# Patient Record
Sex: Male | Born: 1935 | Race: White | Hispanic: No | Smoking: Former smoker
Health system: Southern US, Community
[De-identification: ages and names within clinical notes are randomized; demographics above are authoritative.]

## PROBLEM LIST (undated history)

## (undated) DIAGNOSIS — E785 Hyperlipidemia, unspecified: Secondary | ICD-10-CM

## (undated) DIAGNOSIS — E039 Hypothyroidism, unspecified: Secondary | ICD-10-CM

## (undated) DIAGNOSIS — I10 Essential (primary) hypertension: Secondary | ICD-10-CM

## (undated) DIAGNOSIS — N400 Enlarged prostate without lower urinary tract symptoms: Secondary | ICD-10-CM

## (undated) DIAGNOSIS — H5440 Blindness, one eye, unspecified eye: Secondary | ICD-10-CM

## (undated) HISTORY — PX: JOINT REPLACEMENT: SHX530

## (undated) HISTORY — PX: HERNIA REPAIR: SHX51

## (undated) HISTORY — PX: OTHER SURGICAL HISTORY: SHX169

---

## 1999-11-16 ENCOUNTER — Ambulatory Visit (HOSPITAL_COMMUNITY): Admission: RE | Admit: 1999-11-16 | Discharge: 1999-11-16 | Payer: Self-pay | Admitting: Specialist

## 1999-12-28 ENCOUNTER — Encounter: Payer: Self-pay | Admitting: *Deleted

## 1999-12-28 ENCOUNTER — Inpatient Hospital Stay (HOSPITAL_COMMUNITY): Admission: EM | Admit: 1999-12-28 | Discharge: 1999-12-29 | Payer: Self-pay | Admitting: *Deleted

## 1999-12-29 ENCOUNTER — Encounter: Payer: Self-pay | Admitting: Internal Medicine

## 2000-11-24 ENCOUNTER — Ambulatory Visit (HOSPITAL_COMMUNITY): Admission: RE | Admit: 2000-11-24 | Discharge: 2000-11-24 | Payer: Self-pay | Admitting: *Deleted

## 2000-11-24 ENCOUNTER — Encounter (INDEPENDENT_AMBULATORY_CARE_PROVIDER_SITE_OTHER): Payer: Self-pay | Admitting: Specialist

## 2003-05-28 ENCOUNTER — Ambulatory Visit (HOSPITAL_COMMUNITY): Admission: RE | Admit: 2003-05-28 | Discharge: 2003-05-28 | Payer: Self-pay | Admitting: *Deleted

## 2003-05-28 ENCOUNTER — Encounter (INDEPENDENT_AMBULATORY_CARE_PROVIDER_SITE_OTHER): Payer: Self-pay | Admitting: Specialist

## 2004-12-29 ENCOUNTER — Emergency Department (HOSPITAL_COMMUNITY): Admission: EM | Admit: 2004-12-29 | Discharge: 2004-12-29 | Payer: Self-pay | Admitting: Emergency Medicine

## 2005-01-07 ENCOUNTER — Emergency Department (HOSPITAL_COMMUNITY): Admission: EM | Admit: 2005-01-07 | Discharge: 2005-01-07 | Payer: Self-pay | Admitting: Emergency Medicine

## 2005-08-24 ENCOUNTER — Ambulatory Visit (HOSPITAL_COMMUNITY): Admission: RE | Admit: 2005-08-24 | Discharge: 2005-08-24 | Payer: Self-pay | Admitting: *Deleted

## 2005-08-24 ENCOUNTER — Encounter (INDEPENDENT_AMBULATORY_CARE_PROVIDER_SITE_OTHER): Payer: Self-pay | Admitting: *Deleted

## 2007-08-29 ENCOUNTER — Inpatient Hospital Stay (HOSPITAL_COMMUNITY): Admission: RE | Admit: 2007-08-29 | Discharge: 2007-09-01 | Payer: Self-pay | Admitting: Orthopedic Surgery

## 2008-01-09 ENCOUNTER — Ambulatory Visit (HOSPITAL_COMMUNITY): Admission: RE | Admit: 2008-01-09 | Discharge: 2008-01-09 | Payer: Self-pay | Admitting: *Deleted

## 2009-10-14 ENCOUNTER — Inpatient Hospital Stay (HOSPITAL_COMMUNITY)
Admission: RE | Admit: 2009-10-14 | Discharge: 2009-10-16 | Payer: Self-pay | Source: Home / Self Care | Admitting: Orthopedic Surgery

## 2010-09-11 ENCOUNTER — Other Ambulatory Visit (HOSPITAL_COMMUNITY): Payer: Medicare Other

## 2010-09-11 ENCOUNTER — Ambulatory Visit (HOSPITAL_COMMUNITY)
Admission: RE | Admit: 2010-09-11 | Discharge: 2010-09-11 | Disposition: A | Discharge status: Home / Self Care | Payer: Medicare Other | Source: Ambulatory Visit | Attending: Orthopedic Surgery | Admitting: Orthopedic Surgery

## 2010-09-11 DIAGNOSIS — Z01818 Encounter for other preprocedural examination: Secondary | ICD-10-CM | POA: Insufficient documentation

## 2010-09-11 LAB — DIFFERENTIAL
Basophils Relative: 0 % (ref 0–1)
Eosinophils Absolute: 0.1 10*3/uL (ref 0.0–0.7)
Eosinophils Relative: 1 % (ref 0–5)
Monocytes Absolute: 0.6 10*3/uL (ref 0.1–1.0)
Monocytes Relative: 11 % (ref 3–12)

## 2010-09-11 LAB — BASIC METABOLIC PANEL
CO2: 30 mEq/L (ref 19–32)
Chloride: 105 mEq/L (ref 96–112)
Creatinine, Ser: 0.74 mg/dL (ref 0.4–1.5)
GFR calc Af Amer: >60 mL/min (ref >60)

## 2010-09-11 LAB — URINALYSIS, ROUTINE W REFLEX MICROSCOPIC
Ketones, ur: NEGATIVE mg/dL
Protein, ur: NEGATIVE mg/dL
Urine Glucose, Fasting: NEGATIVE mg/dL

## 2010-09-11 LAB — CBC
Hemoglobin: 13 g/dL (ref 13.0–17.0)
MCH: 29.5 pg (ref 26.0–34.0)
MCHC: 32.3 g/dL (ref 30.0–36.0)
Platelets: 204 10*3/uL (ref 150–400)

## 2010-09-11 LAB — SURGICAL PCR SCREEN: MRSA, PCR: NEGATIVE

## 2010-09-22 ENCOUNTER — Inpatient Hospital Stay (HOSPITAL_COMMUNITY): Payer: Medicare Other

## 2010-09-22 ENCOUNTER — Inpatient Hospital Stay (HOSPITAL_COMMUNITY)
Admission: RE | Admit: 2010-09-22 | Discharge: 2010-09-24 | DRG: 470 | Disposition: A | Discharge status: Home Health | Payer: Medicare Other | Attending: Orthopedic Surgery | Admitting: Orthopedic Surgery

## 2010-09-22 DIAGNOSIS — Z96659 Presence of unspecified artificial knee joint: Secondary | ICD-10-CM

## 2010-09-22 DIAGNOSIS — E119 Type 2 diabetes mellitus without complications: Secondary | ICD-10-CM | POA: Diagnosis present

## 2010-09-22 DIAGNOSIS — R5082 Postprocedural fever: Secondary | ICD-10-CM | POA: Diagnosis not present

## 2010-09-22 DIAGNOSIS — E039 Hypothyroidism, unspecified: Secondary | ICD-10-CM | POA: Diagnosis present

## 2010-09-22 DIAGNOSIS — E7800 Pure hypercholesterolemia, unspecified: Secondary | ICD-10-CM | POA: Diagnosis present

## 2010-09-22 DIAGNOSIS — M169 Osteoarthritis of hip, unspecified: Principal | ICD-10-CM | POA: Diagnosis present

## 2010-09-22 DIAGNOSIS — M1610 Unilateral primary osteoarthritis, unspecified hip: Principal | ICD-10-CM | POA: Diagnosis present

## 2010-09-22 DIAGNOSIS — I10 Essential (primary) hypertension: Secondary | ICD-10-CM | POA: Diagnosis present

## 2010-09-22 LAB — TYPE AND SCREEN
ABO/RH(D): A POS
Antibody Screen: NEGATIVE

## 2010-09-22 LAB — GLUCOSE, CAPILLARY: Glucose-Capillary: 98 mg/dL (ref 70–99)

## 2010-09-23 LAB — GLUCOSE, CAPILLARY
Glucose-Capillary: 292 mg/dL — ABNORMAL HIGH (ref 70–99)
Glucose-Capillary: 116 mg/dL — ABNORMAL HIGH (ref 70–99)

## 2010-09-23 LAB — BASIC METABOLIC PANEL
BUN: 13 mg/dL (ref 6–23)
Calcium: 8.3 mg/dL — ABNORMAL LOW (ref 8.4–10.5)
GFR calc non Af Amer: >60 mL/min (ref >60)
Glucose, Bld: 117 mg/dL — ABNORMAL HIGH (ref 70–99)

## 2010-09-23 LAB — CBC
HCT: 31.5 % — ABNORMAL LOW (ref 39.0–52.0)
MCH: 29.5 pg (ref 26.0–34.0)
MCHC: 32.4 g/dL (ref 30.0–36.0)
MCV: 91 fL (ref 78.0–100.0)
RDW: 13.9 % (ref 11.5–15.5)

## 2010-09-24 LAB — BASIC METABOLIC PANEL
BUN: 14 mg/dL (ref 6–23)
Calcium: 8.4 mg/dL (ref 8.4–10.5)
GFR calc non Af Amer: >60 mL/min (ref >60)
Potassium: 4.5 mEq/L (ref 3.5–5.1)
Sodium: 140 mEq/L (ref 135–145)

## 2010-09-24 LAB — CBC
MCHC: 32.8 g/dL (ref 30.0–36.0)
MCV: 91.1 fL (ref 78.0–100.0)
Platelets: 164 10*3/uL (ref 150–400)
RDW: 13.9 % (ref 11.5–15.5)
WBC: 8.7 10*3/uL (ref 4.0–10.5)

## 2010-09-24 LAB — GLUCOSE, CAPILLARY: Glucose-Capillary: 125 mg/dL — ABNORMAL HIGH (ref 70–99)

## 2010-09-28 NOTE — Op Note (Signed)
NAME:  Gene Hunter, Gene Hunter                ACCOUNT NO.:  [id number]  MEDICAL RECORD NO.:  [id number]           PATIENT TYPE:  I  LOCATION:  1619                         FACILITY:  [facility]  PHYSICIAN:  Madlyn Frankel. Charlann Boxer, M.D.  DATE OF BIRTH:  [birth year]  DATE OF PROCEDURE:  09/22/2010 DATE OF DISCHARGE:                              OPERATIVE REPORT   PREOPERATIVE DIAGNOSIS:  Right hip osteoarthritis.  POSTOPERATIVE DIAGNOSIS:  Right hip osteoarthritis.  PROCEDURE:  Right total hip replacement through an anterior approach.  COMPONENTS USED:  DePuy hip system size 54 pinnacle cup, 36 +4 AltrX liner, size 7 high Tri-Lock stem with 36 -2 sphere metal ball.  SURGEON:  Madlyn Frankel. Charlann Boxer, M.D.  ASSISTANT:  Nelia Shi. Webb Silversmith, nurse practitioner  ANESTHESIA:  General.  SPECIMENS:  None.  COMPLICATIONS:  None.  DRAINS:  One Hemovac.  BLOOD LOSS:  Approximately 500 cc.  INDICATIONS FOR PROCEDURE:  Mr. Papadopoulos is an 77 year old patient with known history of previous left total hip replacement that he has done very well with.  He had presented with recurring problems with his right hip similar to his left.  After recognizing the failure to respond to conservative measures, he wished at this point to proceed with hip replacement surgery.  Risks and benefits were reviewed in particular regarding the comparison of the anterior versus posterior approach. Consent was obtained for the benefit of pain relief.  PROCEDURE IN DETAIL:  The patient was brought to operative theater. Once adequate anesthesia, preoperative antibiotics, Ancef administered, the patient was positioned supine on the OSI Hana table.  The right lower extremity was prepped and draped from proximal to the iliac crest to the knee for anterolateral approach.  Fluoroscopy was brought to the field identifying landmarks and positioning.  A time-out was performed identifying the patient, planned procedure and the extremity.  At this  point, an incision was made 2 cm lateral to the anterior superior iliac spine extending anteriorly over the greater trochanter. A soft tissue plane was created and protractor placed.  The fascia of the tensor fascia lata muscle was incised, elevated, and the muscles swept laterally.  Cobb retractor was placed along the superior neck. Using hip retractors, I cauterized the anterior circumflex vessels and removed the peri-capsule anterior fat and placed another Cobb retractor over the inferior neck.  I then elevated the rectus femoris off the anterior acetabulum and then made a capsular osteotomy superior neck down to the trochanteric fossa and extended it down to the trochanteric groove.  Tag sutures were placed in the retractors and placed inside the capsule, at this point identified the position for a neck cut. Utilizing fluoroscopy, we confirmed this cut location.  Traction was applied and neck osteotomy made and the femoral head was then removed.  Traction was removed and slight internal rotation was applied. Retractor was placed anterior and posterior.  Labrum was debrided, foveal tissue identified and I began reaming with a 48 reamer in the central reaming spot.  Once I began my initial reaming, I brought the fluoroscopy in to confirm the location of reaming.  I reamed up to  53 reamer and chose a 54 cup.  The radiographic confirmation was utilized for reaming as well as final cup position.  The final cup was then impacted, a single cancellous screw placed in a hole and later the final 36 +4 neutral AltrX liner.  Attention was now directed to the femur.  The femur was first externally rotated to about 60 degrees allowing to release of the inferior capsule, then I rotated around to 120 degrees, releasing some of the posterior muscular structures to allow for retractor placement manually.  A retractor was then placed proximally over the trochanter.  I then used a box osteotome to open  up the proximal femur removing some of bone laterally to prevent any varus position of stem with the pituitary rongeur.  I began broaching using the Tri-Lock system.  With the initial broach, I did confirm the location radiographically.  Retractors were then placed and I finished broaching, went up to size 7 broach which sat 1 mm or so above my neck cut.  I did a trial reduction in high offset neck.  I had performed a contralateral hip with a size 8 high Tri-Lock stem, high offset Tri-Lock stem.  Trial reduction was carried out and when I looked at it radiographically, the right side, I feel that my cup position was a little bit lower down in the pelvis than the left hip and my lower neck cut can allow for position and appropriate leg lengths. Once I was satisfied with the stability of hip through range of motion as well as radiographically, the trial components were removed and final 7 high Tri-Lock stem was chosen.  It was subsequent impacted and sat at the level where my broach was.  Based on this and my trial reduction, I chose a 36 -2 ball.  It was impacted on a clean and dry trunnion and the hip reduced.  Hip had been irrigated throughout the case and again at this point, I reapproximated the anterior capsular tissue using #1 Vicryl.  I then placed a medium Hemovac drain for documentation of blood loss postoperative.  The patient received tranexamic acid perioperatively. The fascia of the tensor fascia lata was then reapproximated using #1 Vicryl.  The remaining wound was closed with 2-0 Vicryl and a running 4- 0 Monocryl.  The hip was cleaned, dried and dressed sterilely with Octylseal sealant and Aquacel dressing.  The drain site was dressed separately.  He was then extubated and brought to recovery room in stable condition tolerating the procedure well.     Madlyn Frankel Charlann Boxer, M.D.     MDO/MEDQ  D:  09/22/2010  T:  09/22/2010  Job:  [id number]  Electronically Signed by  Durene Romans M.D. on 09/28/2010 10:50:13 AM

## 2010-10-02 NOTE — Discharge Summary (Signed)
  NAME:  PAO, HAFFEY                ACCOUNT NO.:  [id number]  MEDICAL RECORD NO.:  [id number]           PATIENT TYPE:  I  LOCATION:  1619                         FACILITY:  [facility]  PHYSICIAN:  Madlyn Frankel. Charlann Boxer, M.D.  DATE OF BIRTH:  [birth year]  DATE OF ADMISSION:  09/22/2010 DATE OF DISCHARGE:  09/24/2010                              DISCHARGE SUMMARY   HISTORY:  This is a patient seen in followup some time for right hip pain.  He had failed conservative treatment, was brought to the hospital on the 14th and underwent a right hip arthroplasty.  ADMISSION DIAGNOSIS:  Right hip osteoarthritis.  DISCHARGE DIAGNOSIS:  Right hip osteoarthritis status post arthroplasty, hypertension, high cholesterol, diabetes.  DISCHARGE CONDITION:  Good.  His labs have been stable.  Today his hemoglobin and hematocrit are 10.4 and 31.7.  He is afebrile.  His vital signs are stable.  His wound is checked, it is clean and dry.  He has Aquacel dressing in place, he needs to remove that.  After 8 days, he can shower.  DISCHARGE INSTRUCTIONS:  Were given.  He knows he can shower, home health physical therapy.  DISCHARGE MEDICATIONS:  Are as follows: 1. Acetaminophen 325 mg two every 4 hours as needed. 2. Colace 100 mg twice daily. 3. Ferrous sulfate 325 mg three times a day. 4. Hydrocodone 7.5/325 mg one to two p.o. q.4-6 hours. 5. Methocarbamol 500 mg every 6 hours as needed. 6. MiraLax 17 g a day as needed. 7. Xarelto 10 mg a day. 8. Aspirin 81 mg a day after the Xarelto is finished. 9. Atorvastatin 20 mg every other day. 10.Levoxyl 100 mcg every morning. 11.Lisinopril 10 mg a day. 12.Metformin 500 mg at bedtime. 13.Multivitamins daily. 14.Refresh Tears as needed. 15.Amlodipine 5 mg at bedtime.     Russell L. Webb Silversmith, RN   ______________________________ Madlyn Frankel Charlann Boxer, M.D.    RLW/MEDQ  D:  09/24/2010  T:  09/25/2010  Job:  [id number]  Electronically Signed by Lauree Chandler NP-C on  10/01/2010 01:22:07 PM Electronically Signed by Durene Romans M.D. on 10/02/2010 07:03:21 AM

## 2010-10-29 NOTE — H&P (Signed)
NAME:  Gene Hunter, Gene Hunter                ACCOUNT NO.:  [id number]  MEDICAL RECORD NO.:  [id number]          PATIENT TYPE:  INP  LOCATION:  NA                           FACILITY:  [facility]  PHYSICIAN:  Madlyn Frankel. Charlann Boxer, M.D.  DATE OF BIRTH:  Nov 04, 1925  DATE OF ADMISSION: DATE OF DISCHARGE:                             HISTORY & PHYSICAL   ADMISSION DIAGNOSIS:  Right hip osteoarthritis.  BRIEF HISTORY:  This is a patient seen and followed here for some time. He has had surgery with Dr. Charlann Boxer before and after failed conservative treatment has decided to move forward with right hip arthroplasty anterior approach scheduled for February 14.  PAST MEDICAL HISTORY:  Significant for hypertension.  He has high cholesterol and diabetes, which is well-controlled.  PAST SURGICAL HISTORY:  He gives a surgical history of having left eye surgery in 1970.  He had a hernia four different times on the left side. He had a right knee replacement in 2010.  CURRENT MEDICATIONS: 1. Vitamins daily. 2. Aspirin 81 mg every other day. 3. Zolpidem every night. 4. Lisinopril 10 mg a day. 5. Avastin 20 mg a day. 6. Metformin 500 mg every night. 7. Levoxyl 100 mg every day.  ALLERGIES:  HE DOES NOT DESCRIBE ANY MEDICINE ALLERGIES.  SOCIAL HISTORY:  The patient is widowed.  He lives alone.  He is retired from the [employer].  His disposition plan is for home with his daughter after surgery.  He has no history of alcohol or drug abuse.  He does have a distant history of smoking.  The patient has two children.  FAMILY HISTORY:  His father died in 45 from heart problems.  His mother died in 74 from a stroke.  REVIEW OF SYSTEMS:  Notable for those difficulties described in the history of present illness and past medical history.  His review of systems sheet is otherwise unremarkable.  PHYSICAL EXAMINATION:  VITAL SIGNS:  The patient is 5 feet 11 inches, 167 pounds.  His blood pressure is 110/60.  His  respiration rate is 20. His pulse is 68. GENERAL HEALTH:  Fair.  He does have diabetes. HEENT:  Shows him to be normocephalic.  He is blind in his left eye. NECK:  Within normal limits. CHEST:  Clear to auscultation bilaterally. HEART:  He has S1, S2.  He has no murmurs, rubs or gallops heard.  He does have a history of hypertension and high cholesterol. ABDOMEN:  Soft and nondistended. GENITOURINARY:  Unremarkable. EXTREMITIES:  Exam shows osteoarthritis. DERMATOLOGIC:  He is intact. NEUROLOGIC:  He is intact.  LABORATORY DATA:  Pending through [facility].  RADIOLOGIC:  EKG and chest x-ray are pending through [facility].  IMPRESSION:  Right hip osteoarthritis.  PLAN:  He will be admitted on February 14 for a right hip anterior approach arthroplasty with Dr. Charlann Boxer.  DISCHARGE MEDICATIONS:  Including Xarelto, Robaxin, MiraLax, Colace and iron were given to him today.  His pain medicine will be given to him at discharge.     Russell L. Blima Dessert   ______________________________ Madlyn Frankel Charlann Boxer, M.D.    RLW/MEDQ  D:  09/10/2010  T:  09/10/2010  Job:  [id number]  Electronically Signed by Lauree Chandler NP-C on 10/29/2010 10:12:37 AM Electronically Signed by Durene Romans M.D. on 10/29/2010 01:31:10 PM

## 2010-11-01 LAB — DIFFERENTIAL
Basophils Relative: 0 % (ref 0–1)
Eosinophils Absolute: 0.2 10*3/uL (ref 0.0–0.7)
Eosinophils Relative: 3 % (ref 0–5)
Neutrophils Relative %: 63 % (ref 43–77)

## 2010-11-01 LAB — GLUCOSE, CAPILLARY
Glucose-Capillary: 103 mg/dL — ABNORMAL HIGH (ref 70–99)
Glucose-Capillary: 108 mg/dL — ABNORMAL HIGH (ref 70–99)
Glucose-Capillary: 124 mg/dL — ABNORMAL HIGH (ref 70–99)
Glucose-Capillary: 137 mg/dL — ABNORMAL HIGH (ref 70–99)
Glucose-Capillary: 90 mg/dL (ref 70–99)
Glucose-Capillary: 97 mg/dL (ref 70–99)

## 2010-11-01 LAB — PROTIME-INR: Prothrombin Time: 13.4 seconds (ref 11.6–15.2)

## 2010-11-01 LAB — BASIC METABOLIC PANEL
BUN: 16 mg/dL (ref 6–23)
BUN: 31 mg/dL — ABNORMAL HIGH (ref 6–23)
CO2: 29 mEq/L (ref 19–32)
CO2: 30 mEq/L (ref 19–32)
Calcium: 8 mg/dL — ABNORMAL LOW (ref 8.4–10.5)
Chloride: 102 mEq/L (ref 96–112)
Chloride: 105 mEq/L (ref 96–112)
Chloride: 108 mEq/L (ref 96–112)
Creatinine, Ser: 0.38 mg/dL — ABNORMAL LOW (ref 0.4–1.5)
Creatinine, Ser: 0.8 mg/dL (ref 0.4–1.5)
Creatinine, Ser: 0.92 mg/dL (ref 0.4–1.5)
GFR calc Af Amer: >60 mL/min (ref >60)
GFR calc Af Amer: >60 mL/min (ref >60)
GFR calc non Af Amer: >60 mL/min (ref >60)
Glucose, Bld: 100 mg/dL — ABNORMAL HIGH (ref 70–99)
Potassium: 4.3 mEq/L (ref 3.5–5.1)
Potassium: 5.4 mEq/L — ABNORMAL HIGH (ref 3.5–5.1)

## 2010-11-01 LAB — CBC
HCT: 29.7 % — ABNORMAL LOW (ref 39.0–52.0)
HCT: 39.6 % (ref 39.0–52.0)
Hemoglobin: 9.8 g/dL — ABNORMAL LOW (ref 13.0–17.0)
MCHC: 32.9 g/dL (ref 30.0–36.0)
MCHC: 33.5 g/dL (ref 30.0–36.0)
MCV: 90.5 fL (ref 78.0–100.0)
MCV: 91.8 fL (ref 78.0–100.0)
MCV: 92 fL (ref 78.0–100.0)
Platelets: 147 10*3/uL — ABNORMAL LOW (ref 150–400)
Platelets: 236 10*3/uL (ref 150–400)
RBC: 2.58 MIL/uL — ABNORMAL LOW (ref 4.22–5.81)
RBC: 3.23 MIL/uL — ABNORMAL LOW (ref 4.22–5.81)
RDW: 14.1 % (ref 11.5–15.5)
WBC: 10.8 10*3/uL — ABNORMAL HIGH (ref 4.0–10.5)
WBC: 6 10*3/uL (ref 4.0–10.5)
WBC: 7.2 10*3/uL (ref 4.0–10.5)

## 2010-11-01 LAB — URINALYSIS, ROUTINE W REFLEX MICROSCOPIC
Bilirubin Urine: NEGATIVE
Glucose, UA: NEGATIVE mg/dL
Ketones, ur: NEGATIVE mg/dL
Protein, ur: NEGATIVE mg/dL
pH: 6 (ref 5.0–8.0)

## 2010-11-01 LAB — TYPE AND SCREEN
ABO/RH(D): A POS
Antibody Screen: NEGATIVE

## 2010-11-01 LAB — APTT: aPTT: 29 seconds (ref 24–37)

## 2010-12-22 NOTE — Op Note (Signed)
NAME:  Gene Hunter, Gene Hunter                ACCOUNT NO.:  [id number]   MEDICAL RECORD NO.:  [id number]          PATIENT TYPE:  AMB   LOCATION:  ENDO                         FACILITY:  [facility]   PHYSICIAN:  Georgiana Spinner, M.D.    DATE OF BIRTH:  Mar 03, 1926   DATE OF PROCEDURE:  DATE OF DISCHARGE:                               OPERATIVE REPORT   PROCEDURE:  Colonoscopy.   INDICATIONS:  1. Colon cancer screening.  2. Colon polyps.   ANESTHESIA:  Fentanyl 100 mcg, Versed 10 mg.   DESCRIPTION OF PROCEDURE:  With the patient mildly sedated in the left  lateral decubitus position, the Pentax videoscopic endoscope was  inserted in the rectum after a rectal exam revealed an enlarged but  symmetrical non-nodular prostate.  The endoscope was passed under direct  vision with pressure applied to reach the cecum, identified by the  ileocecal valve and appendiceal orifice, both of which were  photographed.  From this point, the colonoscope was slowly withdrawn  taking circumferential views of the colonic mucosa, stopping in the  rectum which appeared normal on direct and showed hemorrhoids in  retroflexed view.  The endoscope was straightened and withdrawn.  The  patient's vital signs and pulse oximeter remained stable.  The patient  tolerated the procedure well without apparent complications.   FINDINGS:  Internal hemorrhoids, otherwise fairly unremarkable  examination.   PLAN:  Repeat examination in 5 years.           ______________________________  Georgiana Spinner, M.D.     GMO/MEDQ  D:  01/09/2008  T:  01/09/2008  Job:  [id number]

## 2010-12-22 NOTE — Op Note (Signed)
NAME:  Gene Hunter, CHAVOUS                ACCOUNT NO.:  [id number]   MEDICAL RECORD NO.:  [id number]          PATIENT TYPE:  INP   LOCATION:  0006                         FACILITY:  [facility]   PHYSICIAN:  Madlyn Frankel. Charlann Boxer, M.D.  DATE OF BIRTH:  12/23/1925   DATE OF PROCEDURE:  08/29/2007  DATE OF DISCHARGE:                               OPERATIVE REPORT   PREOPERATIVE DIAGNOSIS:  Left hip osteoarthritis.   POSTOPERATIVE DIAGNOSIS:  Left hip osteoarthritis.   PROCEDURE:  Left total hip replacement.   COMPONENTS USED:  DePuy hip system with size 52 pinnacle cup, two  cancellous bone screws.  A 36 +4 neutral marathon liner, a Tri-lock size  8 high offset stem with a 36 +5 ball.   SURGEON:  Madlyn Frankel. Charlann Boxer, M.D.   ASSISTANT:  Dwyane Luo, PA-C   ANESTHESIA:  General.   BLOOD LOSS:  250 mL.   DRAINS:  Times one.   COMPLICATIONS:  None.   INDICATIONS FOR PROCEDURE:  Mr. Pucci is a 77 year old gentleman who  presented to the office with radiographic findings consistent with end-  stage left hip osteoarthritis that failed conservative measures.  Discussed treatment options.  Surgery discussed including risk of  infection, DVT, neurovascular injury, dislocation, component failure,  need for revision surgery.  Risks and benefits discussed.  Consent  obtained.   PROCEDURE IN DETAIL:  The patient was brought to the operative theater.  Once adequate anesthesia, preoperative antibiotics administered the  patient was positioned in the right lateral decubitus position left side  up.  The left upper lower extremity pre scrubbed and prepped and draped  in sterile fashion.  A lateral based incision was made for posterior  approach to the iliotibial band and gluteus fascia were incised  posteriorly.  The short external rotators were identified and taken down  separate from the posterior capsule.  An L capsulotomy was performed and  the posterior capsule was then preserved for later anatomic repair  as  well as protection against the sciatic nerve from retractors.  The  femoral head was dislocated.  Based off anatomic landmarks, the neck  osteotomy was made with an oscillating saw, the femoral head removed.  Attention was first directed to the femur.  The femur was exposed using  a box osteotome.  I initiated a the canal opening, used a hand reamer  once, then irrigated the canal to prevent fat emboli with broaching  I  began broaching with a 1 and carried the broach all way up initially to  a size 7, finished off the neck cut with a calcar miller.  Attention was  now directed to the acetabulum.  I packed off the femur to prevent any  significant bleeding during this time.   Acetabular exposure obtained, labrectomy carried out.  I began reaming  with a 44 reamer and carried up to 51 reamer.  I removed some  osteophytes off the posterior and anterior aspect of the acetabulum.  I  then impacted a 52 pinnacle cup.  The cup position was approximately a  40 degrees of abduction and 20 degrees  forward flexion, positioned  anatomically within the confines of the acetabulum.  Based on the  position,  I went ahead and placed two cancellous bone screws.  This  supported an excellent initial scratch fit.  A neutral 36+4 trial liner  was placed.  Attention was redirected back to the femur.  Upon placing a  7 broach, felt that it impacted a little bit more beneath my neck cut,  so I went up to a size 8.  This sat up level of my neck cut.  Trial  reduction was carried out of high offset neck and a +1.5 ball.  The  patient was noted to have some hypertrophic anterior capsule which I was  able to debride because there was a little bit of impingement with  internal rotation.  Leg lengths appeared to be within limits of each  other; however, I did choose to go up to the +5 ball because it helped  to prevent some impingement anteriorly with this thickened capsule.   At this point all trial components  removed, the central hole eliminator  was placed and the final 36+ 4 neutral Marathon liner was impacted into  the shell.  The final 8 high offset femoral stem was then impacted to  level where the broach was and because of this I used a 36 +5 ball.  The  hip was reduced.  At this point the hip was irrigated again and  throughout the case it was.  The posterior capsule was reapproximated to  the superior leaflet using #1 Ethibond.  #1 Ethibond was used on the  iliotibial band and the gluteal fascia was run with #1 Vicryl.  Remaining wound was closed with 2-0 Vicryl and 4-0 Monocryl.  The hip  was then cleaned, dried and dressed sterilely with Steri-Strips and  sterile dressing.  He is brought to recovery room in stable condition.  Tolerated procedure well.      Madlyn Frankel Charlann Boxer, M.D.  Electronically Signed     MDO/MEDQ  D:  08/29/2007  T:  08/30/2007  Job:  [id number]

## 2010-12-22 NOTE — H&P (Signed)
NAME:  Gene Hunter, Gene Hunter                ACCOUNT NO.:  [id number]   MEDICAL RECORD NO.:  [id number]          PATIENT TYPE:  INP   LOCATION:  NA                           FACILITY:  [facility]   PHYSICIAN:  Madlyn Frankel. Charlann Boxer, M.D.  DATE OF BIRTH:  1926-01-06   DATE OF ADMISSION:  08/29/2007  DATE OF DISCHARGE:                              HISTORY & PHYSICAL   PROCEDURE:  Left total hip replacement.   CHIEF COMPLAINT:  Left hip pain.   HISTORY OF PRESENT ILLNESS:  This is a 77 year old male with a history  of left hip and groin pain secondary to osteoarthritis.  It has been  refractory to all conservative treatment.  He has recently been to his  primary care for evaluation and presurgical clearance and was found have  some lower extremity circulatory issues.  He was to be seen by Dr. Renne Crigler  for a left lower extremity circulatory vascular check.  I am still  awaiting the results of this prior to surgery.  Otherwise, stable for  surgery.   PAST MEDICAL HISTORY:  Significant for:  1. Osteoarthritis.  2. Hypertension.  3. Dyslipidemia.  4. Diabetes.  5. BPH.   PAST SURGICAL HISTORY:  1. Hernia repair x3.  2. Eye surgeries, 2 transplants into left eye.  3. Cataract, right eye.   FAMILY HISTORY:  Heart disease and stroke.   SOCIAL HISTORY:  He is widowed.  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  Primary caregiver will be his daughters and son after surgery.   DRUG ALLERGIES:  NO KNOWN DRUG ALLERGIES.  He does have some food  allergies to MILK and SOME DAIRY FOODS.   MEDICATIONS:  1. Multivitamin daily.  2. Aspirin 81 p.o. daily.  3. Lisinopril/hydrochlorothiazide 20/12.5 one p.o. daily.  4. Levoxyl 100 mg one p.o. daily.  5. Metformin 250 mg, verify dosage frequency with the patient.  6. Lipitor, unknown dosage, amount of frequency, amount verified with      patient.   REVIEW OF SYSTEMS:  PERIPHERAL VASCULAR:  Left lower extremity vascular  study pending.  Otherwise, see HPI.   PHYSICAL EXAM:  VITAL SIGNS:  Pulse 72, respirations 18, blood pressure  126/60.  GENERAL:  Awake, alert and oriented, well-developed, well-nourished, in  no acute distress.  NECK:  Supple.  No carotid bruits.  CHEST:  Lungs are clear to auscultation bilaterally.  BREASTS:  Deferred.  HEART:  Regular rate and rhythm.  S1-S2 distinct.  ABDOMEN:  Soft, nontender, nondistended.  Bowel sounds present.  GENITOURINARY:  Deferred.  EXTREMITIES:  Left lower extremity:  He does have stiff hip range of  motion, negative dorsalis pedis pulse.  SKIN:  Intact.  No cellulitis.  NEUROLOGIC:  Intact distal sensibilities.   LABORATORY AND ACCESSORY CLINICAL DATA:  Labs, EKG, chest x-ray all  pending presurgical testing.   IMPRESSION:  Left hip osteoarthritis.   PLAN OF ACTION:  Left total hip replacement at [facility],  August 29, 2007, by surgeon, Dr. Durene Romans.  The risks and  complications were discussed.   Postoperative medications were not provided at  time of history and  physical and will be provided at time of hospital admission and decision  about his disposition.  Pain medicines will provided at time of surgery  as well.     ______________________________  Yetta Glassman. [address], [place]      Madlyn Frankel. Charlann Boxer, M.D.  Electronically Signed    BLM/MEDQ  D:  08/25/2007  T:  08/25/2007  Job:  [id number]   cc:   Soyla Murphy. Renne Crigler, M.D.  Fax: [phone]   Excell Seltzer. Annabell Howells, M.D.  Fax: [phone]   Amy Y. [place], M.D.  Fax: [phone]   Durward Fortes MD

## 2010-12-25 NOTE — Discharge Summary (Signed)
NAME:  Gene Hunter, Gene Hunter                ACCOUNT NO.:  [id number]   MEDICAL RECORD NO.:  [id number]          PATIENT TYPE:  INP   LOCATION:  1616                         FACILITY:  [facility]   PHYSICIAN:  Madlyn Frankel. Charlann Boxer, M.D.  DATE OF BIRTH:  12-12-1925   DATE OF ADMISSION:  08/29/2007  DATE OF DISCHARGE:  09/01/2007                               DISCHARGE SUMMARY   ADMISSION DIAGNOSES:  1. Osteoarthritis.  2. Diabetes.  3. Hypothyroidism.  4. Hypertension.  5. Dyslipidemia.   DISCHARGE DIAGNOSES:  1. Osteoarthritis.  2. Diabetes.  3. Hypothyroidism.  4. Hypertension.  5. Dyslipidemia.   HISTORY OF PRESENT ILLNESS:  Gene Hunter is a very pleasant 77 year old  gentleman with a history of left hip and groin pain secondary to  osteoarthritis which was refractory to all conservative treatment.  Presurgically, he was assessed for left total hip replacement.   CONSULTATIONS:  None.   PROCEDURE:  Left total hip arthroplasty by surgeon Dr. Durene Romans,  assistant Dwyane Luo, PA-C. Components used were metal on metal.   LABORATORY DATA:  Preadmission CBC with hematocrit 41.7, at discharge  27.3 and stable with platelet count at 165,000.  White cell differential  normal.  Coag's negative and normal.  Preadmission chemistries showed  potassium 4.1, sodium 142, glucose 104; tracked throughout and at  discharge stable, sodium 139, potassium 3.9, glucose 120, creatinine  0.8.  Chemistry function of kidneys showed GFR greater than 60, at  discharge calcium 8.1.  Urinalysis was negative.   Radiology:  Chest, two view:  No acute disease.   Cardiology:  Electrocardiogram with normal sinus rhythm.   HOSPITAL COURSE:  The patient underwent left total hip replacement and  tolerated the procedure well.  He was admitted to the orthopedic floor.  On postoperative day #1 was sore with no complaints, afebrile.  Did have  mild bump up in his creatinine.  His  Hemovac was pulled.  He was  neurovascularly  intact.  Held his blood pressure medications.  Initial  evaluation recommended home health care physical therapy.  Did well.  This was discussed with family and approved.   On postoperative day #2, no complaints.  He was hoping to go home the  next day.  His creatinine function was back down to 0.8.  All others  normal.  His dressing was changed.  It was clean, dry and intact.  He  was neurovascularly intact to his left lower extremity.  He was making  very good progress with his physical therapy.  He was weight-bearing as  tolerated.  He was maintaining good fluid intake.  Continued to make  progress with physical therapy twice per day.   Patient was seen on day #3 with good progress.  No complaints.  He had  not had a bowel movement but he did have positive flatus and was  afebrile.  Dressing was changed again.  Wound had no active drainage.  He was neurovascularly intact.  He was weight-bearing as tolerated and  making excellent progress and he was ready for discharge with home  healthcare, physical therapy and Lovenox  teaching kit.   DISCHARGE DISPOSITION:  Discharged home with home health physical  therapy and Lovenox teaching, stable and in improved condition.   DISCHARGE DIET:  Regular.   DISCHARGE WOUND CARE:  Keep dry.   DISCHARGE ACTIVITY:  Weight-bearing as tolerated with use of a rolling  walker.   DISCHARGE MEDICATIONS:  1. Lovenox 40 mg subcutaneously q.24h. x 11 days.  2. Robaxin 500 mg p.o. q.6h.  3. Aspirin 325 mg one p.o. daily x4 weeks after Lovenox completed.  4. Iron 325 mg one p.o. t.i.d. x3 weeks.  5. Colace 100 mg one p.o. b.i.d.  6. MiraLax 17 grams p.o. daily.  7. Vicodin 5/325 mg one to two p.o. q.4-6h. p.r.n. pain.   HOME MEDICATIONS:  1. Multivitamin one p.o. daily.  2. Levoxyl 100 mcg one p.o. q.a.m.  3. Aspirin 81 mg one p.o. daily after deep venous thrombosis      prophylaxis is completed.  4. Lisinopril/hydrochlorothiazide 20/12.5 one p.o.  q.a.m.  5. Metformin 250 mg one p.o. Q. evening.  6. Lipitor 5 mg one every other day.   DISCHARGE FOLLOWUP:  Followup with Dr. Charlann Boxer at [phone] in 10 to 14  days.     ______________________________  Yetta Glassman [address], [place]      Madlyn Frankel. Charlann Boxer, M.D.  Electronically Signed   BLM/MEDQ  D:  10/31/2007  T:  10/31/2007  Job:  [id number]

## 2010-12-25 NOTE — Op Note (Signed)
[place]. [facility]  Patient:    Gene Hunter, Gene Hunter                       MRN: [id number] Proc. Date: 11/16/99 Adm. Date:  [id number] Disc. Date: [id number] Attending:  Mick Sell                           Operative Report  PREOPERATIVE DIAGNOSIS:  Cataract - OD.  POSTOPERATIVE DIAGNOSIS:  Cataract - OD.  PROCEDURE:  Cataract extraction with intraocular lens implant - OD.  INDICATIONS:  The patient is a 77 year old male with painless, progressive decrease in vision so that he can no longer see for driving and reading.  On examination, he was found to have a dense nuclear sclerotic and cortical cataract consistent with a decrease in visual acuity.  SURGEON:  Chucky May, M.D.  PROCEDURE:  The patient was brought to the main operating room and placed in supine position.  Anesthesia was obtained by means of topical 4% lidocaine drops with tetracaine.  He was then prepped and draped in usual manner.  A lid speculum was inserted and the cornea was entered with a diamond keratome superiorly with additional port superotemporally.  Occucoat was instilled and an anterior capsulorrhexis was performed without difficulty.  The nucleus was mobilized by hydrodissection followed by phacoemulsification of the nucleus and removal of residual cortical material by irrigation and aspiration.  The posterior capsule was polished and a posterior chamber lens implant was placed in the bag without difficulty.  Occucoat was removed and replaced with balanced salt solution. The wound was hydrated with balanced salt solution and checked for fluid leaks, but  none were noted.  The eye was dressed with topical Pred Forte, Ocuflox, Voltaren and a Fox shield and the patient was taken to recovery room in excellent condition, where he received written and verbal instructions for his postoperative care and was scheduled for a follow-up in 24 hours. DD:  12/02/99 TD:   12/02/99 Job: [id number]

## 2010-12-25 NOTE — Op Note (Signed)
   NAME:  Gene Hunter, Gene Hunter                          ACCOUNT NO.:  [id number]   MEDICAL RECORD NO.:  [id number]                   PATIENT TYPE:  AMB   LOCATION:  ENDO                                 FACILITY:  [facility]   PHYSICIAN:  Georgiana Spinner, M.D.                 DATE OF BIRTH:  02-29-1926   DATE OF PROCEDURE:  05/28/2003  DATE OF DISCHARGE:                                 OPERATIVE REPORT   PROCEDURE:  Upper endoscopy.   INDICATIONS FOR PROCEDURE:  Hemoccult positivity.   ANESTHESIA:  Demerol 40, Versed 6 mg.   PROCEDURE:  With the patient mildly sedated in the left lateral decubitus  position the Olympus videoscopic endoscope was inserted into the mouth and  passed under direct vision through the esophagus which appeared  normal.   We entered into the stomach. The fundus body, antrum, duodenal bulb and 2nd  portion of the duodenum all appeared  normal. Representative photographs  were taken. From this point the endoscope was slowly withdrawn, taking  circumferential views of the duodenal mucosa until the endoscope had been  pulled back into the stomach. It was placed in retroflexion to view the  stomach  from below. The endoscope was straightened and withdrawn taking  circumferential views of the remaining gastric and esophageal mucosa.   The patient's vital signs and pulse oximeter remained stable. The patient  tolerated the procedure well without apparent complications.   FINDINGS:  Unremarkable examination.   PLAN:  Proceed to colonoscopy.                                               Georgiana Spinner, M.D.    GMO/MEDQ  D:  05/28/2003  T:  05/28/2003  Job:  [id number]

## 2010-12-25 NOTE — Procedures (Signed)
[facility]  Patient:    Gene Hunter, Gene Hunter                       MRN: [id number] Proc. Date: 11/24/00 Adm. Date:  [id number] Attending:  Sabino Gasser                           Procedure Report  PROCEDURE:  Colonoscopy.  INDICATION FOR PROCEDURE:  Rectal bleeding.  ANESTHESIA:  Demerol 80, Versed 8 mg.  DESCRIPTION OF PROCEDURE:  With the patient mildly sedated in the left lateral decubitus position, the Olympus videoscopic colonoscope was inserted in the rectum and passed under direct vision to the cecum identified by the ileocecal valve and appendiceal orifice both of which were photographed. From this point, the colonoscope was slowly withdrawn taking circumferential views of the entire colonic mucosa, stopping in the sigmoid colon to photograph diverticula seen along the way until we reached the rectum where a polyp was seen, photographed and removed using snare cautery technique on a setting of 20:20 blended current. The endoscope was then pulled back all the way to the rectum which appeared otherwise normal in direct and showed internal hemorrhoids on retroflexed view. The endoscope was straightened and withdrawn. The patients vital signs and pulse oximeter remained stable. The patient tolerated the procedure well without apparent complications.  FINDINGS:  Internal hemorrhoids, diverticulosis of sigmoid colon, polyp of rectum. Await biopsy report. The patient will call me for results and followup with me in as an outpatient. DD:  11/24/00 TD:  11/24/00 Job: [id number]

## 2010-12-25 NOTE — Op Note (Signed)
   NAME:  Gene Hunter, Gene Hunter                          ACCOUNT NO.:  [id number]   MEDICAL RECORD NO.:  [id number]                   PATIENT TYPE:  AMB   LOCATION:  ENDO                                 FACILITY:  [facility]   PHYSICIAN:  Georgiana Spinner, M.D.                 DATE OF BIRTH:  May 10, 1926   DATE OF PROCEDURE:  05/28/2003  DATE OF DISCHARGE:                                 OPERATIVE REPORT   PROCEDURE PERFORMED:  Colonoscopy.   ENDOSCOPIST:  Georgiana Spinner, M.D.   INDICATIONS FOR PROCEDURE:  Hemoccult positivity.   ANESTHESIA:  Demerol 60 mg.   DESCRIPTION OF PROCEDURE:  With the patient mildly sedated in the left  lateral decubitus position, the Olympus video colonoscope was inserted in  the rectum and passed under direct vision to the cecum, identified by the  ileocecal valve and appendiceal orifice, both of which were photographed.  From this point the colonoscope was slowly withdrawn taking circumferential  views of the colonic mucosa stopping only to photograph diverticula seen  along the way in the sigmoid colon until we reached 40 cm from the anal  verge, at which point a small polyp was seen and removed using hot biopsy  forceps technique setting of 20/20 blended current.  We pulled back all the  way to the rectum which appeared normal on direct and showed hemorrhoids on  retroflex view.  The endoscope was straightened and withdrawn.  The  patient's vital signs and pulse oximeter remained stable.  The patient  tolerated the procedure well without apparent complication.   FINDINGS:  Diverticulosis, mild of sigmoid colon.  Polyp of sigmoid colon at  40 cm from the anal verge and internal hemorrhoids.   PLAN:  Await biopsy report.  Patient will call me for results and follow-up  with me as an outpatient.                                                 Georgiana Spinner, M.D.    GMO/MEDQ  D:  05/28/2003  T:  05/28/2003  Job:  [id number]

## 2010-12-25 NOTE — H&P (Signed)
NAME:  DIANNA, DESHLER                ACCOUNT NO.:  [id number]   MEDICAL RECORD NO.:  [id number]          PATIENT TYPE:  INP   LOCATION:  1616                         FACILITY:  [facility]   PHYSICIAN:  Madlyn Frankel. Charlann Boxer, M.D.  DATE OF BIRTH:  Dec 17, 1925   DATE OF ADMISSION:  08/29/2007  DATE OF DISCHARGE:  09/01/2007                              HISTORY & PHYSICAL   PROCEDURE:  Left total hip arthroplasty.   CHIEF COMPLAINT:  Left hip pain.   HISTORY OF PRESENT ILLNESS:  A 77 year old male with a history of left  hip and groin pain secondary to osteoarthritis.  He has been refractory  to all conservative treatment.  Presurgical assessed for a left total  hip replacement.   PAST MEDICAL HISTORY:  1. Osteoarthritis.  2. Hypertension.  3. Hypothyroidism.  4. Diabetes.  5. Dyslipidemia.   PAST SURGICAL HISTORY:  1. Hernia repair x3.  2. 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  3. Bilateral carpal tunnel release.   FAMILY HISTORY:  Heart attack, stroke.   SOCIAL HISTORY:  He is a widow.  Primary caregiver after surgery will be  daughter and son.   DRUG ALLERGIES:  NO KNOWN DRUG ALLERGIES.   MEDICATIONS:  1. Lisinopril/HCTZ 20/12.5, one p.o. daily.  2. Levoxyl 100 mcg one p.o. daily.  3. Metformin 250 mg, one p.o. daily.  4. Lipitor 5 mg, one p.o. q.a.m.  5. Aspirin 81 mg, one p.o. daily.  6. Multivitamin p.o. daily.   REVIEW OF SYSTEMS:  For review of systems, see HPI.   PHYSICAL EXAMINATION:  VITAL SIGNS:  Pulse 72, respirations 18, blood  pressure 126/60.  GENERAL:  Awake, alert and oriented, well-developed, well-nourished, no  acute distress.  NECK.  Supple.  No carotid bruits.  CHEST/LUNGS:  Clear to auscultation bilaterally.  BREASTS:  Deferred.  HEART:  Regular rate and rhythm.  S1-S2 distinct.  ABDOMEN:  Soft and bowel sounds present.  GENITOURINARY:  Deferred.  EXTREMITIES:  Left lower extremity has increased groin pain and stiff  with  range of motion.  Negative dorsalis pedis pulse.  SKIN:  No cellulitis.  NEUROLOGIC:  Intact distal sensibilities.   Labs, EKG, chest x-ray all pending presurgical testing.   IMPRESSION:  Left hip osteoarthritis.   PLAN OF ACTION:  Left total hip arthroplasty by surgeon Dr. Durene Romans.  Risks, complications were discussed.     ______________________________  Yetta Glassman [address], [place]      Madlyn Frankel. Charlann Boxer, M.D.  Electronically Signed    BLM/MEDQ  D:  10/31/2007  T:  10/31/2007  Job:  [id number]

## 2010-12-25 NOTE — Op Note (Signed)
NAME:  Gene Hunter, Gene Hunter                ACCOUNT NO.:  [id number]   MEDICAL RECORD NO.:  [id number]          PATIENT TYPE:  AMB   LOCATION:  ENDO                         FACILITY:  [facility]   PHYSICIAN:  Georgiana Spinner, M.D.    DATE OF BIRTH:  [birth year]   DATE OF PROCEDURE:  08/24/2005  DATE OF DISCHARGE:                                 OPERATIVE REPORT   PROCEDURE:  Colonoscopy.   INDICATIONS:  Colon polyps.   ANESTHESIA:  Demerol 60, Versed 6 milligrams.   PROCEDURE:  With the patient mildly sedated in the left lateral decubitus  position, a rectal exam was performed which was unremarkable. Subsequently  the Olympus videoscopic colonoscope was inserted in the rectum, passed under  direct vision to the cecum identified by ileocecal valve and appendiceal  orifice, both which were photographed.  From this point, the colonoscope was  slowly withdrawn taking circumferential views of colonic mucosa as we  withdrew all the way to the rectum, stopping in hepatic flexure where a  small polyp was seen, photographed, and removed using hot biopsy forceps  technique, setting of 20/200 blended current. We next stopped in the sigmoid  colon where significant diverticulosis was noted and photographed. The  endoscope was withdrawn all the way to the rectum which appeared normal on  direct and retroflexed view.  The endoscope was straightened and withdrawn.  The patient's vital signs and pulse oximeter remained stable. The patient  tolerated procedure well without apparent complication.   FINDINGS:  A small polyp at the hepatic flexure, diverticulosis of the  sigmoid colon, otherwise unremarkable examination.   PLAN:  Await biopsy report. The patient will call me for results and follow-  up with me as an outpatient.           ______________________________  Georgiana Spinner, M.D.     GMO/MEDQ  D:  08/24/2005  T:  08/24/2005  Job:  [id number]

## 2010-12-25 NOTE — H&P (Signed)
[place]. [facility]  Patient:    Gene Hunter, Gene Hunter                         MRN: [id number] Adm. Date:  12/28/99 Attending:  Peter M. [place], M.D. CC:         Soyla Murphy. Renne Crigler, M.D.                         History and Physical  CHIEF COMPLAINT: Epigastric pain.  HISTORY OF PRESENT ILLNESS: Mr. Lohse is a 77 year old white male with a history of hypertension and family history of early coronary disease, who presents with prolonged chest and epigastric pain this morning.  The pain actually began at 10 p.m. last night and was epigastric in location without radiation, nausea, vomiting, diaphoresis, or shortness of breath.  He took aspirin without relief.  With persistent pain this morning he presented to the emergency room, at which point his pain abated.  Overall his pain lasted approximately 12 hours.  He states this was more severe than indigestion pain. In addition, the patient does describe a history of shooting pain down his left arm several weeks ago associated with marked heaviness in this arm.  He has no known history of cardiac disease.  PAST MEDICAL HISTORY:  1. Incomplete right bundle branch block on ECG.  2. Hypertension.  3. BPH.  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  5. Left inguinal hernia.  ALLERGIES: CORICIDIN.  MEDICATIONS:  1. Multivitamin q.d.  2. Baby aspirin q.d.  3. Cardura 8 mg q.h.s.  4. Atenolol 50 mg 1/2 tablet q.d.  SOCIAL HISTORY: The patient is retired.  He is married and has two children. He denies tobacco or alcohol use.  FAMILY HISTORY: Father had first myocardial infarction at age 47 and died at age 70 with myocardial infarction.  Mother died with a stroke at age 16.  He has two children alive and well.  REVIEW OF SYSTEMS: Otherwise negative.  PHYSICAL EXAMINATION:  GENERAL: The patient is a well-developed white male in no apparent distress.  VITAL SIGNS: Blood pressure 124/76, pulse 66 and regular,  respirations 20, temperature afebrile.  HEENT: The patient had recent cataract surgery on the right and has a cloudy left cornea, with no vision in this eye.  Oropharynx clear.  NECK: Without JVD, adenopathy, or bruits.  LUNGS: Clear.  CARDIAC: Regular rate and rhythm without murmurs, rubs, or gallops.  ABDOMEN: Soft, nontender, without hepatosplenomegaly, masses, or bruits. There is no epigastric tenderness to palpation.  EXTREMITIES: Pulses are 2+ and symmetric without edema or cyanosis.  NEUROLOGIC: Nonfocal.  RECTAL: Deferred, done by Dr. Renne Crigler in February and was normal.  LABORATORY DATA: ECG demonstrates normal sinus rhythm and incomplete right bundle branch block.  WBC is 5300, hemoglobin 15.4, hematocrit 43.9, platelets 179,000.  Sodium is 139, potassium 4.4, chloride 105, CO2 26, BUN 18, creatinine 0.9, glucose 134. Troponin is less than 0.03.  IMPRESSION:  1. Prolonged epigastric pain, need to rule out coronary ischemia.  Also     possible acid peptic disease and/or gallstones.  2. Hypertension.  3. Incomplete right bundle branch block.  4. Family history of early coronary disease.  PLAN: The patient will be admitted to the rule out MI unit and we will obtain serial cardiac enzymes.  We will continue his at-home medications and add Protonix.  If cardiac enzymes are negative will plan a  stress Cardiolite study in the morning. DD:  12/28/99 TD:  12/28/99 Job: [id number]

## 2011-04-29 LAB — BASIC METABOLIC PANEL WITH GFR
BUN: 14
CO2: 33 — ABNORMAL HIGH
Calcium: 9.7
Chloride: 102
Creatinine, Ser: 0.79
GFR calc non Af Amer: >60
Glucose, Bld: 104 — ABNORMAL HIGH
Potassium: 4.1
Sodium: 142

## 2011-04-29 LAB — CBC
HCT: 27.3 — ABNORMAL LOW
Hemoglobin: 14.4
MCV: 89.3
Platelets: 197
Platelets: 210
RBC: 3.06 — ABNORMAL LOW
RDW: 13.7
WBC: 5.6
WBC: 8.6
WBC: 9.9

## 2011-04-29 LAB — APTT: aPTT: 29

## 2011-04-29 LAB — DIFFERENTIAL
Basophils Absolute: 0
Lymphocytes Relative: 30
Lymphs Abs: 1.7
Neutro Abs: 3.1

## 2011-04-29 LAB — URINALYSIS, ROUTINE W REFLEX MICROSCOPIC
Bilirubin Urine: NEGATIVE
Glucose, UA: NEGATIVE
Hgb urine dipstick: NEGATIVE
Ketones, ur: NEGATIVE
Nitrite: NEGATIVE
Protein, ur: NEGATIVE
Specific Gravity, Urine: 1.024
Urobilinogen, UA: 1
pH: 6.5

## 2011-04-29 LAB — BASIC METABOLIC PANEL
BUN: 17
Calcium: 8.2 — ABNORMAL LOW
Chloride: 107
Creatinine, Ser: 1.23
GFR calc Af Amer: >60
GFR calc non Af Amer: 57 — ABNORMAL LOW
Potassium: 3.9
Potassium: 4.7
Sodium: 139

## 2011-04-29 LAB — TYPE AND SCREEN: ABO/RH(D): A POS

## 2011-04-29 LAB — ABO/RH: ABO/RH(D): A POS

## 2011-04-29 LAB — PROTIME-INR
INR: 1
Prothrombin Time: 13.3

## 2011-09-09 ENCOUNTER — Emergency Department (HOSPITAL_COMMUNITY)
Admission: EM | Admit: 2011-09-09 | Discharge: 2011-09-09 | Disposition: A | Discharge status: Home / Self Care | Payer: Medicare Other | Attending: Emergency Medicine | Admitting: Emergency Medicine

## 2011-09-09 ENCOUNTER — Encounter (HOSPITAL_COMMUNITY): Payer: Self-pay | Admitting: General Practice

## 2011-09-09 ENCOUNTER — Emergency Department (HOSPITAL_COMMUNITY): Payer: Medicare Other

## 2011-09-09 ENCOUNTER — Other Ambulatory Visit: Payer: Self-pay

## 2011-09-09 DIAGNOSIS — I4510 Unspecified right bundle-branch block: Secondary | ICD-10-CM | POA: Diagnosis not present

## 2011-09-09 DIAGNOSIS — R6883 Chills (without fever): Secondary | ICD-10-CM | POA: Diagnosis not present

## 2011-09-09 DIAGNOSIS — F29 Unspecified psychosis not due to a substance or known physiological condition: Secondary | ICD-10-CM | POA: Diagnosis not present

## 2011-09-09 DIAGNOSIS — R5383 Other fatigue: Secondary | ICD-10-CM | POA: Insufficient documentation

## 2011-09-09 DIAGNOSIS — J3489 Other specified disorders of nose and nasal sinuses: Secondary | ICD-10-CM | POA: Diagnosis not present

## 2011-09-09 DIAGNOSIS — M25469 Effusion, unspecified knee: Secondary | ICD-10-CM | POA: Diagnosis not present

## 2011-09-09 DIAGNOSIS — R221 Localized swelling, mass and lump, neck: Secondary | ICD-10-CM | POA: Insufficient documentation

## 2011-09-09 DIAGNOSIS — W11XXXA Fall on and from ladder, initial encounter: Secondary | ICD-10-CM | POA: Insufficient documentation

## 2011-09-09 DIAGNOSIS — Z96649 Presence of unspecified artificial hip joint: Secondary | ICD-10-CM | POA: Insufficient documentation

## 2011-09-09 DIAGNOSIS — R220 Localized swelling, mass and lump, head: Secondary | ICD-10-CM | POA: Insufficient documentation

## 2011-09-09 DIAGNOSIS — R5381 Other malaise: Secondary | ICD-10-CM | POA: Insufficient documentation

## 2011-09-09 DIAGNOSIS — Z96659 Presence of unspecified artificial knee joint: Secondary | ICD-10-CM | POA: Insufficient documentation

## 2011-09-09 DIAGNOSIS — M25569 Pain in unspecified knee: Secondary | ICD-10-CM | POA: Insufficient documentation

## 2011-09-09 DIAGNOSIS — Z9181 History of falling: Secondary | ICD-10-CM | POA: Diagnosis not present

## 2011-09-09 DIAGNOSIS — M25869 Other specified joint disorders, unspecified knee: Secondary | ICD-10-CM | POA: Diagnosis not present

## 2011-09-09 DIAGNOSIS — S82109A Unspecified fracture of upper end of unspecified tibia, initial encounter for closed fracture: Secondary | ICD-10-CM | POA: Insufficient documentation

## 2011-09-09 DIAGNOSIS — Z79899 Other long term (current) drug therapy: Secondary | ICD-10-CM | POA: Diagnosis not present

## 2011-09-09 DIAGNOSIS — S82143A Displaced bicondylar fracture of unspecified tibia, initial encounter for closed fracture: Secondary | ICD-10-CM

## 2011-09-09 HISTORY — DX: Blindness, one eye, unspecified eye: H54.40

## 2011-09-09 LAB — DIFFERENTIAL
Basophils Absolute: 0 10*3/uL (ref 0.0–0.1)
Eosinophils Relative: 0 % (ref 0–5)
Lymphocytes Relative: 9 % — ABNORMAL LOW (ref 12–46)
Monocytes Absolute: 1.9 10*3/uL — ABNORMAL HIGH (ref 0.1–1.0)
Monocytes Relative: 14 % — ABNORMAL HIGH (ref 3–12)

## 2011-09-09 LAB — CBC
HCT: 41.1 % (ref 39.0–52.0)
Hemoglobin: 13.8 g/dL (ref 13.0–17.0)
MCHC: 33.6 g/dL (ref 30.0–36.0)
MCV: 89.2 fL (ref 78.0–100.0)
RDW: 13.9 % (ref 11.5–15.5)

## 2011-09-09 LAB — BASIC METABOLIC PANEL
BUN: 18 mg/dL (ref 6–23)
CO2: 25 mEq/L (ref 19–32)
Calcium: 9.4 mg/dL (ref 8.4–10.5)
Creatinine, Ser: 0.78 mg/dL (ref 0.50–1.35)

## 2011-09-09 MED ORDER — MORPHINE SULFATE 4 MG/ML IJ SOLN
4.0000 mg | Freq: Once | INTRAMUSCULAR | Status: AC
  Administered 2011-09-09: 4 mg via INTRAVENOUS
  Filled 2011-09-09: qty 1

## 2011-09-09 MED ORDER — TETANUS-DIPHTH-ACELL PERTUSSIS 5-2.5-18.5 LF-MCG/0.5 IM SUSP
0.5000 mL | Freq: Once | INTRAMUSCULAR | Status: AC
  Administered 2011-09-09: 0.5 mL via INTRAMUSCULAR
  Filled 2011-09-09: qty 0.5

## 2011-09-09 MED ORDER — POLYETHYLENE GLYCOL 3350 17 GM/SCOOP PO POWD: 17.0000 g | Freq: Every day | ORAL | Status: AC

## 2011-09-09 MED ORDER — OXYCODONE-ACETAMINOPHEN 5-325 MG PO TABS: 1.0000 | ORAL_TABLET | Freq: Four times a day (QID) | ORAL | Status: AC | PRN

## 2011-09-09 MED ORDER — ALBUTEROL SULFATE (5 MG/ML) 0.5% IN NEBU
INHALATION_SOLUTION | RESPIRATORY_TRACT | Status: AC
  Filled 2011-09-09: qty 3

## 2011-09-09 NOTE — ED Notes (Signed)
Went in and assessed patient.  He remains alert and oriented and has been back from X Ray and CT for a short period of time He denys pain and is comfortable laying in the bed with head slightly elevated, he was offered a blanket but states he is not in pain

## 2011-09-09 NOTE — Consult Note (Signed)
CC: left leg pain HPI: 78 y/o male fell from a 2 step ladder 2 days ago hitting head and injuring left knee also. Pt denies loc, no pain to back or left leg currently. Pt c/o difficulty with ambulation. Denies any other symptoms. Has hx of bilateral hip and right knee replacements. PMH: blindness, osteoarthritis Social: lives alone, no smoking, no etoh Allergies: nkda PE: alert and appropriate 78 y/o male in no acute distress Bilateral upper extremities show full rom, no deformity, nv intact distally Pelvis stable Left leg: mild effusion, nv intact distally, pt not taken through any rom  Right lower extremity: full rom, nv intact distally Pt has been weight bearing X-rays: CT shows minimally displaced left tibial plateau fx Assessment: Left minimally displaced tibial plateau fx Plan: aspiration and discharge home with strict non weight bearing. Pt in agreement Procedure: after sterile prep and consent pt had 30cc of bloody aspirate drained and jones dressing applied. Knee immobilizer applied after jones dressing Plan to see pt back in 1 week in clinic for evaluation.

## 2011-09-09 NOTE — ED Notes (Signed)
Pt. was aware of needing to give urine specimen. Pt. Stated" I can't urinate at this time."

## 2011-09-09 NOTE — ED Notes (Signed)
Patient states that while working in his house on Tuesday he fell at home from a ladder and lacerated the posterior aspect of his head. Patient states that he began having pain today in that area and has had three episodes of urinary incontinence since the fall along with decreased mobility and ability to coordinate movement while walking. Patient also states that he has had chills over the past two days.

## 2011-09-09 NOTE — ED Provider Notes (Signed)
History     CSN: [id number]  Arrival date & time 09/09/11  4098   First MD Initiated Contact with Patient 09/09/11 [phone]      Chief Complaint  Patient presents with  . Fall  . Chills    (Consider location/radiation/quality/duration/timing/severity/associated sxs/prior treatment) HPI Comments: Patient states he fell from the second step of a ladder 2 days ago. Landed on his left knee and head. Has had increased difficulty with ambulation secondary to pain in his left knee. There is associated swelling. Has been unable to make it to the bathroom on 2 separate occasions secondary to pain. Also states she's had chills when he attempts to stand secondary to pain.  Patient is a 78 y.o. male presenting with fall. The history is provided by the patient. No language interpreter was used.  Fall The accident occurred 2 days ago. The fall occurred from a ladder. Distance fallen: 2-3 feet. He landed on concrete. The volume of blood lost was minimal. The point of impact was the head and left knee. The pain is present in the left knee. The pain is moderate. He was ambulatory at the scene. There was no entrapment after the fall. There was no drug use involved in the accident. There was no alcohol use involved in the accident. Pertinent negatives include no visual change, no fever, no numbness, no abdominal pain, no bowel incontinence, no nausea, no vomiting, no headaches, no loss of consciousness and no tingling. The symptoms are aggravated by activity, standing and use of the injured limb. He has tried nothing for the symptoms.    Past Medical History  Diagnosis Date  . Blindness of right eye     Past Surgical History  Procedure Date  . Bilateral hip replacement   . Right knee replacement   . Hernia repair   . Joint replacement     No family history on file.  History  Substance Use Topics  . Smoking status: Former Games developer  . Smokeless tobacco: Not on file  . Alcohol Use: No      Review  of Systems  Constitutional: Positive for chills (when walking due to pain). Negative for fever, activity change, appetite change and fatigue.  HENT: Negative for congestion, sore throat, rhinorrhea, neck pain and neck stiffness.   Respiratory: Negative for cough and shortness of breath.   Cardiovascular: Negative for chest pain and palpitations.  Gastrointestinal: Negative for nausea, vomiting, abdominal pain and bowel incontinence.  Genitourinary: Negative for dysuria, urgency, frequency and flank pain.  Musculoskeletal: Positive for arthralgias and gait problem. Negative for myalgias and back pain.  Skin: Positive for wound. Negative for color change.  Neurological: Negative for dizziness, tingling, loss of consciousness, weakness, light-headedness, numbness and headaches.  All other systems reviewed and are negative.    Allergies  Milk-related compounds  Home Medications   Current Outpatient Rx  Name Route Sig Dispense Refill  . ASPIRIN 81 MG PO TBEC Oral Take 81 mg by mouth every other day. Swallow whole.    . ATORVASTATIN CALCIUM 20 MG PO TABS Oral Take 20 mg by mouth daily.    Marland Kitchen LEVOTHYROXINE SODIUM 100 MCG PO TABS Oral Take 100 mcg by mouth daily.    Marland Kitchen LISINOPRIL 10 MG PO TABS Oral Take 10 mg by mouth daily.    Marland Kitchen METFORMIN HCL 500 MG PO TABS Oral Take 500 mg by mouth daily.    . MULTI-VITAMIN/MINERALS PO TABS Oral Take 1 tablet by mouth daily.    Marland Kitchen  ZOLPIDEM TARTRATE 5 MG PO TABS Oral Take 5 mg by mouth at bedtime as needed. sleep    . OXYCODONE-ACETAMINOPHEN 5-325 MG PO TABS Oral Take 1-2 tablets by mouth every 6 (six) hours as needed for pain. 20 tablet 0  . POLYETHYLENE GLYCOL 3350 PO POWD Oral Take 17 g by mouth daily. 255 g 0    BP 127/68  Pulse 12  Temp(Src) 98.3 F (36.8 C) (Oral)  Resp 16  SpO2 97%  Physical Exam  Nursing note and vitals reviewed. Constitutional: He is oriented to person, place, and time. He appears well-developed and well-nourished. No  distress.  HENT:  Head: Normocephalic.  Mouth/Throat: Oropharynx is clear and moist.       Clotted blood to the posterior aspect of his occiput.  No visible laceration noted on initial examination  Eyes: Conjunctivae and EOM are normal. Pupils are equal, round, and reactive to light.  Neck: Normal range of motion. Neck supple.       No midline cervical spine tenderness  Cardiovascular: Normal rate, regular rhythm, normal heart sounds and intact distal pulses.  Exam reveals no gallop and no friction rub.   No murmur heard. Pulmonary/Chest: Effort normal and breath sounds normal. No respiratory distress. He exhibits no tenderness.  Abdominal: Soft. Bowel sounds are normal. There is no tenderness.  Musculoskeletal:       Left knee: He exhibits decreased range of motion, swelling and effusion. He exhibits normal patellar mobility. tenderness found.       Legs: Neurological: He is alert and oriented to person, place, and time. No cranial nerve deficit.  Skin: Skin is warm and dry. No rash noted.    ED Course  Procedures (including critical care time)   Date: 09/09/2011  Rate: 83  Rhythm: normal sinus rhythm  QRS Axis: normal  Intervals: normal  ST/T Wave abnormalities: normal  Conduction Disutrbances:right bundle branch block  Narrative Interpretation:   Old EKG Reviewed: unchanged  Labs Reviewed  CBC - Abnormal; Notable for the following:    WBC 13.0 (*)    All other components within normal limits  DIFFERENTIAL - Abnormal; Notable for the following:    Neutro Abs 10.0 (*)    Lymphocytes Relative 9 (*)    Monocytes Relative 14 (*)    Monocytes Absolute 1.9 (*)    All other components within normal limits  BASIC METABOLIC PANEL - Abnormal; Notable for the following:    Glucose, Bld 145 (*)    GFR calc non Af Amer 81 (*)    All other components within normal limits  URINALYSIS, ROUTINE W REFLEX MICROSCOPIC   Dg Femur Left  09/09/2011  *RADIOLOGY REPORT*  Clinical Data: Fall   LEFT FEMUR - 2 VIEW  Comparison: None.  Findings: There is advanced osteoarthritis involving the left knee.  Prior left hip arthroplasty.  No evidence for periprosthetic fracture or subluxation.  No fracture or subluxation.  IMPRESSION:  1.  No acute findings. 2.  Left knee osteoarthritis.  Original Report Authenticated By: Rosealee Albee, M.D.   Ct Head Wo Contrast  09/09/2011  *RADIOLOGY REPORT*  Clinical Data: Fall 3 days ago.  Laceration posterior head. Confusion.  Urinary incontinence.  Generalized weakness.  CT HEAD WITHOUT CONTRAST  Technique:  Contiguous axial images were obtained from the base of the skull through the vertex without contrast.  Comparison: None.  Findings: Bone windows demonstrate surgical changes about the right globe.  Mild soft tissue swelling about the occipital scalp. Mucosal  thickening of ethmoid air cells.  No underlying skull fracture. Clear mastoid air cells.  Soft tissue windows demonstrate expected cerebral atrophy for age. Minimal low density in the periventricular white matter likely related to small vessel disease.  No  mass lesion, hemorrhage, hydrocephalus, acute infarct, intra- axial, or extra-axial fluid collection.  IMPRESSION:  1.  Left occipital scalp soft tissue swelling. 2. No acute intracranial abnormality. 3.  Sinus disease.  Original Report Authenticated By: Consuello Bossier, M.D.   Ct Knee Left Wo Contrast  09/09/2011  *RADIOLOGY REPORT*  Clinical Data: Status post fall.  Knee pain.  CT OF THE LEFT KNEE WITHOUT CONTRAST  Technique:  Multidetector CT imaging was performed according to the standard protocol. Multiplanar CT image reconstructions were also generated.  Comparison: Plain films 09/09/2011 and 02/09 6:00 a.m.  Findings: As seen on the patient's plain films, there is a nondisplaced fracture of the lateral tibial plateau.  No step off at the articular surface is identified.  There is no other fracture.  The patient has tricompartmental osteoarthritis  which appears worst about the medial compartment where there is marked joint space narrowing.  Joint effusion is noted.  Soft tissue structures are otherwise unremarkable.  IMPRESSION:  1.  Nondisplaced lateral tibial plateau fracture. 2.  Tricompartmental degenerative change appears worst in the medial compartment.  Original Report Authenticated By: Bernadene Bell. D'ALESSIO, M.D.   Dg Knee Complete 4 Views Left  09/09/2011  *RADIOLOGY REPORT*  Clinical Data: Fall.  Chills  LEFT KNEE - COMPLETE 4+ VIEW  Comparison: None  Findings: There is a lateral tibial plateau fracture which is difficult to appreciate on the femur films.  There may be a few millimeters of depression of the lateral fracture fragment.  A moderate sized joint effusion is identified.  There is no radio-opaque foreign body or soft tissue calcification.  Advanced osteoarthritis with medial compartment narrowing, chondrocalcinosis, marginal spur formation and sharpening of the tibial spines is noted.  IMPRESSION:  1.  Examination is positive for a lateral tibial plateau fracture with a few millimeters of depression.  Associated joint effusion is present.  Recommend further evaluation of fracture with CT of the left knee.  These results will be called to the ordering clinician or representative by the Radiologist Assistant, and communication documented in the PACS Dashboard.  Original Report Authenticated By: Rosealee Albee, M.D.     1. Tibial plateau fracture       MDM  Orthopedics was consulted who performed an arthrocentesis draining approximately 30 cc of bloody fluid. He be placed in a knee immobilizer and given crutches. Instructed to followup with her practice in one week. Be discharged home with Percocet and MiraLAX.        Dayton Bailiff, MD 09/09/11 [phone]

## 2011-09-09 NOTE — ED Notes (Signed)
Advised patient on several occassions needed a urine sample. He stated, "I will pee when I need to." RN Marla notified.

## 2011-09-15 DIAGNOSIS — S82109A Unspecified fracture of upper end of unspecified tibia, initial encounter for closed fracture: Secondary | ICD-10-CM | POA: Diagnosis not present

## 2011-09-30 DIAGNOSIS — H04129 Dry eye syndrome of unspecified lacrimal gland: Secondary | ICD-10-CM | POA: Diagnosis not present

## 2011-10-13 DIAGNOSIS — S82109A Unspecified fracture of upper end of unspecified tibia, initial encounter for closed fracture: Secondary | ICD-10-CM | POA: Diagnosis not present

## 2011-11-22 DIAGNOSIS — Z79899 Other long term (current) drug therapy: Secondary | ICD-10-CM | POA: Diagnosis not present

## 2011-11-22 DIAGNOSIS — E119 Type 2 diabetes mellitus without complications: Secondary | ICD-10-CM | POA: Diagnosis not present

## 2011-11-22 DIAGNOSIS — E7800 Pure hypercholesterolemia, unspecified: Secondary | ICD-10-CM | POA: Diagnosis not present

## 2011-11-25 DIAGNOSIS — E7800 Pure hypercholesterolemia, unspecified: Secondary | ICD-10-CM | POA: Diagnosis not present

## 2011-11-25 DIAGNOSIS — Z8781 Personal history of (healed) traumatic fracture: Secondary | ICD-10-CM | POA: Diagnosis not present

## 2011-12-10 DIAGNOSIS — E782 Mixed hyperlipidemia: Secondary | ICD-10-CM | POA: Diagnosis not present

## 2012-04-05 DIAGNOSIS — H04129 Dry eye syndrome of unspecified lacrimal gland: Secondary | ICD-10-CM | POA: Diagnosis not present

## 2012-07-10 DIAGNOSIS — L820 Inflamed seborrheic keratosis: Secondary | ICD-10-CM | POA: Diagnosis not present

## 2012-07-10 DIAGNOSIS — L723 Sebaceous cyst: Secondary | ICD-10-CM | POA: Diagnosis not present

## 2012-07-10 DIAGNOSIS — L819 Disorder of pigmentation, unspecified: Secondary | ICD-10-CM | POA: Diagnosis not present

## 2012-07-10 DIAGNOSIS — L570 Actinic keratosis: Secondary | ICD-10-CM | POA: Diagnosis not present

## 2012-07-11 DIAGNOSIS — I10 Essential (primary) hypertension: Secondary | ICD-10-CM | POA: Diagnosis not present

## 2012-07-11 DIAGNOSIS — Z79899 Other long term (current) drug therapy: Secondary | ICD-10-CM | POA: Diagnosis not present

## 2012-07-11 DIAGNOSIS — E039 Hypothyroidism, unspecified: Secondary | ICD-10-CM | POA: Diagnosis not present

## 2012-07-11 DIAGNOSIS — E7800 Pure hypercholesterolemia, unspecified: Secondary | ICD-10-CM | POA: Diagnosis not present

## 2012-07-11 DIAGNOSIS — E119 Type 2 diabetes mellitus without complications: Secondary | ICD-10-CM | POA: Diagnosis not present

## 2012-07-11 DIAGNOSIS — Z0000 Encounter for general adult medical examination without abnormal findings: Secondary | ICD-10-CM | POA: Diagnosis not present

## 2012-07-18 DIAGNOSIS — E7800 Pure hypercholesterolemia, unspecified: Secondary | ICD-10-CM | POA: Diagnosis not present

## 2012-07-18 DIAGNOSIS — D126 Benign neoplasm of colon, unspecified: Secondary | ICD-10-CM | POA: Diagnosis not present

## 2012-07-18 DIAGNOSIS — I10 Essential (primary) hypertension: Secondary | ICD-10-CM | POA: Diagnosis not present

## 2012-07-18 DIAGNOSIS — E119 Type 2 diabetes mellitus without complications: Secondary | ICD-10-CM | POA: Diagnosis not present

## 2012-10-12 DIAGNOSIS — H04129 Dry eye syndrome of unspecified lacrimal gland: Secondary | ICD-10-CM | POA: Diagnosis not present

## 2012-11-13 DIAGNOSIS — E7800 Pure hypercholesterolemia, unspecified: Secondary | ICD-10-CM | POA: Diagnosis not present

## 2012-11-13 DIAGNOSIS — E119 Type 2 diabetes mellitus without complications: Secondary | ICD-10-CM | POA: Diagnosis not present

## 2012-11-13 DIAGNOSIS — Z79899 Other long term (current) drug therapy: Secondary | ICD-10-CM | POA: Diagnosis not present

## 2012-11-16 DIAGNOSIS — E119 Type 2 diabetes mellitus without complications: Secondary | ICD-10-CM | POA: Diagnosis not present

## 2012-11-16 DIAGNOSIS — E7800 Pure hypercholesterolemia, unspecified: Secondary | ICD-10-CM | POA: Diagnosis not present

## 2012-11-16 DIAGNOSIS — I10 Essential (primary) hypertension: Secondary | ICD-10-CM | POA: Diagnosis not present

## 2013-04-12 DIAGNOSIS — E119 Type 2 diabetes mellitus without complications: Secondary | ICD-10-CM | POA: Diagnosis not present

## 2013-05-15 DIAGNOSIS — E119 Type 2 diabetes mellitus without complications: Secondary | ICD-10-CM | POA: Diagnosis not present

## 2013-05-15 DIAGNOSIS — E7800 Pure hypercholesterolemia, unspecified: Secondary | ICD-10-CM | POA: Diagnosis not present

## 2013-05-17 DIAGNOSIS — I10 Essential (primary) hypertension: Secondary | ICD-10-CM | POA: Diagnosis not present

## 2013-05-17 DIAGNOSIS — Z006 Encounter for examination for normal comparison and control in clinical research program: Secondary | ICD-10-CM | POA: Diagnosis not present

## 2013-05-17 DIAGNOSIS — E7800 Pure hypercholesterolemia, unspecified: Secondary | ICD-10-CM | POA: Diagnosis not present

## 2013-05-17 DIAGNOSIS — E119 Type 2 diabetes mellitus without complications: Secondary | ICD-10-CM | POA: Diagnosis not present

## 2013-06-21 DIAGNOSIS — S63509A Unspecified sprain of unspecified wrist, initial encounter: Secondary | ICD-10-CM | POA: Diagnosis not present

## 2013-07-16 DIAGNOSIS — L821 Other seborrheic keratosis: Secondary | ICD-10-CM | POA: Diagnosis not present

## 2013-07-16 DIAGNOSIS — D239 Other benign neoplasm of skin, unspecified: Secondary | ICD-10-CM | POA: Diagnosis not present

## 2013-07-16 DIAGNOSIS — D1801 Hemangioma of skin and subcutaneous tissue: Secondary | ICD-10-CM | POA: Diagnosis not present

## 2013-07-16 DIAGNOSIS — L570 Actinic keratosis: Secondary | ICD-10-CM | POA: Diagnosis not present

## 2013-07-16 DIAGNOSIS — L819 Disorder of pigmentation, unspecified: Secondary | ICD-10-CM | POA: Diagnosis not present

## 2013-07-17 DIAGNOSIS — Z79899 Other long term (current) drug therapy: Secondary | ICD-10-CM | POA: Diagnosis not present

## 2013-07-17 DIAGNOSIS — Z125 Encounter for screening for malignant neoplasm of prostate: Secondary | ICD-10-CM | POA: Diagnosis not present

## 2013-07-17 DIAGNOSIS — E7800 Pure hypercholesterolemia, unspecified: Secondary | ICD-10-CM | POA: Diagnosis not present

## 2013-07-17 DIAGNOSIS — I10 Essential (primary) hypertension: Secondary | ICD-10-CM | POA: Diagnosis not present

## 2013-07-19 DIAGNOSIS — S63509A Unspecified sprain of unspecified wrist, initial encounter: Secondary | ICD-10-CM | POA: Diagnosis not present

## 2013-07-23 DIAGNOSIS — Z1212 Encounter for screening for malignant neoplasm of rectum: Secondary | ICD-10-CM | POA: Diagnosis not present

## 2013-07-23 DIAGNOSIS — E119 Type 2 diabetes mellitus without complications: Secondary | ICD-10-CM | POA: Diagnosis not present

## 2013-07-23 DIAGNOSIS — Z0000 Encounter for general adult medical examination without abnormal findings: Secondary | ICD-10-CM | POA: Diagnosis not present

## 2013-07-23 DIAGNOSIS — I10 Essential (primary) hypertension: Secondary | ICD-10-CM | POA: Diagnosis not present

## 2013-07-23 DIAGNOSIS — Z1331 Encounter for screening for depression: Secondary | ICD-10-CM | POA: Diagnosis not present

## 2013-07-23 DIAGNOSIS — E7800 Pure hypercholesterolemia, unspecified: Secondary | ICD-10-CM | POA: Diagnosis not present

## 2013-08-16 DIAGNOSIS — M654 Radial styloid tenosynovitis [de Quervain]: Secondary | ICD-10-CM | POA: Diagnosis not present

## 2013-08-16 DIAGNOSIS — S63509A Unspecified sprain of unspecified wrist, initial encounter: Secondary | ICD-10-CM | POA: Diagnosis not present

## 2013-10-11 DIAGNOSIS — E119 Type 2 diabetes mellitus without complications: Secondary | ICD-10-CM | POA: Diagnosis not present

## 2013-11-13 DIAGNOSIS — E7800 Pure hypercholesterolemia, unspecified: Secondary | ICD-10-CM | POA: Diagnosis not present

## 2013-11-13 DIAGNOSIS — E119 Type 2 diabetes mellitus without complications: Secondary | ICD-10-CM | POA: Diagnosis not present

## 2013-11-15 DIAGNOSIS — E119 Type 2 diabetes mellitus without complications: Secondary | ICD-10-CM | POA: Diagnosis not present

## 2013-11-15 DIAGNOSIS — E7800 Pure hypercholesterolemia, unspecified: Secondary | ICD-10-CM | POA: Diagnosis not present

## 2013-11-15 DIAGNOSIS — I10 Essential (primary) hypertension: Secondary | ICD-10-CM | POA: Diagnosis not present

## 2014-05-16 DIAGNOSIS — E780 Pure hypercholesterolemia: Secondary | ICD-10-CM | POA: Diagnosis not present

## 2014-05-16 DIAGNOSIS — E119 Type 2 diabetes mellitus without complications: Secondary | ICD-10-CM | POA: Diagnosis not present

## 2014-05-16 DIAGNOSIS — I10 Essential (primary) hypertension: Secondary | ICD-10-CM | POA: Diagnosis not present

## 2014-05-16 DIAGNOSIS — E785 Hyperlipidemia, unspecified: Secondary | ICD-10-CM | POA: Diagnosis not present

## 2014-07-30 DIAGNOSIS — D225 Melanocytic nevi of trunk: Secondary | ICD-10-CM | POA: Diagnosis not present

## 2014-07-30 DIAGNOSIS — L905 Scar conditions and fibrosis of skin: Secondary | ICD-10-CM | POA: Diagnosis not present

## 2014-07-30 DIAGNOSIS — L720 Epidermal cyst: Secondary | ICD-10-CM | POA: Diagnosis not present

## 2014-07-30 DIAGNOSIS — L821 Other seborrheic keratosis: Secondary | ICD-10-CM | POA: Diagnosis not present

## 2014-07-31 DIAGNOSIS — E039 Hypothyroidism, unspecified: Secondary | ICD-10-CM | POA: Diagnosis not present

## 2014-07-31 DIAGNOSIS — Z125 Encounter for screening for malignant neoplasm of prostate: Secondary | ICD-10-CM | POA: Diagnosis not present

## 2014-07-31 DIAGNOSIS — I10 Essential (primary) hypertension: Secondary | ICD-10-CM | POA: Diagnosis not present

## 2014-07-31 DIAGNOSIS — Z0000 Encounter for general adult medical examination without abnormal findings: Secondary | ICD-10-CM | POA: Diagnosis not present

## 2014-07-31 DIAGNOSIS — E118 Type 2 diabetes mellitus with unspecified complications: Secondary | ICD-10-CM | POA: Diagnosis not present

## 2014-07-31 DIAGNOSIS — E780 Pure hypercholesterolemia: Secondary | ICD-10-CM | POA: Diagnosis not present

## 2014-07-31 DIAGNOSIS — R350 Frequency of micturition: Secondary | ICD-10-CM | POA: Diagnosis not present

## 2014-08-06 DIAGNOSIS — Z0000 Encounter for general adult medical examination without abnormal findings: Secondary | ICD-10-CM | POA: Diagnosis not present

## 2014-08-06 DIAGNOSIS — N529 Male erectile dysfunction, unspecified: Secondary | ICD-10-CM | POA: Diagnosis not present

## 2014-08-06 DIAGNOSIS — R972 Elevated prostate specific antigen [PSA]: Secondary | ICD-10-CM | POA: Diagnosis not present

## 2014-08-06 DIAGNOSIS — I10 Essential (primary) hypertension: Secondary | ICD-10-CM | POA: Diagnosis not present

## 2014-08-06 DIAGNOSIS — G4700 Insomnia, unspecified: Secondary | ICD-10-CM | POA: Diagnosis not present

## 2014-11-06 DIAGNOSIS — N3943 Post-void dribbling: Secondary | ICD-10-CM | POA: Diagnosis not present

## 2014-11-06 DIAGNOSIS — R350 Frequency of micturition: Secondary | ICD-10-CM | POA: Diagnosis not present

## 2014-11-06 DIAGNOSIS — N5201 Erectile dysfunction due to arterial insufficiency: Secondary | ICD-10-CM | POA: Diagnosis not present

## 2014-11-06 DIAGNOSIS — N403 Nodular prostate with lower urinary tract symptoms: Secondary | ICD-10-CM | POA: Diagnosis not present

## 2014-11-12 DIAGNOSIS — E119 Type 2 diabetes mellitus without complications: Secondary | ICD-10-CM | POA: Diagnosis not present

## 2014-11-12 DIAGNOSIS — I10 Essential (primary) hypertension: Secondary | ICD-10-CM | POA: Diagnosis not present

## 2014-11-14 DIAGNOSIS — E119 Type 2 diabetes mellitus without complications: Secondary | ICD-10-CM | POA: Diagnosis not present

## 2014-11-14 DIAGNOSIS — E780 Pure hypercholesterolemia: Secondary | ICD-10-CM | POA: Diagnosis not present

## 2014-11-14 DIAGNOSIS — I10 Essential (primary) hypertension: Secondary | ICD-10-CM | POA: Diagnosis not present

## 2015-01-20 DIAGNOSIS — R350 Frequency of micturition: Secondary | ICD-10-CM | POA: Diagnosis not present

## 2015-01-20 DIAGNOSIS — N403 Nodular prostate with lower urinary tract symptoms: Secondary | ICD-10-CM | POA: Diagnosis not present

## 2015-01-20 DIAGNOSIS — N3943 Post-void dribbling: Secondary | ICD-10-CM | POA: Diagnosis not present

## 2015-01-24 DIAGNOSIS — E119 Type 2 diabetes mellitus without complications: Secondary | ICD-10-CM | POA: Diagnosis not present

## 2015-01-24 DIAGNOSIS — I10 Essential (primary) hypertension: Secondary | ICD-10-CM | POA: Diagnosis not present

## 2015-01-24 DIAGNOSIS — E780 Pure hypercholesterolemia: Secondary | ICD-10-CM | POA: Diagnosis not present

## 2015-01-24 DIAGNOSIS — E039 Hypothyroidism, unspecified: Secondary | ICD-10-CM | POA: Diagnosis not present

## 2015-01-31 DIAGNOSIS — E780 Pure hypercholesterolemia: Secondary | ICD-10-CM | POA: Diagnosis not present

## 2015-01-31 DIAGNOSIS — E039 Hypothyroidism, unspecified: Secondary | ICD-10-CM | POA: Diagnosis not present

## 2015-01-31 DIAGNOSIS — I10 Essential (primary) hypertension: Secondary | ICD-10-CM | POA: Diagnosis not present

## 2015-01-31 DIAGNOSIS — E119 Type 2 diabetes mellitus without complications: Secondary | ICD-10-CM | POA: Diagnosis not present

## 2015-05-15 DIAGNOSIS — I10 Essential (primary) hypertension: Secondary | ICD-10-CM | POA: Diagnosis not present

## 2015-05-15 DIAGNOSIS — E7800 Pure hypercholesterolemia, unspecified: Secondary | ICD-10-CM | POA: Diagnosis not present

## 2015-05-15 DIAGNOSIS — E119 Type 2 diabetes mellitus without complications: Secondary | ICD-10-CM | POA: Diagnosis not present

## 2015-05-20 DIAGNOSIS — E7800 Pure hypercholesterolemia, unspecified: Secondary | ICD-10-CM | POA: Diagnosis not present

## 2015-05-20 DIAGNOSIS — I10 Essential (primary) hypertension: Secondary | ICD-10-CM | POA: Diagnosis not present

## 2015-05-20 DIAGNOSIS — E119 Type 2 diabetes mellitus without complications: Secondary | ICD-10-CM | POA: Diagnosis not present

## 2015-08-05 DIAGNOSIS — E039 Hypothyroidism, unspecified: Secondary | ICD-10-CM | POA: Diagnosis not present

## 2015-08-05 DIAGNOSIS — E119 Type 2 diabetes mellitus without complications: Secondary | ICD-10-CM | POA: Diagnosis not present

## 2015-08-05 DIAGNOSIS — E7800 Pure hypercholesterolemia, unspecified: Secondary | ICD-10-CM | POA: Diagnosis not present

## 2015-08-05 DIAGNOSIS — I10 Essential (primary) hypertension: Secondary | ICD-10-CM | POA: Diagnosis not present

## 2015-08-05 DIAGNOSIS — N390 Urinary tract infection, site not specified: Secondary | ICD-10-CM | POA: Diagnosis not present

## 2015-08-05 DIAGNOSIS — Z0000 Encounter for general adult medical examination without abnormal findings: Secondary | ICD-10-CM | POA: Diagnosis not present

## 2015-08-12 DIAGNOSIS — E039 Hypothyroidism, unspecified: Secondary | ICD-10-CM | POA: Diagnosis not present

## 2015-08-12 DIAGNOSIS — Z0000 Encounter for general adult medical examination without abnormal findings: Secondary | ICD-10-CM | POA: Diagnosis not present

## 2015-08-12 DIAGNOSIS — R972 Elevated prostate specific antigen [PSA]: Secondary | ICD-10-CM | POA: Diagnosis not present

## 2015-08-12 DIAGNOSIS — N390 Urinary tract infection, site not specified: Secondary | ICD-10-CM | POA: Diagnosis not present

## 2015-09-04 DIAGNOSIS — H44522 Atrophy of globe, left eye: Secondary | ICD-10-CM | POA: Diagnosis not present

## 2015-09-04 DIAGNOSIS — H17821 Peripheral opacity of cornea, right eye: Secondary | ICD-10-CM | POA: Diagnosis not present

## 2015-09-04 DIAGNOSIS — Z961 Presence of intraocular lens: Secondary | ICD-10-CM | POA: Diagnosis not present

## 2015-09-04 DIAGNOSIS — E119 Type 2 diabetes mellitus without complications: Secondary | ICD-10-CM | POA: Diagnosis not present

## 2015-09-16 DIAGNOSIS — D225 Melanocytic nevi of trunk: Secondary | ICD-10-CM | POA: Diagnosis not present

## 2015-09-16 DIAGNOSIS — L821 Other seborrheic keratosis: Secondary | ICD-10-CM | POA: Diagnosis not present

## 2015-09-16 DIAGNOSIS — L723 Sebaceous cyst: Secondary | ICD-10-CM | POA: Diagnosis not present

## 2015-11-18 DIAGNOSIS — E7800 Pure hypercholesterolemia, unspecified: Secondary | ICD-10-CM | POA: Diagnosis not present

## 2015-11-18 DIAGNOSIS — E119 Type 2 diabetes mellitus without complications: Secondary | ICD-10-CM | POA: Diagnosis not present

## 2015-11-18 DIAGNOSIS — I10 Essential (primary) hypertension: Secondary | ICD-10-CM | POA: Diagnosis not present

## 2015-11-20 DIAGNOSIS — E119 Type 2 diabetes mellitus without complications: Secondary | ICD-10-CM | POA: Diagnosis not present

## 2015-11-20 DIAGNOSIS — E785 Hyperlipidemia, unspecified: Secondary | ICD-10-CM | POA: Diagnosis not present

## 2015-11-20 DIAGNOSIS — I10 Essential (primary) hypertension: Secondary | ICD-10-CM | POA: Diagnosis not present

## 2016-02-02 DIAGNOSIS — I10 Essential (primary) hypertension: Secondary | ICD-10-CM | POA: Diagnosis not present

## 2016-02-02 DIAGNOSIS — E119 Type 2 diabetes mellitus without complications: Secondary | ICD-10-CM | POA: Diagnosis not present

## 2016-02-02 DIAGNOSIS — E785 Hyperlipidemia, unspecified: Secondary | ICD-10-CM | POA: Diagnosis not present

## 2016-02-09 DIAGNOSIS — I10 Essential (primary) hypertension: Secondary | ICD-10-CM | POA: Diagnosis not present

## 2016-02-09 DIAGNOSIS — E039 Hypothyroidism, unspecified: Secondary | ICD-10-CM | POA: Diagnosis not present

## 2016-02-09 DIAGNOSIS — E119 Type 2 diabetes mellitus without complications: Secondary | ICD-10-CM | POA: Diagnosis not present

## 2016-02-09 DIAGNOSIS — E7800 Pure hypercholesterolemia, unspecified: Secondary | ICD-10-CM | POA: Diagnosis not present

## 2016-08-11 DIAGNOSIS — E7800 Pure hypercholesterolemia, unspecified: Secondary | ICD-10-CM | POA: Diagnosis not present

## 2016-08-11 DIAGNOSIS — E039 Hypothyroidism, unspecified: Secondary | ICD-10-CM | POA: Diagnosis not present

## 2016-08-11 DIAGNOSIS — E119 Type 2 diabetes mellitus without complications: Secondary | ICD-10-CM | POA: Diagnosis not present

## 2016-08-11 DIAGNOSIS — Z0000 Encounter for general adult medical examination without abnormal findings: Secondary | ICD-10-CM | POA: Diagnosis not present

## 2016-08-11 DIAGNOSIS — Z125 Encounter for screening for malignant neoplasm of prostate: Secondary | ICD-10-CM | POA: Diagnosis not present

## 2016-08-19 DIAGNOSIS — I10 Essential (primary) hypertension: Secondary | ICD-10-CM | POA: Diagnosis not present

## 2016-08-19 DIAGNOSIS — L570 Actinic keratosis: Secondary | ICD-10-CM | POA: Diagnosis not present

## 2016-08-19 DIAGNOSIS — E7800 Pure hypercholesterolemia, unspecified: Secondary | ICD-10-CM | POA: Diagnosis not present

## 2016-08-19 DIAGNOSIS — E039 Hypothyroidism, unspecified: Secondary | ICD-10-CM | POA: Diagnosis not present

## 2016-08-30 DIAGNOSIS — R972 Elevated prostate specific antigen [PSA]: Secondary | ICD-10-CM | POA: Diagnosis not present

## 2016-08-30 DIAGNOSIS — R350 Frequency of micturition: Secondary | ICD-10-CM | POA: Diagnosis not present

## 2016-08-30 DIAGNOSIS — N401 Enlarged prostate with lower urinary tract symptoms: Secondary | ICD-10-CM | POA: Diagnosis not present

## 2016-09-04 ENCOUNTER — Inpatient Hospital Stay (HOSPITAL_COMMUNITY)
Admission: EM | Admit: 2016-09-04 | Discharge: 2016-09-08 | DRG: 535 | Disposition: A | Discharge status: Skilled Nursing Facility | Payer: Medicare Other | Attending: Internal Medicine | Admitting: Internal Medicine

## 2016-09-04 ENCOUNTER — Emergency Department (HOSPITAL_COMMUNITY): Payer: Medicare Other

## 2016-09-04 ENCOUNTER — Encounter (HOSPITAL_COMMUNITY): Payer: Self-pay | Admitting: Emergency Medicine

## 2016-09-04 DIAGNOSIS — G9341 Metabolic encephalopathy: Secondary | ICD-10-CM | POA: Diagnosis present

## 2016-09-04 DIAGNOSIS — N39490 Overflow incontinence: Secondary | ICD-10-CM | POA: Diagnosis present

## 2016-09-04 DIAGNOSIS — I10 Essential (primary) hypertension: Secondary | ICD-10-CM | POA: Diagnosis not present

## 2016-09-04 DIAGNOSIS — S72002A Fracture of unspecified part of neck of left femur, initial encounter for closed fracture: Secondary | ICD-10-CM | POA: Diagnosis not present

## 2016-09-04 DIAGNOSIS — I951 Orthostatic hypotension: Secondary | ICD-10-CM | POA: Diagnosis not present

## 2016-09-04 DIAGNOSIS — Z8249 Family history of ischemic heart disease and other diseases of the circulatory system: Secondary | ICD-10-CM

## 2016-09-04 DIAGNOSIS — Z79899 Other long term (current) drug therapy: Secondary | ICD-10-CM

## 2016-09-04 DIAGNOSIS — S72112A Displaced fracture of greater trochanter of left femur, initial encounter for closed fracture: Principal | ICD-10-CM | POA: Diagnosis present

## 2016-09-04 DIAGNOSIS — K5641 Fecal impaction: Secondary | ICD-10-CM | POA: Diagnosis present

## 2016-09-04 DIAGNOSIS — E785 Hyperlipidemia, unspecified: Secondary | ICD-10-CM | POA: Diagnosis not present

## 2016-09-04 DIAGNOSIS — Y92238 Other place in hospital as the place of occurrence of the external cause: Secondary | ICD-10-CM | POA: Diagnosis present

## 2016-09-04 DIAGNOSIS — M9702XA Periprosthetic fracture around internal prosthetic left hip joint, initial encounter: Secondary | ICD-10-CM | POA: Diagnosis present

## 2016-09-04 DIAGNOSIS — K6389 Other specified diseases of intestine: Secondary | ICD-10-CM | POA: Diagnosis not present

## 2016-09-04 DIAGNOSIS — S728X2A Other fracture of left femur, initial encounter for closed fracture: Secondary | ICD-10-CM | POA: Diagnosis not present

## 2016-09-04 DIAGNOSIS — M25559 Pain in unspecified hip: Secondary | ICD-10-CM | POA: Diagnosis not present

## 2016-09-04 DIAGNOSIS — R109 Unspecified abdominal pain: Secondary | ICD-10-CM | POA: Diagnosis not present

## 2016-09-04 DIAGNOSIS — N401 Enlarged prostate with lower urinary tract symptoms: Secondary | ICD-10-CM | POA: Diagnosis not present

## 2016-09-04 DIAGNOSIS — K5900 Constipation, unspecified: Secondary | ICD-10-CM | POA: Diagnosis not present

## 2016-09-04 DIAGNOSIS — N400 Enlarged prostate without lower urinary tract symptoms: Secondary | ICD-10-CM | POA: Diagnosis not present

## 2016-09-04 DIAGNOSIS — Z91011 Allergy to milk products: Secondary | ICD-10-CM | POA: Diagnosis not present

## 2016-09-04 DIAGNOSIS — R338 Other retention of urine: Secondary | ICD-10-CM | POA: Diagnosis not present

## 2016-09-04 DIAGNOSIS — W19XXXA Unspecified fall, initial encounter: Secondary | ICD-10-CM | POA: Diagnosis present

## 2016-09-04 DIAGNOSIS — G9340 Encephalopathy, unspecified: Secondary | ICD-10-CM | POA: Diagnosis not present

## 2016-09-04 DIAGNOSIS — Z7982 Long term (current) use of aspirin: Secondary | ICD-10-CM | POA: Diagnosis not present

## 2016-09-04 DIAGNOSIS — S72002D Fracture of unspecified part of neck of left femur, subsequent encounter for closed fracture with routine healing: Secondary | ICD-10-CM | POA: Diagnosis not present

## 2016-09-04 DIAGNOSIS — Z96643 Presence of artificial hip joint, bilateral: Secondary | ICD-10-CM | POA: Diagnosis present

## 2016-09-04 DIAGNOSIS — D62 Acute posthemorrhagic anemia: Secondary | ICD-10-CM | POA: Diagnosis present

## 2016-09-04 DIAGNOSIS — S72009A Fracture of unspecified part of neck of unspecified femur, initial encounter for closed fracture: Secondary | ICD-10-CM | POA: Diagnosis not present

## 2016-09-04 DIAGNOSIS — W010XXA Fall on same level from slipping, tripping and stumbling without subsequent striking against object, initial encounter: Secondary | ICD-10-CM | POA: Diagnosis present

## 2016-09-04 DIAGNOSIS — D72829 Elevated white blood cell count, unspecified: Secondary | ICD-10-CM | POA: Diagnosis present

## 2016-09-04 DIAGNOSIS — R278 Other lack of coordination: Secondary | ICD-10-CM | POA: Diagnosis not present

## 2016-09-04 DIAGNOSIS — W19XXXD Unspecified fall, subsequent encounter: Secondary | ICD-10-CM | POA: Diagnosis not present

## 2016-09-04 DIAGNOSIS — Z96651 Presence of right artificial knee joint: Secondary | ICD-10-CM | POA: Diagnosis present

## 2016-09-04 DIAGNOSIS — H5462 Unqualified visual loss, left eye, normal vision right eye: Secondary | ICD-10-CM | POA: Diagnosis present

## 2016-09-04 DIAGNOSIS — M25552 Pain in left hip: Secondary | ICD-10-CM | POA: Diagnosis not present

## 2016-09-04 DIAGNOSIS — R2689 Other abnormalities of gait and mobility: Secondary | ICD-10-CM | POA: Diagnosis not present

## 2016-09-04 DIAGNOSIS — Z87891 Personal history of nicotine dependence: Secondary | ICD-10-CM

## 2016-09-04 DIAGNOSIS — E039 Hypothyroidism, unspecified: Secondary | ICD-10-CM | POA: Diagnosis present

## 2016-09-04 DIAGNOSIS — R41841 Cognitive communication deficit: Secondary | ICD-10-CM | POA: Diagnosis not present

## 2016-09-04 DIAGNOSIS — S72352A Displaced comminuted fracture of shaft of left femur, initial encounter for closed fracture: Secondary | ICD-10-CM | POA: Diagnosis not present

## 2016-09-04 DIAGNOSIS — Z96642 Presence of left artificial hip joint: Secondary | ICD-10-CM | POA: Diagnosis not present

## 2016-09-04 DIAGNOSIS — M6281 Muscle weakness (generalized): Secondary | ICD-10-CM | POA: Diagnosis not present

## 2016-09-04 HISTORY — DX: Essential (primary) hypertension: I10

## 2016-09-04 HISTORY — DX: Benign prostatic hyperplasia without lower urinary tract symptoms: N40.0

## 2016-09-04 HISTORY — DX: Hyperlipidemia, unspecified: E78.5

## 2016-09-04 HISTORY — DX: Hypothyroidism, unspecified: E03.9

## 2016-09-04 LAB — PROTIME-INR
INR: 1.12
Prothrombin Time: 14.4 seconds (ref 11.4–15.2)

## 2016-09-04 LAB — BASIC METABOLIC PANEL
Anion gap: 8 (ref 5–15)
BUN: 18 mg/dL (ref 6–20)
CHLORIDE: 107 mmol/L (ref 101–111)
CO2: 26 mmol/L (ref 22–32)
CREATININE: 0.74 mg/dL (ref 0.61–1.24)
Calcium: 8.9 mg/dL (ref 8.9–10.3)
GFR calc Af Amer: >60 mL/min (ref >60)
GFR calc non Af Amer: >60 mL/min (ref >60)
Glucose, Bld: 142 mg/dL — ABNORMAL HIGH (ref 65–99)
Potassium: 4.5 mmol/L (ref 3.5–5.1)
Sodium: 141 mmol/L (ref 135–145)

## 2016-09-04 LAB — CBC WITH DIFFERENTIAL/PLATELET
BASOS PCT: 0 %
Basophils Absolute: 0 10*3/uL (ref 0.0–0.1)
EOS PCT: 0 %
Eosinophils Absolute: 0 10*3/uL (ref 0.0–0.7)
HCT: 38 % — ABNORMAL LOW (ref 39.0–52.0)
Hemoglobin: 12.6 g/dL — ABNORMAL LOW (ref 13.0–17.0)
Lymphocytes Relative: 3 %
Lymphs Abs: 0.6 10*3/uL — ABNORMAL LOW (ref 0.7–4.0)
MCH: 29.6 pg (ref 26.0–34.0)
MCHC: 33.2 g/dL (ref 30.0–36.0)
MCV: 89.2 fL (ref 78.0–100.0)
MONO ABS: 1 10*3/uL (ref 0.1–1.0)
Monocytes Relative: 5 %
NEUTROS PCT: 92 %
Neutro Abs: 17.5 10*3/uL — ABNORMAL HIGH (ref 1.7–7.7)
PLATELETS: 218 10*3/uL (ref 150–400)
RBC: 4.26 MIL/uL (ref 4.22–5.81)
RDW: 13.4 % (ref 11.5–15.5)
WBC: 19.1 10*3/uL — ABNORMAL HIGH (ref 4.0–10.5)

## 2016-09-04 LAB — I-STAT CHEM 8, ED
BUN: 17 mg/dL (ref 6–20)
CALCIUM ION: 1.16 mmol/L (ref 1.15–1.40)
CHLORIDE: 104 mmol/L (ref 101–111)
CREATININE: 0.8 mg/dL (ref 0.61–1.24)
GLUCOSE: 161 mg/dL — AB (ref 65–99)
HCT: 36 % — ABNORMAL LOW (ref 39.0–52.0)
Hemoglobin: 12.2 g/dL — ABNORMAL LOW (ref 13.0–17.0)
Potassium: 3.7 mmol/L (ref 3.5–5.1)
Sodium: 141 mmol/L (ref 135–145)
TCO2: 25 mmol/L (ref 0–100)

## 2016-09-04 LAB — TYPE AND SCREEN
ABO/RH(D): A POS
ANTIBODY SCREEN: NEGATIVE

## 2016-09-04 MED ORDER — ALFUZOSIN HCL ER 10 MG PO TB24
10.0000 mg | ORAL_TABLET | Freq: Every day | ORAL | Status: DC
  Administered 2016-09-05 – 2016-09-08 (×4): 10 mg via ORAL
  Filled 2016-09-04 (×5): qty 1

## 2016-09-04 MED ORDER — FENTANYL CITRATE (PF) 100 MCG/2ML IJ SOLN: 50.0000 ug | INTRAMUSCULAR | Status: DC | PRN

## 2016-09-04 MED ORDER — SENNOSIDES-DOCUSATE SODIUM 8.6-50 MG PO TABS
1.0000 | ORAL_TABLET | Freq: Two times a day (BID) | ORAL | Status: DC
  Administered 2016-09-05 – 2016-09-07 (×6): 1 via ORAL
  Filled 2016-09-04 (×6): qty 1

## 2016-09-04 MED ORDER — ONDANSETRON HCL 4 MG/2ML IJ SOLN
4.0000 mg | Freq: Once | INTRAMUSCULAR | Status: AC
  Administered 2016-09-04: 4 mg via INTRAVENOUS
  Filled 2016-09-04: qty 2

## 2016-09-04 MED ORDER — POLYETHYLENE GLYCOL 3350 17 G PO PACK
17.0000 g | PACK | Freq: Every day | ORAL | Status: DC
  Administered 2016-09-05 – 2016-09-07 (×3): 17 g via ORAL
  Filled 2016-09-04 (×3): qty 1

## 2016-09-04 MED ORDER — LEVOTHYROXINE SODIUM 100 MCG PO TABS
100.0000 ug | ORAL_TABLET | Freq: Every day | ORAL | Status: DC
  Administered 2016-09-05 – 2016-09-08 (×4): 100 ug via ORAL
  Filled 2016-09-04 (×4): qty 1

## 2016-09-04 MED ORDER — OXYCODONE-ACETAMINOPHEN 5-325 MG PO TABS
1.0000 | ORAL_TABLET | ORAL | Status: DC | PRN
  Administered 2016-09-05 (×2): 1 via ORAL
  Filled 2016-09-04 (×2): qty 1

## 2016-09-04 MED ORDER — HEPARIN SODIUM (PORCINE) 5000 UNIT/ML IJ SOLN
5000.0000 [IU] | Freq: Three times a day (TID) | INTRAMUSCULAR | Status: DC
  Administered 2016-09-05 – 2016-09-08 (×9): 5000 [IU] via SUBCUTANEOUS
  Filled 2016-09-04 (×9): qty 1

## 2016-09-04 MED ORDER — LISINOPRIL 10 MG PO TABS
10.0000 mg | ORAL_TABLET | Freq: Every day | ORAL | Status: DC
  Administered 2016-09-05: 10 mg via ORAL
  Filled 2016-09-04 (×2): qty 1

## 2016-09-04 MED ORDER — SODIUM CHLORIDE 0.9 % IV SOLN
Freq: Once | INTRAVENOUS | Status: DC
  Administered 2016-09-04: 21:00:00 via INTRAVENOUS

## 2016-09-04 MED ORDER — ONDANSETRON HCL 4 MG/2ML IJ SOLN: 4.0000 mg | Freq: Three times a day (TID) | INTRAMUSCULAR | Status: DC | PRN

## 2016-09-04 MED ORDER — ZOLPIDEM TARTRATE 5 MG PO TABS: 5.0000 mg | ORAL_TABLET | Freq: Every evening | ORAL | Status: DC | PRN

## 2016-09-04 MED ORDER — FENTANYL CITRATE (PF) 100 MCG/2ML IJ SOLN
50.0000 ug | Freq: Once | INTRAMUSCULAR | Status: AC
  Administered 2016-09-04: 50 ug via INTRAVENOUS
  Filled 2016-09-04: qty 2

## 2016-09-04 MED ORDER — ENOXAPARIN SODIUM 40 MG/0.4ML ~~LOC~~ SOLN: 40.0000 mg | SUBCUTANEOUS | Status: DC

## 2016-09-04 MED ORDER — METHOCARBAMOL 500 MG PO TABS
500.0000 mg | ORAL_TABLET | Freq: Three times a day (TID) | ORAL | Status: DC | PRN
  Administered 2016-09-05 – 2016-09-07 (×2): 500 mg via ORAL
  Filled 2016-09-04 (×2): qty 1

## 2016-09-04 MED ORDER — MORPHINE SULFATE (PF) 2 MG/ML IV SOLN: 0.5000 mg | INTRAVENOUS | Status: DC | PRN

## 2016-09-04 MED ORDER — ADULT MULTIVITAMIN W/MINERALS CH
1.0000 | ORAL_TABLET | Freq: Every day | ORAL | Status: DC
Start: 2016-09-05 — End: 2016-09-08
  Administered 2016-09-05 – 2016-09-08 (×4): 1 via ORAL
  Filled 2016-09-04 (×4): qty 1

## 2016-09-04 MED ORDER — MILK AND MOLASSES ENEMA
1.0000 | Freq: Once | RECTAL | Status: AC
  Administered 2016-09-04: 250 mL via RECTAL
  Filled 2016-09-04: qty 250

## 2016-09-04 MED ORDER — ATORVASTATIN CALCIUM 20 MG PO TABS
20.0000 mg | ORAL_TABLET | Freq: Every day | ORAL | Status: DC
  Administered 2016-09-05 – 2016-09-08 (×4): 20 mg via ORAL
  Filled 2016-09-04 (×4): qty 1

## 2016-09-04 MED ORDER — HYDROXYZINE HCL 10 MG PO TABS
10.0000 mg | ORAL_TABLET | Freq: Three times a day (TID) | ORAL | Status: DC | PRN
  Filled 2016-09-04: qty 1

## 2016-09-04 NOTE — ED Provider Notes (Signed)
[facility] DEPT Provider Note   CSN: [id number] Arrival date & time: 09/04/16  1343     History   Chief Complaint Chief Complaint  Patient presents with  . Constipation    HPI Gene Hunter is a 83 y.o. male.  HPI Gene Hunter is a 83 y.o. male with no medical problems, presents to ED with complaint of constipation. Patient states that he has not had a bowel movement in 4 days. He reports pain impression his rectum. He reports some bleeding when he tries to have a bowel movement just in his rectum. He denies any nausea or vomiting. No abdominal pain. He reports similar symptoms one week ago, at that time he states he manually disimpacted himself. He tried to disimpact himself this time but it did not work. He reports taking Maalox and drinking lots of water with no relief. No fever, chills, no malaise.   Past Medical History:  Diagnosis Date  . Blindness of right eye     There are no active problems to display for this patient.   Past Surgical History:  Procedure Laterality Date  . bilateral hip replacement    . HERNIA REPAIR    . JOINT REPLACEMENT    . right knee replacement         Home Medications    Prior to Admission medications   Medication Sig Start Date End Date Taking? Authorizing Provider  aspirin (ASPIRIN EC) 81 MG EC tablet Take 81 mg by mouth every other day. Swallow whole.    Historical Provider, MD  atorvastatin (LIPITOR) 20 MG tablet Take 20 mg by mouth daily.    Historical Provider, MD  levothyroxine (LEVOXYL) 100 MCG tablet Take 100 mcg by mouth daily.    Historical Provider, MD  lisinopril (PRINIVIL,ZESTRIL) 10 MG tablet Take 10 mg by mouth daily.    Historical Provider, MD  metFORMIN (GLUCOPHAGE) 500 MG tablet Take 500 mg by mouth daily.    Historical Provider, MD  Multiple Vitamins-Minerals (MULTIVITAMIN WITH MINERALS) tablet Take 1 tablet by mouth daily.    Historical Provider, MD  zolpidem (AMBIEN) 5 MG tablet Take 5 mg by mouth at  bedtime as needed. sleep    Historical Provider, MD    Family History History reviewed. No pertinent family history.  Social History Social History  Substance Use Topics  . Smoking status: Former Research scientist (life sciences)  . Smokeless tobacco: Not on file  . Alcohol use No     Allergies   Milk-related compounds   Review of Systems Review of Systems  Constitutional: Negative for chills and fever.  Respiratory: Negative for cough, chest tightness and shortness of breath.   Cardiovascular: Negative for chest pain, palpitations and leg swelling.  Gastrointestinal: Positive for blood in stool and constipation. Negative for abdominal distention, abdominal pain, diarrhea, nausea and vomiting.  Genitourinary: Negative for dysuria, frequency, hematuria and urgency.  Musculoskeletal: Negative for arthralgias, myalgias, neck pain and neck stiffness.  Skin: Negative for rash.  Allergic/Immunologic: Negative for immunocompromised state.  Neurological: Negative for dizziness, weakness, light-headedness, numbness and headaches.  All other systems reviewed and are negative.    Physical Exam Updated Vital Signs BP 130/57 (BP Location: Right Arm)   Pulse 75   Temp 97.7 F (36.5 C) (Oral)   Resp 18   Ht 6' (1.829 m)   Wt 119.3 kg   SpO2 98%   BMI 35.67 kg/m   Physical Exam  Constitutional: He appears well-developed and well-nourished.  Pt is pacing in  the room, states painful to sit down due to fecal impaction  HENT:  Head: Normocephalic and atraumatic.  Eyes: Conjunctivae are normal.  Neck: Neck supple.  Cardiovascular: Normal rate, regular rhythm and normal heart sounds.   Pulmonary/Chest: Effort normal. No respiratory distress. He has no wheezes. He has no rales.  Abdominal: Soft. Bowel sounds are normal. He exhibits no distension. There is no tenderness. There is no rebound.  Genitourinary:  Genitourinary Comments: Rectum normal. Fecal impaction. Bright red blood on finger during rectal exam.  Stool brown, hard  Musculoskeletal: He exhibits no edema.  Neurological: He is alert.  Skin: Skin is warm and dry.  Nursing note and vitals reviewed.    ED Treatments / Results  Labs (all labs ordered are listed, but only abnormal results are displayed) Labs Reviewed  BASIC METABOLIC PANEL - Abnormal; Notable for the following:       Result Value   Glucose, Bld 142 (*)    All other components within normal limits  CBC WITH DIFFERENTIAL/PLATELET - Abnormal; Notable for the following:    WBC 19.1 (*)    Hemoglobin 12.6 (*)    HCT 38.0 (*)    Neutro Abs 17.5 (*)    Lymphs Abs 0.6 (*)    All other components within normal limits  I-STAT CHEM 8, ED - Abnormal; Notable for the following:    Glucose, Bld 161 (*)    Hemoglobin 12.2 (*)    HCT 36.0 (*)    All other components within normal limits  PROTIME-INR  URINALYSIS, ROUTINE W REFLEX MICROSCOPIC  TYPE AND SCREEN    EKG  EKG Interpretation None       Radiology Ct Hip Left Wo Contrast  Result Date: 09/04/2016 CLINICAL DATA:  Left femoral fracture EXAM: CT OF THE LEFT HIP WITHOUT CONTRAST TECHNIQUE: Multidetector CT imaging of the left hip was performed according to the standard protocol. Multiplanar CT image reconstructions were also generated. COMPARISON:  Radiographs of the left hip from 09/09/2011 FINDINGS: Bones/Joint/Cartilage Acute, closed, comminuted fracture of the proximal femur with main coronal oblique fracture fragment seen through the sub trochanteric portion of the femur crossing at the level of the mid prosthetic femoral stem and displaced dorsally approximately one-quarter shaft width. A nondisplaced greater trochanteric fracture is noted without avulsion. 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 No dislocation of the femoral head prosthesis. No fracture of the adjacent pubic rami nor acetabulum. Ligaments Suboptimally assessed by CT. Muscles and Tendons No intramuscular hematoma. Soft tissues  Mild posttraumatic edema. IMPRESSION: Acute, closed, comminuted fracture of the proximal left femur with main coronal oblique fracture fragment seen traversing through the sub trochanteric portion of the femur, crossing at the level of the mid prosthetic femoral stem of an uncemented left hip arthroplasty and displaced dorsally approximately one-quarter shaft width. A nondisplaced greater trochanteric fracture is noted without avulsion. Electronically Signed   By: Ashley Royalty M.D.   On: 09/04/2016 20:32   Dg Chest Portable 1 View  Result Date: 09/04/2016 CLINICAL DATA:  Known left hip fracture preop. EXAM: PORTABLE CHEST 1 VIEW COMPARISON:  09/11/2010 FINDINGS: Lungs are adequately inflated without focal consolidation or effusion. Mild eventration of the left hemidiaphragm. Cardiomediastinal silhouette and remainder of the exam is unchanged. IMPRESSION: No active disease. Electronically Signed   By: Marin Olp M.D.   On: 09/04/2016 19:38   Dg Femur Min 2 Views Left  Result Date: 09/04/2016 CLINICAL DATA:  Recent fall with with leg pain, initial encounter  EXAM: LEFT FEMUR 2 VIEWS COMPARISON:  None. FINDINGS: Left hip prosthesis is noted. No dislocation is seen. There is a periprosthetic fracture of the greater trochanter with all displacement identified. Degenerative changes of the left knee joint are seen. No focal abnormality is noted. IMPRESSION: Proximal left femoral fracture involving the greater trochanter surrounding the prosthesis. Electronically Signed   By: Inez Catalina M.D.   On: 09/04/2016 18:49    Procedures Procedures (including critical care time)  Medications Ordered in ED Medications  milk and molasses enema (not administered)     Initial Impression / Assessment and Plan / ED Course  I have reviewed the triage vital signs and the nursing notes.  Pertinent labs & imaging results that were available during my care of the patient were reviewed by me and considered in my medical  decision making (see chart for details).     Patient in emergency department with constipation. No nausea, vomiting, no abdominal pain. Rectal exam showed fecal impaction. Abdomen is soft, benign. I manually disimpacted large amount of stool, stool is brown, there are some bloody streaks which I suspect them are most likely from internal hemorrhoids. Patient still has a large amount of stool in his rectum on exam, and he is unable to tolerate disimpaction anymore. We will try an enema.  I was told by RN and also Dr. Venora Maples that while on bedside commode after an enema, patient tried to get up, slipped and fell onto his left hip. Patient with prior left hip replacement. Patient is complaining of pain. Will get x-rays.    Patient with proximal femoral fracture involving the greater trochanter surrounding the prosthesis. I discussed patient with Dr. Lillia Corporal who is on-call for Dr. Alvan Dame, patient's orthopedic surgeon. He advised to get a CT scan and if patient needs pain management, admit to medicine.   9:04 PM Attempted to call [name] family, no one to pick up phone. Ferd Musa [phone], Viviano Simas - girl friend- [phone].   Spoke with Dr. Blaine Hamper, will admit pt.   Final Clinical Impressions(s) / ED Diagnoses   Final diagnoses:  Closed fracture of left hip, initial encounter ([place])  Fecal impaction in rectum ([place])  Essential hypertension  Hyperlipidemia, unspecified hyperlipidemia type  Hypothyroidism, unspecified type    New Prescriptions New Prescriptions   No medications on file     Jeannett Senior, PA-C 09/04/16 2318    Jola Schmidt, MD 09/05/16 [phone]

## 2016-09-04 NOTE — ED Triage Notes (Signed)
Pt has had only one BM this week and reports discomfort in his rectum.  This is the 2nd episode of constipation in the last 3 weeks

## 2016-09-04 NOTE — Consult Note (Signed)
ORTHOPAEDIC CONSULTATION  REQUESTING PHYSICIAN: Jola Schmidt, MD  PCP:  Horatio Pel, MD  Chief Complaint: Periprosthetic left femur fracture  HPI: Gene Hunter is a 83 y.o. male who presented to the emergency department at [facility] for constipation. He was ambulating in the room from the commode to the bed, when he fell and landed on his left hip. He denies other injuries. He complains of left hip pain. He is able to weight-bear with some pain. X-rays were obtained showing a minimally displaced greater trochanter fracture surrounding an ingrown press-fit total hip arthroplasty. He does have a history of left total hip arthroplasty by Dr. Alvan Dame in 2012. He denies any preceding hip pain or issues.      Past Medical History:  Diagnosis Date  . Blindness of right eye         Past Surgical History:  Procedure Laterality Date  . bilateral hip replacement    . HERNIA REPAIR    . JOINT REPLACEMENT    . right knee replacement     Social History        Social History  . Marital status: Widowed    Spouse name: N/A  . Number of children: N/A  . Years of education: N/A       Social History Main Topics  . Smoking status: Former Research scientist (life sciences)  . Smokeless tobacco: None  . Alcohol use No  . Drug use: No  . Sexual activity: Not Asked   Other Topics Concern  . None      Social History Narrative  . None   History reviewed. No pertinent family history.     Allergies  Allergen Reactions  . Milk-Related Compounds           Prior to Admission medications   Medication Sig Start Date End Date Taking? Authorizing Provider  alfuzosin (UROXATRAL) 10 MG 24 hr tablet Take 10 mg by mouth daily. 08/30/16  Yes Historical Provider, MD  aspirin (ASPIRIN EC) 81 MG EC tablet Take 81 mg by mouth daily. Swallow whole.    Yes Historical Provider, MD  atorvastatin (LIPITOR) 20 MG tablet Take 20 mg by mouth daily.   Yes Historical Provider, MD    levothyroxine (LEVOXYL) 100 MCG tablet Take 100 mcg by mouth daily.   Yes Historical Provider, MD  lisinopril (PRINIVIL,ZESTRIL) 10 MG tablet Take 10 mg by mouth daily.   Yes Historical Provider, MD  Multiple Vitamins-Minerals (MULTIVITAMIN WITH MINERALS) tablet Take 1 tablet by mouth daily.   Yes Historical Provider, MD  zolpidem (AMBIEN) 5 MG tablet Take 5 mg by mouth at bedtime as needed. sleep   Yes Historical Provider, MD    Imaging Results (Last 48 hours)  Dg Chest Portable 1 View  Result Date: 09/04/2016 CLINICAL DATA:  Known left hip fracture preop. EXAM: PORTABLE CHEST 1 VIEW COMPARISON:  09/11/2010 FINDINGS: Lungs are adequately inflated without focal consolidation or effusion. Mild eventration of the left hemidiaphragm. Cardiomediastinal silhouette and remainder of the exam is unchanged. IMPRESSION: No active disease. Electronically Signed   By: Marin Olp M.D.   On: 09/04/2016 19:38   Dg Femur Min 2 Views Left  Result Date: 09/04/2016 CLINICAL DATA:  Recent fall with with leg pain, initial encounter EXAM: LEFT FEMUR 2 VIEWS COMPARISON:  None. FINDINGS: Left hip prosthesis is noted. No dislocation is seen. There is a periprosthetic fracture of the greater trochanter with all displacement identified. Degenerative changes of the left knee joint are seen. No focal  abnormality is noted. IMPRESSION: Proximal left femoral fracture involving the greater trochanter surrounding the prosthesis. Electronically Signed   By: Inez Catalina M.D.   On: 09/04/2016 18:49     Positive ROS: All other systems have been reviewed and were otherwise negative with the exception of those mentioned in the HPI and as above.  Physical Exam: General: Alert, no acute distress Cardiovascular: No pedal edema Respiratory: No cyanosis, no use of accessory musculature GI: No organomegaly, abdomen is soft and non-tender Skin: No lesions in the area of chief complaint Neurologic: Sensation intact  distally Psychiatric: Patient is competent for consent with normal mood and affect Lymphatic: No axillary or cervical lymphadenopathy  MUSCULOSKELETAL: Examination of the left hip reveals a healed posterior incision no skin wounds or lesions. He does have some pain with logrolling of the hip. He has palpable pulses sensation is intact. Motor function is intact dorsiflexion, plantarflexion, and great toe extension.  Assessment: Left [place] [employer] periprosthetic left femur fracture  Plan: I discussed the findings with the patient. We will plan for nonoperative treatment. He will be 50% weightbearing left lower extremity with a walker. No active abduction for 6 weeks. The emergency department staff indicates that he will be admitted to the hospital for pain control. He is to receive physical and occupational therapy. He should follow-up in the office 2 weeks after discharge with Dr. Alvan Dame or myself.    Lissa Rowles, Horald Pollen, MD Cell [phone]    09/04/2016 8:22 PM

## 2016-09-04 NOTE — Progress Notes (Deleted)
ORTHOPAEDIC CONSULTATION  REQUESTING PHYSICIAN: Jola Schmidt, MD  PCP:  Horatio Pel, MD  Chief Complaint: Periprosthetic left femur fracture  HPI: Gene Hunter is a 83 y.o. male who presented to the emergency department at [facility] for constipation. He was ambulating in the room from the commode to the bed, when he fell and landed on his left hip. He denies other injuries. He complains of left hip pain. He is able to weight-bear with some pain. X-rays were obtained showing a minimally displaced greater trochanter fracture surrounding an ingrown press-fit total hip arthroplasty. He does have a history of left total hip arthroplasty by Dr. Alvan Dame in 2012. He denies any preceding hip pain or issues.  Past Medical History:  Diagnosis Date  . Blindness of right eye    Past Surgical History:  Procedure Laterality Date  . bilateral hip replacement    . HERNIA REPAIR    . JOINT REPLACEMENT    . right knee replacement     Social History   Social History  . Marital status: Widowed    Spouse name: N/A  . Number of children: N/A  . Years of education: N/A   Social History Main Topics  . Smoking status: Former Research scientist (life sciences)  . Smokeless tobacco: None  . Alcohol use No  . Drug use: No  . Sexual activity: Not Asked   Other Topics Concern  . None   Social History Narrative  . None   History reviewed. No pertinent family history. Allergies  Allergen Reactions  . Milk-Related Compounds    Prior to Admission medications   Medication Sig Start Date End Date Taking? Authorizing Provider  alfuzosin (UROXATRAL) 10 MG 24 hr tablet Take 10 mg by mouth daily. 08/30/16  Yes Historical Provider, MD  aspirin (ASPIRIN EC) 81 MG EC tablet Take 81 mg by mouth daily. Swallow whole.    Yes Historical Provider, MD  atorvastatin (LIPITOR) 20 MG tablet Take 20 mg by mouth daily.   Yes Historical Provider, MD  levothyroxine (LEVOXYL) 100 MCG tablet Take 100 mcg by mouth daily.   Yes  Historical Provider, MD  lisinopril (PRINIVIL,ZESTRIL) 10 MG tablet Take 10 mg by mouth daily.   Yes Historical Provider, MD  Multiple Vitamins-Minerals (MULTIVITAMIN WITH MINERALS) tablet Take 1 tablet by mouth daily.   Yes Historical Provider, MD  zolpidem (AMBIEN) 5 MG tablet Take 5 mg by mouth at bedtime as needed. sleep   Yes Historical Provider, MD   Dg Chest Portable 1 View  Result Date: 09/04/2016 CLINICAL DATA:  Known left hip fracture preop. EXAM: PORTABLE CHEST 1 VIEW COMPARISON:  09/11/2010 FINDINGS: Lungs are adequately inflated without focal consolidation or effusion. Mild eventration of the left hemidiaphragm. Cardiomediastinal silhouette and remainder of the exam is unchanged. IMPRESSION: No active disease. Electronically Signed   By: Marin Olp M.D.   On: 09/04/2016 19:38   Dg Femur Min 2 Views Left  Result Date: 09/04/2016 CLINICAL DATA:  Recent fall with with leg pain, initial encounter EXAM: LEFT FEMUR 2 VIEWS COMPARISON:  None. FINDINGS: Left hip prosthesis is noted. No dislocation is seen. There is a periprosthetic fracture of the greater trochanter with all displacement identified. Degenerative changes of the left knee joint are seen. No focal abnormality is noted. IMPRESSION: Proximal left femoral fracture involving the greater trochanter surrounding the prosthesis. Electronically Signed   By: Inez Catalina M.D.   On: 09/04/2016 18:49    Positive ROS: All other systems have been reviewed  and were otherwise negative with the exception of those mentioned in the HPI and as above.  Physical Exam: General: Alert, no acute distress Cardiovascular: No pedal edema Respiratory: No cyanosis, no use of accessory musculature GI: No organomegaly, abdomen is soft and non-tender Skin: No lesions in the area of chief complaint Neurologic: Sensation intact distally Psychiatric: Patient is competent for consent with normal mood and affect Lymphatic: No axillary or cervical  lymphadenopathy  MUSCULOSKELETAL: Examination of the left hip reveals a healed posterior incision no skin wounds or lesions. He does have some pain with logrolling of the hip. He has palpable pulses sensation is intact. Motor function is intact dorsiflexion, plantarflexion, and great toe extension.  Assessment: Left [place] [employer] periprosthetic left femur fracture  Plan: I discussed the findings with the patient. We will plan for nonoperative treatment. He will be 50% weightbearing left lower extremity with a walker. No active abduction for 6 weeks. The emergency department staff indicates that he will be admitted to the hospital for pain control. He is to receive physical and occupational therapy. He should follow-up in the office 2 weeks after discharge with Dr. Alvan Dame or myself.    Daney Moor, Horald Pollen, MD Cell [phone]    09/04/2016 8:22 PM

## 2016-09-04 NOTE — H&P (Addendum)
History and Physical    Gene Hunter [id number] DOB: [birth year] DOA: 09/04/2016  Referring MD/NP/PA:   PCP: Horatio Pel, MD   Patient coming from:  The patient is coming from home.  At baseline, pt is independent for most of ADL.  Chief Complaint: Constipation, fall and left hip pain  HPI: Gene Hunter is a 83 y.o. male with medical history significant of hypertension, hyperlipidemia, hypothyroidism, BPH, who presents with constipation, fall and left hip pain.  Patient initially presented to the ED because constipation for 4 days. Denies nausea, vomiting, diarrhea or abdominal pain. He reports pain impression his rectum. He reports some bleeding when he tries to have a bowel movement just in his rectum. He reports taking Maalox and drinking lots of water with no relief. Pt was treated with enema in Ed with relief. When was ambulating in ED room from the commode to the bed, he fell and landed on his left hip, cuusing mild pain in left upper leg and hip. He denies head or neck injury. He is able to weight-bear with some pain. NO LOC. He has a history of left total hip arthroplasty by Dr. Alvan Dame in 2012.   ED Course: pt was found to have WBC 19.1, INR 1.12, electrolytes renal function okay, negative chest x-ray, temperature normal, no tachycardia, O2 sat normal on room air. X-rays were obtained showing a minimally displaced greater trochanter fracture surrounding an ingrown press-fit total hip arthroplasty. Ortho, dr. Lyla Glassing was consulted. Pt is admitted to med-surg bed as inpt.  # CT scan showed acute, closed, comminuted fracture of the proximal left femur with main coronal oblique fracture fragment seen traversing through the sub trochanteric portion of the femur, crossing at the level of the mid prosthetic femoral stem of an uncemented left hip arthroplasty and displaced dorsally approximately one-quarter shaft width. A nondisplaced greater trochanteric fracture is noted without  avulsion.  Review of Systems:   General: no fevers, chills, no changes in body weight, has fatigue HEENT: no hearing changes or sore throat Respiratory: no dyspnea, coughing, wheezing CV: no chest pain, no palpitations GI: no nausea, vomiting, abdominal pain, diarrhea, has constipation GU: no dysuria, burning on urination, increased urinary frequency, hematuria  Ext: no leg edema Neuro: no unilateral weakness, numbness, or tingling, no vision change or hearing loss Skin: no rash, no skin tear. MSK: has left upper leg and hip pain Heme: No easy bruising.  Travel history: No recent long distant travel.  Allergy:  Allergies  Allergen Reactions  . Milk-Related Compounds     Past Medical History:  Diagnosis Date  . Blindness of right eye   . BPH (benign prostatic hyperplasia)   . Essential hypertension   . HLD (hyperlipidemia)   . Hypothyroidism     Past Surgical History:  Procedure Laterality Date  . bilateral hip replacement    . HERNIA REPAIR    . JOINT REPLACEMENT    . right knee replacement      Social History:  reports that he has quit smoking. He does not have any smokeless tobacco history on file. He reports that he does not drink alcohol or use drugs.  Family History:  Family History  Problem Relation Age of Onset  . Heart disease Father      Prior to Admission medications   Medication Sig Start Date End Date Taking? Authorizing Provider  alfuzosin (UROXATRAL) 10 MG 24 hr tablet Take 10 mg by mouth daily. 08/30/16  Yes Historical Provider, MD  aspirin (ASPIRIN EC) 81 MG EC tablet Take 81 mg by mouth daily. Swallow whole.    Yes Historical Provider, MD  atorvastatin (LIPITOR) 20 MG tablet Take 20 mg by mouth daily.   Yes Historical Provider, MD  levothyroxine (LEVOXYL) 100 MCG tablet Take 100 mcg by mouth daily.   Yes Historical Provider, MD  lisinopril (PRINIVIL,ZESTRIL) 10 MG tablet Take 10 mg by mouth daily.   Yes Historical Provider, MD  Multiple  Vitamins-Minerals (MULTIVITAMIN WITH MINERALS) tablet Take 1 tablet by mouth daily.   Yes Historical Provider, MD  zolpidem (AMBIEN) 5 MG tablet Take 5 mg by mouth at bedtime as needed. sleep   Yes Historical Provider, MD    Physical Exam: Vitals:   09/04/16 1431 09/04/16 1441 09/04/16 1445  BP: 130/57    Pulse: 75    Resp: 18    Temp: 97.7 F (36.5 C)    TempSrc: Oral    SpO2: 98% 98% 98%  Weight:   119.3 kg (263 lb)  Height:   6' (1.829 m)   General: Not in acute distress HEENT:       Eyes: no scleral icterus.       ENT: No discharge from the ears and nose, no pharynx injection, no tonsillar enlargement.        Neck: No JVD, no bruit, no mass felt. Heme: No neck lymph node enlargement. Cardiac: S1/S2, RRR, No murmurs, No gallops or rubs. Respiratory: No rales, wheezing, rhonchi or rubs. GI: Soft, nondistended, nontender, no rebound pain, no organomegaly, BS present. GU: No hematuria Ext: No pitting leg edema bilaterally. 2+DP/PT pulse bilaterally. Musculoskeletal: has tenderness in left upper leg. Skin: No rashes.  Neuro: Alert, oriented X3, cranial nerves II-XII grossly intact, moves all extremities normally.  Psych: Patient is not psychotic, no suicidal or hemocidal ideation.  Labs on Admission: I have personally reviewed following labs and imaging studies  CBC:  Recent Labs Lab 09/04/16 1822 09/04/16 1950  WBC  --  19.1*  NEUTROABS  --  17.5*  HGB 12.2* 12.6*  HCT 36.0* 38.0*  MCV  --  89.2  PLT  --  99991111   Basic Metabolic Panel:  Recent Labs Lab 09/04/16 1822 09/04/16 1950  NA 141 141  K 3.7 4.5  CL 104 107  CO2  --  26  GLUCOSE 161* 142*  BUN 17 18  CREATININE 0.80 0.74  CALCIUM  --  8.9   GFR: Estimated Creatinine Clearance: 85.1 mL/min (by C-G formula based on SCr of 0.74 mg/dL). Liver Function Tests: No results for input(s): AST, ALT, ALKPHOS, BILITOT, PROT, ALBUMIN in the last 168 hours. No results for input(s): LIPASE, AMYLASE in the last  168 hours. No results for input(s): AMMONIA in the last 168 hours. Coagulation Profile:  Recent Labs Lab 09/04/16 1950  INR 1.12   Cardiac Enzymes: No results for input(s): CKTOTAL, CKMB, CKMBINDEX, TROPONINI in the last 168 hours. BNP (last 3 results) No results for input(s): PROBNP in the last 8760 hours. HbA1C: No results for input(s): HGBA1C in the last 72 hours. CBG: No results for input(s): GLUCAP in the last 168 hours. Lipid Profile: No results for input(s): CHOL, HDL, LDLCALC, TRIG, CHOLHDL, LDLDIRECT in the last 72 hours. Thyroid Function Tests: No results for input(s): TSH, T4TOTAL, FREET4, T3FREE, THYROIDAB in the last 72 hours. Anemia Panel: No results for input(s): VITAMINB12, FOLATE, FERRITIN, TIBC, IRON, RETICCTPCT in the last 72 hours. Urine analysis:    Component Value Date/Time   COLORURINE  YELLOW 09/11/2010 1210   APPEARANCEUR CLEAR 09/11/2010 1210   LABSPEC 1.020 09/11/2010 1210   PHURINE 6.0 09/11/2010 1210   GLUCOSEU NEGATIVE 10/09/2009 0939   HGBUR NEGATIVE 09/11/2010 1210   BILIRUBINUR NEGATIVE 09/11/2010 1210   KETONESUR NEGATIVE 09/11/2010 1210   PROTEINUR NEGATIVE 09/11/2010 1210   UROBILINOGEN 1.0 09/11/2010 1210   NITRITE NEGATIVE 09/11/2010 1210   LEUKOCYTESUR  09/11/2010 1210    NEGATIVE MICROSCOPIC NOT DONE ON URINES WITH NEGATIVE PROTEIN, BLOOD, LEUKOCYTES, NITRITE, OR GLUCOSE <1000 mg/dL.   Sepsis Labs: @LABRCNTIP (procalcitonin:4,lacticidven:4) )No results found for this or any previous visit (from the past 240 hour(s)).   Radiological Exams on Admission: Ct Hip Left Wo Contrast  Result Date: 09/04/2016 CLINICAL DATA:  Left femoral fracture EXAM: CT OF THE LEFT HIP WITHOUT CONTRAST TECHNIQUE: Multidetector CT imaging of the left hip was performed according to the standard protocol. Multiplanar CT image reconstructions were also generated. COMPARISON:  Radiographs of the left hip from 09/09/2011 FINDINGS: Bones/Joint/Cartilage Acute,  closed, comminuted fracture of the proximal femur with main coronal oblique fracture fragment seen through the sub trochanteric portion of the femur crossing at the level of the mid prosthetic femoral stem and displaced dorsally approximately one-quarter shaft width. A nondisplaced greater trochanteric fracture is noted without avulsion. 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 No dislocation of the femoral head prosthesis. No fracture of the adjacent pubic rami nor acetabulum. Ligaments Suboptimally assessed by CT. Muscles and Tendons No intramuscular hematoma. Soft tissues Mild posttraumatic edema. IMPRESSION: Acute, closed, comminuted fracture of the proximal left femur with main coronal oblique fracture fragment seen traversing through the sub trochanteric portion of the femur, crossing at the level of the mid prosthetic femoral stem of an uncemented left hip arthroplasty and displaced dorsally approximately one-quarter shaft width. A nondisplaced greater trochanteric fracture is noted without avulsion. Electronically Signed   By: Ashley Royalty M.D.   On: 09/04/2016 20:32   Dg Chest Portable 1 View  Result Date: 09/04/2016 CLINICAL DATA:  Known left hip fracture preop. EXAM: PORTABLE CHEST 1 VIEW COMPARISON:  09/11/2010 FINDINGS: Lungs are adequately inflated without focal consolidation or effusion. Mild eventration of the left hemidiaphragm. Cardiomediastinal silhouette and remainder of the exam is unchanged. IMPRESSION: No active disease. Electronically Signed   By: Marin Olp M.D.   On: 09/04/2016 19:38   Dg Femur Min 2 Views Left  Result Date: 09/04/2016 CLINICAL DATA:  Recent fall with with leg pain, initial encounter EXAM: LEFT FEMUR 2 VIEWS COMPARISON:  None. FINDINGS: Left hip prosthesis is noted. No dislocation is seen. There is a periprosthetic fracture of the greater trochanter with all displacement identified. Degenerative changes of the left knee joint are seen. No focal  abnormality is noted. IMPRESSION: Proximal left femoral fracture involving the greater trochanter surrounding the prosthesis. Electronically Signed   By: Inez Catalina M.D.   On: 09/04/2016 18:49     EKG: Independently reviewed.   Sinus rhythm, QTC 515. Bifascicular blockage which is old.  Assessment/Plan Principal Problem:   Closed fracture of left hip (HCC) Active Problems:   Constipation   Fall   Essential hypertension   HLD (hyperlipidemia)   Hypothyroidism   BPH (benign prostatic hyperplasia)   Closed fracture of left hip:  As evidenced by x-ray. Patient has mild pain now. No neurovascular compromise. Orthopedic surgeon was consulted. Dr. Lyla Glassing recommended conservative treatment. He should follow-up in the office 2 weeks after discharge with Dr. Alvan Dame or Dr. Lyla Glassing.  - will admit  to Med-surg bed as inpt as pt needed to go to rehabilitation facility - Pain control:  percocet - When necessary hydroxyzine for nausea - Robaxin for muscle spasm - Appreciated Dr. Lyla Glassing consultation - pt/ot  Fall: Mechanic accidental fall. No head or neck injury -pt/ot  Leukocytosis: Likely due to stress-induced demargination. Patient does not have signs of infection. -f/u [facility] -Follow-up CBC  Constipation: -had Enema in ED -MiraLAX and senna code  HTN: -Continue lisinopril  HLD: Last LDL was not on record -Continue home medications: Lipitor  Hypothyroidism: Last TSH was not on record -Continue home Synthroid  BPH: stable - Continue Alfuzosin  DVT ppx: SQ Heparin Code Status: Full code Family Communication: None at bed side.  Disposition Plan:  Anticipate discharge back to rehabilitation facility  Consults called:  Ortho, Dr. Lyla Glassing Admission status:  medical floor/inpt  Date of Service 09/04/2016    Ivor Costa [facility] Pager [phone]  If 7PM-7AM, please contact night-coverage [email or web address] Password [email or web address] 09/04/2016, 9:37 PM

## 2016-09-04 NOTE — ED Notes (Signed)
Report Called to Junior, RN, Pt room has been switched to [id number], and room is not available. RN will call back when room is cleaned.

## 2016-09-05 ENCOUNTER — Encounter (HOSPITAL_COMMUNITY): Payer: Self-pay | Admitting: Emergency Medicine

## 2016-09-05 DIAGNOSIS — I10 Essential (primary) hypertension: Secondary | ICD-10-CM

## 2016-09-05 DIAGNOSIS — N400 Enlarged prostate without lower urinary tract symptoms: Secondary | ICD-10-CM

## 2016-09-05 DIAGNOSIS — E039 Hypothyroidism, unspecified: Secondary | ICD-10-CM

## 2016-09-05 DIAGNOSIS — K5900 Constipation, unspecified: Secondary | ICD-10-CM

## 2016-09-05 DIAGNOSIS — E785 Hyperlipidemia, unspecified: Secondary | ICD-10-CM

## 2016-09-05 DIAGNOSIS — S72002D Fracture of unspecified part of neck of left femur, subsequent encounter for closed fracture with routine healing: Secondary | ICD-10-CM

## 2016-09-05 DIAGNOSIS — W19XXXD Unspecified fall, subsequent encounter: Secondary | ICD-10-CM

## 2016-09-05 MED ORDER — ACETAMINOPHEN 325 MG PO TABS: 650.0000 mg | ORAL_TABLET | Freq: Four times a day (QID) | ORAL | Status: DC | PRN
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
## 2016-09-05 NOTE — Evaluation (Signed)
Physical Therapy Evaluation Patient Details Name: Gene Hunter MRN: [id number] DOB: Feb 10, 1926 Today's Date: 09/05/2016   History of Present Illness  Pt admitted from ED s/p fall with L minimally displaced greater trochanter fx - non-op at this time per ortho consult.  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  Pt would benefit from follow up rehab at SNF level to maximize IND and safety prior to return home with ltd assist.    Follow Up Recommendations SNF    Equipment Recommendations  None recommended by PT    Recommendations for Other Services OT consult     Precautions / Restrictions Precautions Precautions: Fall Precaution Comments: Pt is impulsive Restrictions Weight Bearing Restrictions: Yes LLE Weight Bearing: Partial weight bearing LLE Partial Weight Bearing Percentage or Pounds: 50%      Mobility  Bed Mobility Overal bed mobility: Needs Assistance Bed Mobility: Supine to Sit     Supine to sit: Min assist     General bed mobility comments: min cues for sequence and use of R LE to self assist  Transfers Overall transfer level: Needs assistance Equipment used: Rolling walker (2 wheeled) Transfers: Sit to/from Stand Sit to Stand: Min assist         General transfer comment: cues for LE management and use of UEs to self assist  Ambulation/Gait Ambulation/Gait assistance: Min assist Ambulation Distance (Feet): 28 Feet Assistive device: Rolling walker (2 wheeled) Gait Pattern/deviations: Step-to pattern;Shuffle;Trunk flexed;Antalgic Gait velocity: decr Gait velocity interpretation: Below normal speed for age/gender General Gait Details: cues for posture, sequence, position from RW and increased UE WB to comply with PWB order  Stairs            Wheelchair Mobility    Modified Rankin  (Stroke Patients Only)       Balance Overall balance assessment: No apparent balance deficits (not formally assessed)                                           Pertinent Vitals/Pain Pain Assessment: Faces Faces Pain Scale: Hurts whole lot Pain Location: L hip/thigh with activity Pain Descriptors / Indicators: Sore Pain Intervention(s): Limited activity within patient's tolerance;Monitored during session;Premedicated before session (Pt refuses pain meds but accepts muscle relax, refuses ice )    Home Living Family/patient expects to be discharged to:: Private residence Living Arrangements: Alone   Type of Home: House Home Access: Stairs to enter Entrance Stairs-Rails: Right Entrance Stairs-Number of Steps: 6 Home Layout: One level Home Equipment: Environmental consultant - 2 wheels;Crutches      Prior Function Level of Independence: Independent               Hand Dominance        Extremity/Trunk Assessment   Upper Extremity Assessment Upper Extremity Assessment: Overall WFL for tasks assessed    Lower Extremity Assessment Lower Extremity Assessment: LLE deficits/detail       Communication   Communication: No difficulties  Cognition Arousal/Alertness: Awake/alert Behavior During Therapy: Impulsive Overall Cognitive Status: Within Functional Limits for tasks assessed                      General Comments  Exercises General Exercises - Lower Extremity Ankle Circles/Pumps: AROM;15 reps;Supine   Assessment/Plan    PT Assessment Patient needs continued PT services  PT Problem List Decreased strength;Decreased range of motion;Decreased activity tolerance;Decreased mobility;Decreased knowledge of use of DME;Pain          PT Treatment Interventions DME instruction;Gait training;Stair training;Functional mobility training;Therapeutic activities;Therapeutic exercise;Patient/family education    PT Goals (Current goals can be found in the Care  Plan section)  Acute Rehab PT Goals Patient Stated Goal: regain IND PT Goal Formulation: With patient Time For Goal Achievement: 09/11/16 Potential to Achieve Goals: Good    Frequency Min 5X/week   Barriers to discharge        Co-evaluation               End of Session Equipment Utilized During Treatment: Gait belt Activity Tolerance: Patient tolerated treatment well;Patient limited by pain Patient left: in chair;with call bell/phone within reach;with chair alarm set Nurse Communication: Mobility status         Time: [id number] PT Time Calculation (min) (ACUTE ONLY): 25 min   Charges:   PT Evaluation $PT Eval Low Complexity: 1 Procedure PT Treatments $Gait Training: 8-22 mins   PT G Codes:        Malisa Ruggiero 09/06/16, 12:37 PM

## 2016-09-05 NOTE — Progress Notes (Addendum)
Nurse paged provider to make provider aware of patient having scant output, 5 ml at a time. Son reports patient complaining with prostate problems for the last 2 weeks and patient told son he was going to doctor about prostate. When asked what prostate problems patient was having, son explains patient told him he was having prostate pain. Nurse is waiting for provider to call back.   Dr. Ara Kussmaul returned call to nurse. Dr. Ara Kussmaul is aware of patient voiding scant amounts of urine, 5 ml, twice this shift and the report of having prostate problems/pain. Per Dr. Ara Kussmaul, in and out cath patient if he gets to 45ml in bladder, confirmed by bladder scan. Nurse will enter order.

## 2016-09-05 NOTE — Progress Notes (Signed)
    Subjective:    Patient reports pain as 3 on 0-10 scale.   Denies CP or SOB.  Pt has foley in place. Positive flatus. Objective: Vital signs in last 24 hours: Temp:  [97.7 F (36.5 C)-98.4 F (36.9 C)] 98.4 F (36.9 C) (01/28 0530) Pulse Rate:  [75-83] 83 (01/28 0530) Resp:  [16-21] 18 (01/28 0530) BP: (103-130)/(43-68) 103/43 (01/28 0530) SpO2:  [97 %-99 %] 97 % (01/28 0530) Weight:  [119.3 kg (263 lb)] 119.3 kg (263 lb) (01/27 1445)  Intake/Output from previous day: 01/27 0701 - 01/28 0700 In: 60 [P.O.:60] Out: -  Intake/Output this shift: Total I/O In: 30 [P.O.:30] Out: -   Labs:  Recent Labs  09/04/16 1822 09/04/16 1950  HGB 12.2* 12.6*    Recent Labs  09/04/16 1822 09/04/16 1950  WBC  --  19.1*  RBC  --  4.26  HCT 36.0* 38.0*  PLT  --  218    Recent Labs  09/04/16 1822 09/04/16 1950  NA 141 141  K 3.7 4.5  CL 104 107  CO2  --  26  BUN 17 18  CREATININE 0.80 0.74  GLUCOSE 161* 142*  CALCIUM  --  8.9    Recent Labs  09/04/16 1950  INR 1.12    Physical Exam: Neurologically intact ABD soft Sensation intact distally Compartment soft  Assessment/Plan:    Advance diet  Consider conservative treatment Pt will f/u in clinic in two weeks with Dr. Lyla Glassing or Dr. Alvan Dame Pt managed by [facility], Darla Lesches for Dr. Melina Schools [facility] [phone] 09/05/2016, 9:17 AM

## 2016-09-05 NOTE — ED Notes (Signed)
Addendum: Pt is pacing in the room and requesting to use the bathroom. Also complaining of rectal pain stating "I have not used the bathroom in 3 days." He has been shown the closest bathroom and advised to change into hospital gown.

## 2016-09-05 NOTE — Progress Notes (Signed)
Blood pressure 88/42; bladder scan obtained reveals 427ml, as patient has not voided this shift, however only 480 ml of PO intake this shift. Pt denies dizziness, lightheadedness, skin warm and dry, pt alert and oriented x4, other VS WNL. Dr. Algis Liming paged regarding blood pressure.

## 2016-09-05 NOTE — Progress Notes (Signed)
Patients son and daughter-in-law came to visit patient. Son and daughter-in-law reports not knowing patient was here at hospital. Patient called telling them he was in [place] in a hotel and he had seen [employer]. Son and Daughter-in-law then came to see patient here at hospital. Nurse checked emergency contacts. Son's name was in system as emergency contact and son confirmed number in computer was a business number and asked nurse to update number and add daughter-in-law to emergency contact list. Patient agrees for nurse to update chart with son and daughter-in-law's contact information.  Patient agrees for nurse to share health information with son and daughter-in-law. Son and daughter-in-law aware of patient coming into emergency department for constipation and patient fell going to bathroom according to notes. Son and daughter-in-law aware of patient having left hip fracture that is nonoperable. Son and daughter-in-law agrees to speak to doctors tomorrow to get more information concerning patients diagnosis and plan of care.

## 2016-09-05 NOTE — ED Notes (Signed)
Addendum: Pt is pacing in the room when I when to do my initial nursing evaluation, he states "he needs to use the bathroom and that he has severe rectal pain." He has been shown the closest bathroom and advised to change into hospital gown once he get back.

## 2016-09-05 NOTE — Progress Notes (Signed)
Bladder Scan = 483ml. Pt is in no acute distress. No urge to urinate. Encouraging Fluids. Dr Janeann Merl advised via text pg.

## 2016-09-05 NOTE — ED Notes (Signed)
Addendum: While getting on bedside commode, pt states "he slid and fell." I was en route out of the room when pt called back. I assisted him off the floor and onto the commode, he c/o left arm pain. Denies head pain,injury or impact. While on the commode and as I did my post fall assessment, pt c/o of left thigh "soreness." I notified the attending EDP who promptly came to the room to assess patient. Ordered X-ray of the left thigh.

## 2016-09-05 NOTE — Progress Notes (Signed)
Manuel blood pressures taken bilateral arms. SPB increased to 100. Assisted pt to standing position at side of bed to attempt urination in urinal but pt unable to urinate. Pt states that he "doesnt feel like he needs to go". Dr. Janeann Merl notified via text pg.

## 2016-09-05 NOTE — Progress Notes (Signed)
Report to Arde, RN, to assume care of patient at this time. Informed receiving RN of hypotension (Dr. Janeann Merl aware) - assuming RN to recheck blood pressure manually in bilateral arms, and notify Dr. Janeann Merl if hypotension persists. Patient is in no acute distress, no complaints.

## 2016-09-06 ENCOUNTER — Inpatient Hospital Stay (HOSPITAL_COMMUNITY): Payer: Medicare Other

## 2016-09-06 DIAGNOSIS — N401 Enlarged prostate with lower urinary tract symptoms: Secondary | ICD-10-CM

## 2016-09-06 DIAGNOSIS — R338 Other retention of urine: Secondary | ICD-10-CM

## 2016-09-06 DIAGNOSIS — G9340 Encephalopathy, unspecified: Secondary | ICD-10-CM

## 2016-09-06 LAB — URINALYSIS, ROUTINE W REFLEX MICROSCOPIC
Bilirubin Urine: NEGATIVE
Glucose, UA: NEGATIVE mg/dL
HGB URINE DIPSTICK: NEGATIVE
KETONES UR: 5 mg/dL — AB
LEUKOCYTES UA: NEGATIVE
Nitrite: NEGATIVE
PROTEIN: NEGATIVE mg/dL
Specific Gravity, Urine: 1.025 (ref 1.005–1.030)
pH: 5 (ref 5.0–8.0)

## 2016-09-06 LAB — CBC
HCT: 27.8 % — ABNORMAL LOW (ref 39.0–52.0)
Hemoglobin: 9.4 g/dL — ABNORMAL LOW (ref 13.0–17.0)
MCH: 30.7 pg (ref 26.0–34.0)
MCHC: 33.8 g/dL (ref 30.0–36.0)
MCV: 90.8 fL (ref 78.0–100.0)
PLATELETS: 159 10*3/uL (ref 150–400)
RBC: 3.06 MIL/uL — AB (ref 4.22–5.81)
RDW: 13.8 % (ref 11.5–15.5)
WBC: 14 10*3/uL — ABNORMAL HIGH (ref 4.0–10.5)

## 2016-09-06 MED ORDER — MORPHINE SULFATE (PF) 2 MG/ML IV SOLN: 0.5000 mg | INTRAVENOUS | Status: DC | PRN

## 2016-09-06 MED ORDER — ACETAMINOPHEN 325 MG PO TABS
650.0000 mg | ORAL_TABLET | Freq: Four times a day (QID) | ORAL | Status: DC | PRN
  Administered 2016-09-07 (×2): 650 mg via ORAL
  Filled 2016-09-06 (×2): qty 2
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
## 2016-09-06 NOTE — Progress Notes (Signed)
Patient declined to order breakfast

## 2016-09-06 NOTE — NC FL2 (Signed)
[name] LEVEL OF CARE SCREENING TOOL     IDENTIFICATION  Patient Name: Gene Hunter Birthdate: 12/10/1925 Sex: male Admission Date (Current Location): 09/04/2016  [facility] and [place] Number:  Herbalist and Address:  [facility],  [place] [address], [place]      Provider Number: [id number]  Attending Physician Name and Address:  Modena Jansky, MD  Relative Name and Phone Number:       Current Level of Care: Hospital Recommended Level of Care: [place] Prior Approval Number:    Date Approved/Denied:   PASRR Number: [id number]  Discharge Plan: SNF    Current Diagnoses: Patient Active Problem List   Diagnosis Date Noted  . Closed fracture of left hip ([place]) 09/04/2016  . Constipation 09/04/2016  . Fall 09/04/2016  . BPH (benign prostatic hyperplasia) 09/04/2016  . Essential hypertension   . HLD (hyperlipidemia)   . Hypothyroidism     Orientation RESPIRATION BLADDER Height & Weight     Self, Place, Situation  Normal Continent Weight: 263 lb (119.3 kg) Height:  6' (182.9 cm)  BEHAVIORAL SYMPTOMS/MOOD NEUROLOGICAL BOWEL NUTRITION STATUS  Other (Comment) (no behaviors)   Continent Diet  AMBULATORY STATUS COMMUNICATION OF NEEDS Skin   Limited Assist Verbally Normal                       Personal Care Assistance Level of Assistance  Bathing, Feeding, Dressing Bathing Assistance: Limited assistance Feeding assistance: Independent Dressing Assistance: Limited assistance     Functional Limitations Info  Sight, Hearing, Speech Sight Info: Impaired Hearing Info: Adequate Speech Info: Adequate    SPECIAL CARE FACTORS FREQUENCY  PT (By licensed PT), OT (By licensed OT)     PT Frequency: 5x wk OT Frequency: 5x wk            Contractures Contractures Info: Not present    Additional Factors Info  Code Status Code Status Info: Full Code             Current Medications  (09/06/2016):  This is the current hospital active medication list Current Facility-Administered Medications  Medication Dose Route Frequency Provider Last Rate Last Dose  . acetaminophen (TYLENOL) tablet 650 mg  650 mg Oral Q6H PRN Modena Jansky, MD      . alfuzosin (UROXATRAL) 24 hr tablet 10 mg  10 mg Oral Daily Ivor Costa, MD   10 mg at 09/06/16 [id number]  . atorvastatin (LIPITOR) tablet 20 mg  20 mg Oral Daily Ivor Costa, MD   20 mg at 09/06/16 0937  . heparin injection 5,000 Units  5,000 Units Subcutaneous Q8H Ivor Costa, MD   5,000 Units at 09/06/16 0630  . levothyroxine (SYNTHROID, LEVOTHROID) tablet 100 mcg  100 mcg Oral QAC breakfast Ivor Costa, MD   100 mcg at 09/06/16 0835  . methocarbamol (ROBAXIN) tablet 500 mg  500 mg Oral Q8H PRN Ivor Costa, MD   500 mg at 09/05/16 0953  . morphine 2 MG/ML injection 0.5 mg  0.5 mg Intravenous Q4H PRN Modena Jansky, MD      . multivitamin with minerals tablet 1 tablet  1 tablet Oral Daily Ivor Costa, MD   1 tablet at 09/06/16 [phone]  . polyethylene glycol (MIRALAX / GLYCOLAX) packet 17 g  17 g Oral Daily Ivor Costa, MD   17 g at 09/06/16 0937  . senna-docusate (Senokot-S) tablet 1 tablet  1 tablet Oral BID Ivor Costa,  MD   1 tablet at 09/06/16 [id number]     Discharge Medications: Please see discharge summary for a list of discharge medications.  Relevant Imaging Results:  Relevant Lab Results:   Additional Information SS # [id number]  [name], [name], LCSW

## 2016-09-06 NOTE — Clinical Social Work Placement (Signed)
   CLINICAL SOCIAL WORK PLACEMENT  NOTE  Date:  09/06/2016  Patient Details  Name: Gene Hunter MRN: [id number] Date of Birth: Aug 20, 1925  Clinical Social Work is seeking post-discharge placement for this patient at the [place] level of care (*CSW will initial, date and re-position this form in  chart as items are completed):  Yes   Patient/family provided with [facility] Work Department's list of facilities offering this level of care within the geographic area requested by the patient (or if unable, by the patient's family).  Yes   Patient/family informed of their freedom to choose among providers that offer the needed level of care, that participate in Medicare, Medicaid or managed care program needed by the patient, have an available bed and are willing to accept the patient.  Yes   Patient/family informed of [facility]'s ownership interest in [facility] and [facility], as well as of the fact that they are under no obligation to receive care at these facilities.  PASRR submitted to EDS on 09/06/16     PASRR number received on 09/06/16     Existing PASRR number confirmed on       FL2 transmitted to all facilities in geographic area requested by pt/family on 09/06/16     FL2 transmitted to all facilities within larger geographic area on       Patient informed that his/her managed care company has contracts with or will negotiate with certain facilities, including the following:            Patient/family informed of bed offers received.  Patient chooses bed at       Physician recommends and patient chooses bed at      Patient to be transferred to   on  .  Patient to be transferred to facility by       Patient family notified on   of transfer.  Name of family member notified:        PHYSICIAN       Additional Comment:    _______________________________________________ Luretha Rued, [place] 09/06/2016,  4:11 PM

## 2016-09-06 NOTE — Progress Notes (Addendum)
Physical Therapy Treatment Patient Details Name: Gene Hunter MRN: [id number] DOB: [birth year] Today's Date: 09/06/2016    History of Present Illness Pt admitted from ED s/p fall with L minimally displaced greater trochanter fx - non-op at this time per ortho consult.  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  He knew he fell and that he "hurt" his hip.  He knew his son who was in the room.  Pt not aware of his current medical situation.  Educated on [facility] and need to use walker all times to ensure 50% WB.  Assisted OOB to amb to bathroom required 75% VC's on proper use of walker, PWB and safety.  Pt required step by step direction to transfer off toilet and go to the sink to wash his hands.  Pt masking his confusion with slight agitation. Son present and agrees, "this is not how my dad acts".  Assisted to recliner and applied chair alarm and ICE to L hip.    Follow Up Recommendations  SNF     Equipment Recommendations  None recommended by PT    Recommendations for Other Services       Precautions / Restrictions Precautions Precautions: Fall Precaution Comments: pt is confused Restrictions        No active ABduction Weight Bearing Restrictions: Yes LLE Weight Bearing: Partial weight bearing LLE Partial Weight Bearing Percentage or Pounds: 50%    Mobility  Bed Mobility Overal bed mobility: Needs Assistance Bed Mobility: Supine to Sit     Supine to sit: Min assist     General bed mobility comments: 75% VC's to complete supine to sit.  Assist L LE off bed to decrease c/o pain  Transfers Overall transfer level: Needs assistance Equipment used: Rolling walker (2 wheeled) Transfers: Sit to/from [facility] Sit to Stand: Min assist;Mod assist Stand pivot transfers: Min assist;Mod assist       General transfer comment: 75% VC's on proper tech, 50% WBing, safety with turn completion and hand  placement  Ambulation/Gait Ambulation/Gait assistance: Min assist Ambulation Distance (Feet): 22 Feet (11 feet X 2 to and from bathroom ) Assistive device: Rolling walker (2 wheeled) Gait Pattern/deviations: Step-to pattern;Shuffle;Trunk flexed;Antalgic Gait velocity: decr   General Gait Details: 50% VC's on proper walker to self distance and 75% VC's on PWB.  Pt unsteady and impulsive with gait.  Demonstrating increased frustration during session.     Stairs            Wheelchair Mobility    Modified Rankin (Stroke Patients Only)       Balance                                    Cognition Arousal/Alertness: Awake/alert Behavior During Therapy:  (intermittent confusion to current situation ) Overall Cognitive Status: Impaired/Different from baseline Area of Impairment: Memory;Safety/judgement;Awareness;Problem solving     Memory: Decreased recall of precautions (50% WB)   Safety/Judgement: Decreased awareness of safety   Problem Solving: Requires verbal cues General Comments: pt required repeat functional VC's to complete toileting and hand hygiene task    Exercises      General Comments        Pertinent Vitals/Pain Pain Assessment: Faces Faces Pain Scale: Hurts little more Pain Location: L hip/thigh with activity Pain Descriptors / Indicators: Discomfort;Grimacing  Pain Intervention(s): Monitored during session;Repositioned;Ice applied    Home Living                      Prior Function            PT Goals (current goals can now be found in the care plan section) Progress towards PT goals: Progressing toward goals    Frequency    Min 5X/week      PT Plan Current plan remains appropriate    Co-evaluation             End of Session Equipment Utilized During Treatment: Gait belt   Patient left: in chair;with call bell/phone within reach;with chair alarm set     Time: [id number] PT Time Calculation (min)  (ACUTE ONLY): 29 min  Charges:  $Gait Training: 8-22 mins $Therapeutic Activity: 8-22 mins                    G Codes:      Rica Koyanagi  PTA [facility]  Acute  Rehab Pager      [phone]

## 2016-09-06 NOTE — Progress Notes (Signed)
CSW met with pt / family this afternoon to assist with d/c planning. PT has recommended SNF at d/c. Pt / family are in agreement with this plan and SNF search has been initiated. CSW will meet with pt / family to review bed offers when offers are available.   Werner Lean LCSW [phone]

## 2016-09-06 NOTE — Evaluation (Signed)
Occupational Therapy Evaluation Patient Details Name: Gene Hunter MRN: [id number] DOB: 30-May-1926 Today's Date: 09/06/2016    History of Present Illness Pt admitted from ED s/p fall with L minimally displaced greater trochanter fx - non-op at this time per ortho consult.  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   Clinical Impression   Pt admitted constipation then fell in ED. Pt currently with functional limitations due to the deficits listed below (see OT Problem List).  Pt will benefit from skilled OT to increase their safety and independence with ADL and functional mobility for ADL to facilitate discharge to venue listed below.      Follow Up Recommendations  SNF;Home health OT;Supervision/Assistance - 24 hour          Precautions / Restrictions Precautions Precautions: Fall Precaution Comments: pt is confused Restrictions Weight Bearing Restrictions: Yes LLE Weight Bearing: Partial weight bearing LLE Partial Weight Bearing Percentage or Pounds: 50%      Mobility Bed Mobility Overal bed mobility: Needs Assistance Bed Mobility: Supine to Sit     Supine to sit: Min assist     General bed mobility comments: min cues for sequence and use of R LE to self assist  Transfers Overall transfer level: Needs assistance Equipment used: Rolling walker (2 wheeled) Transfers: Sit to/from Stand Sit to Stand: Min assist Stand pivot transfers: Min assist;Mod assist       General transfer comment: cues for LE management and use of UEs to self assist         ADL Overall ADL's : Needs assistance/impaired Eating/Feeding: Set up;Sitting   Grooming: Set up;Sitting   Upper Body Bathing: Set up;Sitting   Lower Body Bathing: Moderate assistance;Sit to/from stand;Cueing for safety;Cueing for sequencing   Upper Body Dressing : Set up;Sitting   Lower Body Dressing: Moderate assistance;Sit to/from stand;Cueing for safety;Cueing for sequencing   Toilet Transfer:  Minimal assistance;RW;Ambulation;Comfort height toilet   Toileting- Clothing Manipulation and Hygiene: Moderate assistance;Sit to/from stand;Cueing for safety;Cueing for compensatory techniques                         Pertinent Vitals/Pain Pain Assessment: Faces Pain Score: 2  Faces Pain Scale: Hurts little more Pain Location: L hip Pain Descriptors / Indicators: Tender Pain Intervention(s): Monitored during session        Extremity/Trunk Assessment Upper Extremity Assessment Upper Extremity Assessment: Overall WFL for tasks assessed           Communication Communication Communication: No difficulties   Cognition Arousal/Alertness: Awake/alert Behavior During Therapy: WFL for tasks assessed/performed Overall Cognitive Status: Within Functional Limits for tasks assessed Area of Impairment: Safety/judgement     Memory: Decreased recall of precautions (50% WB)   Safety/Judgement: Decreased awareness of safety   Problem Solving: Requires verbal cues General Comments: pt required repeat functional VC's to complete toileting and hand hygiene task              Home Living Family/patient expects to be discharged to:: Private residence Living Arrangements: Alone   Type of Home: House Home Access: Stairs to enter Technical brewer of Steps: 6 Entrance Stairs-Rails: Right Home Layout: One level               Home Equipment: Walker - 2 wheels;Crutches          Prior Functioning/Environment Level of Independence: Independent  OT Problem List: Decreased strength;Decreased activity tolerance;Decreased safety awareness   OT Treatment/Interventions: Self-care/ADL training;DME and/or AE instruction;Patient/family education    OT Goals(Current goals can be found in the care plan section) Acute Rehab OT Goals Patient Stated Goal: regain IND OT Goal Formulation: With patient Time For Goal Achievement: 2016-10-01 Potential to  Achieve Goals: Good  OT Frequency: Min 2X/week              End of Session Nurse Communication: Mobility status  Activity Tolerance: Patient tolerated treatment well Patient left: in chair;with family/visitor present;with chair alarm set   Time: [id number] OT Time Calculation (min): 27 min Charges:  OT General Charges $OT Visit: 1 Procedure OT Evaluation $OT Eval Moderate Complexity: 1 Procedure OT Treatments $Self Care/Home Management : 8-22 mins G-Codes:    Payton Mccallum D 10/01/16, 1:51 PM

## 2016-09-06 NOTE — Progress Notes (Signed)
Initial Nutrition Assessment  DOCUMENTATION CODES:   Obesity unspecified  INTERVENTION:   -Continue MVI daily -RD will monitor for PO intake and reassess need for supplement  NUTRITION DIAGNOSIS:   Inadequate oral intake related to poor appetite as evidenced by per patient/family report, meal completion < 25%.  GOAL:   Patient will meet greater than or equal to 90% of their needs  MONITOR:   PO intake, Labs, Weight trends, Skin, I & O's  REASON FOR ASSESSMENT:   Consult Assessment of nutrition requirement/status  ASSESSMENT:   Gene Hunter is a 83 y.o. male with medical history significant of hypertension, hyperlipidemia, hypothyroidism, BPH, who presents with constipation, fall and left hip pain.  Pt admitted with closed lt hip fx.  Per orthopedics notes, non-operative management is being pursued.   Spoke with pt and family member at bedside. Family member reports that pt has been very forgetful as of late. Pt shares that he eats 3 meals per day, but "doesn't eat a lot". Frequently consumed foods include soups and sandwiches. Pt family member reports he has not seen any meal trays delivered to pt since last night. Per RN documentation, pt refused breakfast this morning.   Pt family member reports that pt has lost weight based upon his appearance and advanced age. No wt hx available to further assess wt changes. Pt reports he is active and works on a [employer].   Nutrition-Focused physical exam completed. Findings are mild fat depletion, mild muscle depletion, and no edema. Suspect depletions are related to advanced age.   Discussed importance of good meal completion to promote healing. RD reviewed menu selection with pt and obtained food preferences. Pt gave RD permission to order dinner meal for pt (soup and sandwich per pt request). Pt declines nutritional supplements, stating "The only thing I will drink is water and I don't like ice cream".   Labs reviewed.    Diet Order:  Diet regular Room service appropriate? Yes with Assist; Fluid consistency: Thin  Skin:  Reviewed, no issues  Last BM:  09/06/16  Height:   Ht Readings from Last 1 Encounters:  09/04/16 6' (1.829 m)    Weight:   Wt Readings from Last 1 Encounters:  09/04/16 263 lb (119.3 kg)    Ideal Body Weight:  80.9 kg  BMI:  Body mass index is 35.67 kg/m.  Estimated Nutritional Needs:   Kcal:  1700-1900  Protein:  85-100 grams  Fluid:  1.7-1.9 L  EDUCATION NEEDS:   Education needs addressed  Evangeline Utley A. Jimmye Norman, RD, LDN, CDE Pager: [phone] After hours Pager: [phone]

## 2016-09-07 ENCOUNTER — Inpatient Hospital Stay (HOSPITAL_COMMUNITY): Payer: Medicare Other

## 2016-09-07 ENCOUNTER — Encounter (HOSPITAL_COMMUNITY): Payer: Self-pay

## 2016-09-07 DIAGNOSIS — I951 Orthostatic hypotension: Secondary | ICD-10-CM

## 2016-09-07 DIAGNOSIS — K5641 Fecal impaction: Secondary | ICD-10-CM

## 2016-09-07 LAB — CBC
HEMATOCRIT: 28 % — AB (ref 39.0–52.0)
HEMOGLOBIN: 9.3 g/dL — AB (ref 13.0–17.0)
MCH: 29.9 pg (ref 26.0–34.0)
MCHC: 33.2 g/dL (ref 30.0–36.0)
MCV: 90 fL (ref 78.0–100.0)
Platelets: 151 10*3/uL (ref 150–400)
RBC: 3.11 MIL/uL — AB (ref 4.22–5.81)
RDW: 13.7 % (ref 11.5–15.5)
WBC: 11.2 10*3/uL — ABNORMAL HIGH (ref 4.0–10.5)

## 2016-09-07 MED ORDER — MILK AND MOLASSES ENEMA
1.0000 | Freq: Once | RECTAL | Status: DC
  Filled 2016-09-07: qty 250

## 2016-09-07 MED ORDER — SODIUM CHLORIDE 0.9 % IV SOLN
INTRAVENOUS | Status: AC
  Administered 2016-09-07: 11:00:00 via INTRAVENOUS
  Administered 2016-09-08: 1000 mL via INTRAVENOUS

## 2016-09-07 NOTE — Care Management Note (Signed)
Case Management Note  Patient Details  Name: Gene Hunter MRN: [id number] Date of Birth: 05-10-1926  Subjective/Objective:                  Closed fracture of left hip [facility]) Action/Plan: Discharge planning Expected Discharge Date:                  Expected Discharge Plan:  [place]  In-House Referral:     Discharge planning Services  CM Consult  Post Acute Care Choice:  NA Choice offered to:  Patient  DME Arranged:  N/A DME Agency:  NA  HH Arranged:  NA HH Agency:  NA  Status of Service:  Completed, signed off  If discussed at [place] of Stay Meetings, dates discussed:    Additional Comments: CM notes plan is for pt to go to SNF; CSW aware and arranging. No other CM needs were communicated. Dellie Catholic, RN 09/07/2016, 11:49 AM
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
## 2016-09-07 NOTE — Progress Notes (Signed)
Occupational Therapy Treatment Patient Details Name: Gene Hunter MRN: [id number] DOB: 1926/06/26 Today's Date: 09/07/2016    History of present illness Pt admitted from ED s/p fall with L minimally displaced greater trochanter fx - non-op at this time per ortho consult.  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   OT comments  Pt now agreeable to SNF which is best plan for pt to regain I with ADL activity   Follow Up Recommendations  SNF    Equipment Recommendations  None recommended by OT    Recommendations for Other Services      Precautions / Restrictions Precautions Precautions: Fall Restrictions Weight Bearing Restrictions: Yes LLE Weight Bearing: Partial weight bearing LLE Partial Weight Bearing Percentage or Pounds: 50%       Mobility Bed Mobility Overal bed mobility: Needs Assistance Bed Mobility: Supine to Sit     Supine to sit: Min assist     General bed mobility comments: min cues for sequence and use of R LE to self assist  Transfers Overall transfer level: Needs assistance Equipment used: Rolling walker (2 wheeled) Transfers: Sit to/from Stand Sit to Stand: Min assist Stand pivot transfers: Min assist       General transfer comment: cues for LE management and use of UEs to self assist        ADL Overall ADL's : Needs assistance/impaired                         Toilet Transfer: Minimal Insurance claims handler Details (indicate cue type and reason): sit to stand Toileting- Clothing Manipulation and Hygiene: Total assistance;Sit to/from stand;Cueing for sequencing;Cueing for compensatory techniques Toileting - Clothing Manipulation Details (indicate cue type and reason): standing for hygiene.        General ADL Comments: Pt stood EOB for several minutes for hygiene.  Pt not able to clean self. Per son pt is now agreeable to SNF as he does need as he doesnt have A at home                Cognition   Behavior  During Therapy: Flat affect Overall Cognitive Status: Within Functional Limits for tasks assessed                               General Comments      Pertinent Vitals/ Pain       Pain Score: 3  Pain Location: bottom while being cleaned up Pain Descriptors / Indicators: Sore Pain Intervention(s): Monitored during session         Frequency  Min 2X/week        Progress Toward Goals  OT Goals(current goals can now be found in the care plan section)  Progress towards OT goals: Progressing toward goals     Plan Discharge plan needs to be updated       End of Session Equipment Utilized During Treatment: Rolling walker   Activity Tolerance Patient tolerated treatment well   Patient Left with family/visitor present;with chair alarm set;in bed;with nursing/sitter in room   Nurse Communication Mobility status        Time: [id number] OT Time Calculation (min): 18 min  Charges: OT General Charges $OT Visit: 1 Procedure OT Treatments $Self Care/Home Management : 8-22 mins  Aleesia Henney D 09/07/2016, 10:40 AM

## 2016-09-07 NOTE — Progress Notes (Signed)
Physical Therapy Treatment Patient Details Name: Gene Hunter MRN: [id number] DOB: May 03, 1926 Today's Date: 09/07/2016    History of Present Illness Pt admitted from ED s/p fall with L minimally displaced greater trochanter fx - non-op at this time per ortho consult.  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  Assisted OOB to amb to bathroom.  After toilet pt was standing at sink to wash his hands when he began to "give way".  Quickly assisted back to side of bed but was able to get a standing BP of 54/26 and HR 85.  Assisted back to bed at that time RN arrived.  Follow Up Recommendations  SNF     Equipment Recommendations       Recommendations for Other Services       Precautions / Restrictions Precautions Precautions: Fall Precaution Comments: hypotensive during Tx Restrictions Weight Bearing Restrictions: Yes LLE Weight Bearing: Partial weight bearing LLE Partial Weight Bearing Percentage or Pounds: 50%    Mobility  Bed Mobility Overal bed mobility: Needs Assistance Bed Mobility: Supine to Sit;Sit to Supine     Supine to sit: Min assist;Mod assist Sit to supine: Max assist   General bed mobility comments: assist L LE off bed with increased time then MAX assist back to bed due to episode hypotension  Transfers Overall transfer level: Needs assistance Equipment used: Rolling walker (2 wheeled) Transfers: Sit to/from [facility] Sit to Stand: Min assist;Mod assist Stand pivot transfers: Min assist;Mod assist       General transfer comment: 75% cues for LE management and use of UEs to self assist as well as turn completion to toilet  Ambulation/Gait Ambulation/Gait assistance: Min assist;Mod assist Ambulation Distance (Feet): 22 Feet (11 feet x2 to and from bathroom only due to episode Hypontension ) Assistive device: Standard walker Gait Pattern/deviations: Step-to pattern;Shuffle;Trunk  flexed;Antalgic     General Gait Details: very unsteady, groggy gait to bathroom.  While standing at sink to wash hands pt began to "give way" quickly assisted back to bed but was able to get a standing BP of 54/26.  Assisted back to bed and elevated B LE's.    Stairs            Wheelchair Mobility    Modified Rankin (Stroke Patients Only)       Balance                                    Cognition Arousal/Alertness: Awake/alert Behavior During Therapy: Flat affect Overall Cognitive Status: Impaired/Different from baseline (groggy)       Memory: Decreased recall of precautions   Safety/Judgement: Decreased awareness of safety   Problem Solving: Requires verbal cues General Comments: pt required 75% VC's for direction    Exercises      General Comments        Pertinent Vitals/Pain Pain Assessment: No/denies pain Pain Score: 3  Faces Pain Scale: Hurts a little bit Pain Location: L hip Pain Descriptors / Indicators: Discomfort;Grimacing Pain Intervention(s): Monitored during session;Repositioned    Home Living                      Prior Function            PT Goals (current goals can now be found in the care plan section)  Progress towards PT goals: Progressing toward goals    Frequency    Min 3X/week      PT Plan Current plan remains appropriate    Co-evaluation             End of Session Equipment Utilized During Treatment: Gait belt Activity Tolerance:  (syncope episode Hypotensive) Patient left: in bed;with call bell/phone within reach;with family/visitor present;with nursing/sitter in room     Time: 1020-1037 PT Time Calculation (min) (ACUTE ONLY): 17 min  Charges:  $Gait Training: 8-22 mins $Therapeutic Activity: 8-22 mins                    G Codes:      Rica Koyanagi  PTA [facility]  Acute  Rehab Pager      [phone]

## 2016-09-07 NOTE — Progress Notes (Signed)
CSW met with pt / family at bedside to assist with d/c planning. Bed offers reviewed and [facility] ( [facility] ) was chosen for placement. Pt is not ready for d/c today. SNF has been notified. CSW will continue to follow to assist with d/c planning to SNF.  Werner Lean LCSW [phone]

## 2016-09-07 NOTE — Progress Notes (Signed)
Physical Therapy Treatment Patient Details Name: Gene Hunter MRN: [id number] DOB: 1926/03/11 Today's Date: 09/07/2016    History of Present Illness Pt admitted from ED s/p fall with L minimally displaced greater trochanter fx - non-op at this time per ortho consult.  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  Unsteady gait.  HIGH FALL RISK Demonstrated increased cognition/alertness.  Following all commands and able to retain new instruction.   Follow Up Recommendations  SNF     Equipment Recommendations       Recommendations for Other Services       Precautions / Restrictions Precautions Precautions: Fall Precaution Comments: hypotensive during Tx Restrictions Weight Bearing Restrictions: Yes LLE Weight Bearing: Partial weight bearing LLE Partial Weight Bearing Percentage or Pounds: 50%    Mobility  Bed Mobility Overal bed mobility: Needs Assistance Bed Mobility: Supine to Sit;Sit to Supine     Supine to sit: Min guard Sit to supine: Min assist   General bed mobility comments: assist L LE up onto bed   Overall increased ability to self perform  Transfers Overall transfer level: Needs assistance Equipment used: Rolling walker (2 wheeled) Transfers: Sit to/from [facility] Sit to Stand: Min guard;Min assist Stand pivot transfers: Min guard;Min assist       General transfer comment: 25% VC's on safety with turns using RW   Ambulation/Gait Ambulation/Gait assistance: Min guard;Min assist Ambulation Distance (Feet): 87 Feet Assistive device: Rolling walker (2 wheeled) Gait Pattern/deviations: Step-to pattern;Decreased stance time - left Gait velocity: decr   General Gait Details: tolerated increased distance but still required 25% VC's on proper walker to self distance and upright posture.  No c/o dizziness.    Stairs            Wheelchair Mobility     Modified Rankin (Stroke Patients Only)       Balance                                    Cognition Arousal/Alertness: Awake/alert Behavior During Therapy: WFL for tasks assessed/performed Overall Cognitive Status: Within Functional Limits for tasks assessed       Memory: Decreased recall of precautions   Safety/Judgement: Decreased awareness of safety   Problem Solving: Requires verbal cues General Comments: much improved cognition    Exercises      General Comments        Pertinent Vitals/Pain Pain Assessment: Faces Faces Pain Scale: Hurts little more Pain Location: L hip Pain Descriptors / Indicators: Discomfort;Grimacing Pain Intervention(s): Monitored during session;Repositioned    Home Living                      Prior Function            PT Goals (current goals can now be found in the care plan section) Progress towards PT goals: Progressing toward goals    Frequency    Min 3X/week      PT Plan Current plan remains appropriate    Co-evaluation             End of Session Equipment Utilized During Treatment: Gait belt Activity Tolerance: Patient tolerated treatment well Patient left: in bed;with bed alarm set;with call bell/phone within reach;with family/visitor present     Time:  [id number] PT Time Calculation (min) (ACUTE ONLY): 15 min  Charges:  $Gait Training: 8-22 mins $Therapeutic Activity: 8-22 mins                    G Codes:      Rica Koyanagi  PTA [facility]  Acute  Rehab Pager      [phone]

## 2016-09-07 NOTE — Progress Notes (Signed)
Patient up in bathroom with PT and reportedly started swaying. BP was 54/26. PT placed patient back in the bed with his feet raised, BP came up to 124/62 after a few minutes. Patient states just not feeling well at this time. Pushing oral fluids on patient but he's refusing at this time. Paged MD, waiting for call back at this time. Laid patient flat with HOB elevated slightly, will recheck BP in a few minutes.

## 2016-09-08 ENCOUNTER — Inpatient Hospital Stay (HOSPITAL_COMMUNITY): Payer: Medicare Other

## 2016-09-08 DIAGNOSIS — K5641 Fecal impaction: Secondary | ICD-10-CM | POA: Diagnosis not present

## 2016-09-08 DIAGNOSIS — I10 Essential (primary) hypertension: Secondary | ICD-10-CM | POA: Diagnosis not present

## 2016-09-08 DIAGNOSIS — S72002A Fracture of unspecified part of neck of left femur, initial encounter for closed fracture: Secondary | ICD-10-CM | POA: Diagnosis not present

## 2016-09-08 DIAGNOSIS — S72009A Fracture of unspecified part of neck of unspecified femur, initial encounter for closed fracture: Secondary | ICD-10-CM | POA: Diagnosis not present

## 2016-09-08 DIAGNOSIS — Y939 Activity, unspecified: Secondary | ICD-10-CM | POA: Diagnosis not present

## 2016-09-08 DIAGNOSIS — R2689 Other abnormalities of gait and mobility: Secondary | ICD-10-CM | POA: Diagnosis not present

## 2016-09-08 DIAGNOSIS — R339 Retention of urine, unspecified: Secondary | ICD-10-CM | POA: Diagnosis not present

## 2016-09-08 DIAGNOSIS — W1830XA Fall on same level, unspecified, initial encounter: Secondary | ICD-10-CM | POA: Diagnosis not present

## 2016-09-08 DIAGNOSIS — S0121XA Laceration without foreign body of nose, initial encounter: Secondary | ICD-10-CM | POA: Diagnosis not present

## 2016-09-08 DIAGNOSIS — S022XXB Fracture of nasal bones, initial encounter for open fracture: Secondary | ICD-10-CM | POA: Diagnosis not present

## 2016-09-08 DIAGNOSIS — S72115D Nondisplaced fracture of greater trochanter of left femur, subsequent encounter for closed fracture with routine healing: Secondary | ICD-10-CM | POA: Diagnosis not present

## 2016-09-08 DIAGNOSIS — W19XXXD Unspecified fall, subsequent encounter: Secondary | ICD-10-CM | POA: Diagnosis not present

## 2016-09-08 DIAGNOSIS — R278 Other lack of coordination: Secondary | ICD-10-CM | POA: Diagnosis not present

## 2016-09-08 DIAGNOSIS — K6389 Other specified diseases of intestine: Secondary | ICD-10-CM | POA: Diagnosis not present

## 2016-09-08 DIAGNOSIS — R338 Other retention of urine: Secondary | ICD-10-CM | POA: Diagnosis not present

## 2016-09-08 DIAGNOSIS — M79605 Pain in left leg: Secondary | ICD-10-CM | POA: Diagnosis not present

## 2016-09-08 DIAGNOSIS — S0181XA Laceration without foreign body of other part of head, initial encounter: Secondary | ICD-10-CM | POA: Diagnosis not present

## 2016-09-08 DIAGNOSIS — S7222XD Displaced subtrochanteric fracture of left femur, subsequent encounter for closed fracture with routine healing: Secondary | ICD-10-CM | POA: Diagnosis not present

## 2016-09-08 DIAGNOSIS — Z96643 Presence of artificial hip joint, bilateral: Secondary | ICD-10-CM | POA: Diagnosis not present

## 2016-09-08 DIAGNOSIS — S0993XA Unspecified injury of face, initial encounter: Secondary | ICD-10-CM | POA: Diagnosis present

## 2016-09-08 DIAGNOSIS — Y929 Unspecified place or not applicable: Secondary | ICD-10-CM | POA: Diagnosis not present

## 2016-09-08 DIAGNOSIS — I82402 Acute embolism and thrombosis of unspecified deep veins of left lower extremity: Secondary | ICD-10-CM | POA: Diagnosis not present

## 2016-09-08 DIAGNOSIS — Y999 Unspecified external cause status: Secondary | ICD-10-CM | POA: Diagnosis not present

## 2016-09-08 DIAGNOSIS — Z7901 Long term (current) use of anticoagulants: Secondary | ICD-10-CM | POA: Diagnosis not present

## 2016-09-08 DIAGNOSIS — M25559 Pain in unspecified hip: Secondary | ICD-10-CM | POA: Diagnosis not present

## 2016-09-08 DIAGNOSIS — N400 Enlarged prostate without lower urinary tract symptoms: Secondary | ICD-10-CM | POA: Diagnosis not present

## 2016-09-08 DIAGNOSIS — S0990XA Unspecified injury of head, initial encounter: Secondary | ICD-10-CM | POA: Diagnosis not present

## 2016-09-08 DIAGNOSIS — M7989 Other specified soft tissue disorders: Secondary | ICD-10-CM | POA: Diagnosis not present

## 2016-09-08 DIAGNOSIS — S199XXA Unspecified injury of neck, initial encounter: Secondary | ICD-10-CM | POA: Diagnosis not present

## 2016-09-08 DIAGNOSIS — I824Z2 Acute embolism and thrombosis of unspecified deep veins of left distal lower extremity: Secondary | ICD-10-CM | POA: Diagnosis not present

## 2016-09-08 DIAGNOSIS — S022XXA Fracture of nasal bones, initial encounter for closed fracture: Secondary | ICD-10-CM | POA: Diagnosis not present

## 2016-09-08 DIAGNOSIS — S72002D Fracture of unspecified part of neck of left femur, subsequent encounter for closed fracture with routine healing: Secondary | ICD-10-CM | POA: Diagnosis not present

## 2016-09-08 DIAGNOSIS — S299XXA Unspecified injury of thorax, initial encounter: Secondary | ICD-10-CM | POA: Diagnosis not present

## 2016-09-08 DIAGNOSIS — E039 Hypothyroidism, unspecified: Secondary | ICD-10-CM | POA: Diagnosis not present

## 2016-09-08 DIAGNOSIS — Z79891 Long term (current) use of opiate analgesic: Secondary | ICD-10-CM | POA: Diagnosis not present

## 2016-09-08 DIAGNOSIS — N401 Enlarged prostate with lower urinary tract symptoms: Secondary | ICD-10-CM | POA: Diagnosis not present

## 2016-09-08 DIAGNOSIS — Z96651 Presence of right artificial knee joint: Secondary | ICD-10-CM | POA: Diagnosis not present

## 2016-09-08 DIAGNOSIS — S72142A Displaced intertrochanteric fracture of left femur, initial encounter for closed fracture: Secondary | ICD-10-CM | POA: Diagnosis not present

## 2016-09-08 DIAGNOSIS — E785 Hyperlipidemia, unspecified: Secondary | ICD-10-CM | POA: Diagnosis not present

## 2016-09-08 DIAGNOSIS — R41841 Cognitive communication deficit: Secondary | ICD-10-CM | POA: Diagnosis not present

## 2016-09-08 DIAGNOSIS — Z7982 Long term (current) use of aspirin: Secondary | ICD-10-CM | POA: Diagnosis not present

## 2016-09-08 DIAGNOSIS — M6281 Muscle weakness (generalized): Secondary | ICD-10-CM | POA: Diagnosis not present

## 2016-09-08 DIAGNOSIS — Z79899 Other long term (current) drug therapy: Secondary | ICD-10-CM | POA: Diagnosis not present

## 2016-09-08 DIAGNOSIS — S7290XA Unspecified fracture of unspecified femur, initial encounter for closed fracture: Secondary | ICD-10-CM | POA: Diagnosis not present

## 2016-09-08 DIAGNOSIS — Z87891 Personal history of nicotine dependence: Secondary | ICD-10-CM | POA: Diagnosis not present

## 2016-09-08 MED ORDER — ACETAMINOPHEN 325 MG PO TABS: 650.0000 mg | ORAL_TABLET | Freq: Four times a day (QID) | ORAL | Status: DC | PRN

## 2016-09-08 MED ORDER — POLYETHYLENE GLYCOL 3350 17 G PO PACK: 17.0000 g | PACK | Freq: Every day | ORAL | Status: DC

## 2016-09-08 MED ORDER — SENNOSIDES-DOCUSATE SODIUM 8.6-50 MG PO TABS: 1.0000 | ORAL_TABLET | Freq: Two times a day (BID) | ORAL | Status: DC
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
## 2016-09-08 NOTE — Discharge Instructions (Signed)
Fecal Impaction  A fecal impaction is a large, firm amount of stool (feces) that will not pass out of the body. A fecal impaction usually occurs in the end of the large intestine (rectum). It can block the large intestine and cause significant problems.  What are the causes?  This condition may be caused by anything that slows down bowel movements, including:  · Long-term use of medicines that help you have a bowel movement (laxatives).  · Constipation.  · Pain in the rectum. Fecal impaction can occur if you avoid having bowel movements due to the pain. Pain in the rectum can result from a medical condition, such as hemorrhoids or anal fissures.  · Narcotic pain-relieving medicines, such as methadone, morphine, or codeine.  · Not drinking enough fluids.  · Being inactive for a long period of time.  · Diseases of the brain or nervous system that damage nerves that control the muscles of the intestines.    What are the signs or symptoms?  Symptoms of this condition include:  · Breathing problems.  · Nausea, vomiting, and dehydration.  · Dizziness.  · Confusion.  · Rapid heartbeat.  · Fever.  · Sweating.  · Changes in blood pressure.  · Not having a normal number of bowel movements.  · Changes in bowel patterns. This may include going to the bathroom less often or not at all.  · A sense of fullness in the rectum but being unable to pass stool.  · Pain or cramps in the abdominal area. These often happen after meals.  · Thin, watery discharge from the rectum.    How is this diagnosed?  This condition may be diagnosed based on your symptoms and an exam of your rectum. Sometimes X-rays or lab tests are done to confirm the diagnosis and to check for other problems.  How is this treated?  This condition may be treated by:  · Having your health care provider remove the stool using a gloved finger.  · Taking medicine.  · A suppository or enema given in the rectum to soften the stool, which can stimulate a bowel  movement.    Follow these instructions at home:  Eating and drinking   · Drink enough fluid to keep your urine clear or pale yellow.  · Include a lot of fiber in your diet. Foods with a lot of fiber include fruits, vegetables, and oatmeal.  · If you begin to get constipated, increase the amount of fiber in your diet.  General instructions   · Develop bowel habits. An example of a bowel habit is having a bowel movement right after breakfast every day. Be sure to give yourself enough time on the toilet. This may require using enemas, bowel softeners, or suppositories at home, as directed by your health care provider. It may also include using mineral oil or olive oil.  · Exercise regularly.  · Take over-the-counter and prescription medicines only as told by your health care provider.  Contact a health care provider if:  · You have ongoing pain in your rectum.  · You need to use an enema or a suppository more than 2 times a week.  · You have rectal bleeding.  · You continue to have problems. The problems may include not being able to go to the bathroom and long-term (chronic) constipation.  · You have pain in your abdomen.  · You have thin, pencil-like stools.  Get help right away if:  · You have black 

## 2016-09-08 NOTE — Care Management Important Message (Signed)
Important Message  Patient Details  Name: Gene Hunter MRN: [id number] Date of Birth: 04-04-1926   Medicare Important Message Given:  Yes    Kerin Salen 09/08/2016, 10:11 AMImportant Message  Patient Details  Name: Gene Hunter MRN: [id number] Date of Birth: [birth year]   Medicare Important Message Given:  Yes    Kerin Salen 09/08/2016, 10:11 AM

## 2016-09-08 NOTE — Progress Notes (Signed)
Report called to [facility] to [facility].

## 2016-09-08 NOTE — Clinical Social Work Note (Signed)
Medical Social Worker facilitated patient discharge including contacting patient family and facility to confirm patient discharge plans.  Clinical information faxed to facility and family agreeable with plan.  MSW arranged ambulance transport via [facility] to [facility].  RN to call report prior to discharge.  Medical Social Worker will sign off for now as social work intervention is no longer needed. Please consult [place] again if new need arises.  Glendon Axe, MSW [phone] 09/08/2016 1:58 PM

## 2016-09-09 ENCOUNTER — Emergency Department (HOSPITAL_COMMUNITY)
Admission: EM | Admit: 2016-09-09 | Discharge: 2016-09-09 | Disposition: A | Discharge status: Home / Self Care | Payer: Medicare Other | Attending: Emergency Medicine | Admitting: Emergency Medicine

## 2016-09-09 ENCOUNTER — Emergency Department (HOSPITAL_COMMUNITY): Payer: Medicare Other

## 2016-09-09 ENCOUNTER — Encounter (HOSPITAL_COMMUNITY): Payer: Self-pay

## 2016-09-09 DIAGNOSIS — S022XXA Fracture of nasal bones, initial encounter for closed fracture: Secondary | ICD-10-CM | POA: Diagnosis not present

## 2016-09-09 DIAGNOSIS — I10 Essential (primary) hypertension: Secondary | ICD-10-CM | POA: Insufficient documentation

## 2016-09-09 DIAGNOSIS — Z87891 Personal history of nicotine dependence: Secondary | ICD-10-CM | POA: Insufficient documentation

## 2016-09-09 DIAGNOSIS — N400 Enlarged prostate without lower urinary tract symptoms: Secondary | ICD-10-CM | POA: Diagnosis not present

## 2016-09-09 DIAGNOSIS — S0121XA Laceration without foreign body of nose, initial encounter: Secondary | ICD-10-CM | POA: Diagnosis not present

## 2016-09-09 DIAGNOSIS — S199XXA Unspecified injury of neck, initial encounter: Secondary | ICD-10-CM | POA: Diagnosis not present

## 2016-09-09 DIAGNOSIS — R339 Retention of urine, unspecified: Secondary | ICD-10-CM | POA: Diagnosis not present

## 2016-09-09 DIAGNOSIS — Z7982 Long term (current) use of aspirin: Secondary | ICD-10-CM | POA: Diagnosis not present

## 2016-09-09 DIAGNOSIS — K5641 Fecal impaction: Secondary | ICD-10-CM | POA: Diagnosis not present

## 2016-09-09 DIAGNOSIS — W1830XA Fall on same level, unspecified, initial encounter: Secondary | ICD-10-CM | POA: Insufficient documentation

## 2016-09-09 DIAGNOSIS — E039 Hypothyroidism, unspecified: Secondary | ICD-10-CM | POA: Insufficient documentation

## 2016-09-09 DIAGNOSIS — S7290XA Unspecified fracture of unspecified femur, initial encounter for closed fracture: Secondary | ICD-10-CM | POA: Diagnosis not present

## 2016-09-09 DIAGNOSIS — S0181XA Laceration without foreign body of other part of head, initial encounter: Secondary | ICD-10-CM | POA: Diagnosis not present

## 2016-09-09 DIAGNOSIS — Y929 Unspecified place or not applicable: Secondary | ICD-10-CM | POA: Insufficient documentation

## 2016-09-09 DIAGNOSIS — S72142A Displaced intertrochanteric fracture of left femur, initial encounter for closed fracture: Secondary | ICD-10-CM | POA: Diagnosis not present

## 2016-09-09 DIAGNOSIS — Z96651 Presence of right artificial knee joint: Secondary | ICD-10-CM | POA: Insufficient documentation

## 2016-09-09 DIAGNOSIS — S299XXA Unspecified injury of thorax, initial encounter: Secondary | ICD-10-CM | POA: Diagnosis not present

## 2016-09-09 DIAGNOSIS — Y939 Activity, unspecified: Secondary | ICD-10-CM | POA: Insufficient documentation

## 2016-09-09 DIAGNOSIS — E785 Hyperlipidemia, unspecified: Secondary | ICD-10-CM | POA: Diagnosis not present

## 2016-09-09 DIAGNOSIS — S0990XA Unspecified injury of head, initial encounter: Secondary | ICD-10-CM | POA: Diagnosis not present

## 2016-09-09 DIAGNOSIS — Z79899 Other long term (current) drug therapy: Secondary | ICD-10-CM | POA: Insufficient documentation

## 2016-09-09 DIAGNOSIS — S022XXB Fracture of nasal bones, initial encounter for open fracture: Secondary | ICD-10-CM | POA: Diagnosis not present

## 2016-09-09 DIAGNOSIS — Y999 Unspecified external cause status: Secondary | ICD-10-CM | POA: Insufficient documentation

## 2016-09-09 MED ORDER — LIDOCAINE-EPINEPHRINE 1 %-1:100000 IJ SOLN
10.0000 mL | Freq: Once | INTRAMUSCULAR | Status: AC
  Administered 2016-09-09: 10 mL
  Filled 2016-09-09: qty 10

## 2016-09-09 MED ORDER — CEPHALEXIN 500 MG PO CAPS: 500.0000 mg | ORAL_CAPSULE | Freq: Three times a day (TID) | ORAL | 0 refills | Status: DC

## 2016-09-09 MED ORDER — ALBUTEROL (5 MG/ML) CONTINUOUS INHALATION SOLN
10.0000 mg/h | INHALATION_SOLUTION | Freq: Once | RESPIRATORY_TRACT | Status: DC
  Filled 2016-09-09: qty 20

## 2016-09-09 MED ORDER — CEPHALEXIN 500 MG PO CAPS: 500.0000 mg | ORAL_CAPSULE | Freq: Three times a day (TID) | ORAL | 0 refills | Status: AC

## 2016-09-09 NOTE — Progress Notes (Signed)
This pt was assisted to [place] on 09/07/16

## 2016-09-09 NOTE — Progress Notes (Addendum)
   09/09/16 0000  CM Assessment  In-house Referral Clinical Social Work  Discharge Planning Services CM Consult  [facility] Choice Home Health  Choice offered to / list presented to  Patient;Adult Children  Status of Service In process, will continue to follow    ED CM consulted by ED SW, Maudie Mercury after her assessment with pt, son and male visitor CM reviewed EPIC to see pt recent d/c to snf from [facility] on 09/07/16 Pt with falls with laceration of head for this ED encounter CM reviewed in details medicare guidelines, Choices of home health [facility]) (length of stay in home, types of [facility] staff available, coverage, primary caregiver, up to 24 hrs before services may be started) and choices of Private duty nursing (PDN-coverage, length of stay in the home types of staff available). CM reviewed availability of [place] SW to assist pcp to get community resources CM provided pt/son/male visitor with a list of [place] home health agencies, choice connections pamphlet and PDN.   Discussed level of care to include 24 hr supervision and facilities, AMA from a snf, snf being able to assist with home health services if pt not AMA. Discussed homebound home health status, pcp assist with home health, insurance coverage of home health and having a "teachable caregiver" as a home health requirement.  Pt alert, oriented to person and time but not situation Aware he has fallen and when asked what he wanted he responded "to get well", "right now my head is so messed up I'm confused" when asked if he wanted to go back to snf or go home  Son states he and his sister are POA and he wants to speak with her prior to making a decision on home health services choice.  Reports he will make contact with the sister possibly around "twelve o'clock" The male visitor recommended to pt and son that the pt return to [facility] snf and allow them to continue to work with pt and assist with getting pt home when ready. CM updated ED RN and ED SW

## 2016-09-09 NOTE — ED Notes (Signed)
Social work at bedside.  

## 2016-09-09 NOTE — ED Notes (Signed)
Right arm skin tear cleaned and dressed.

## 2016-09-09 NOTE — ED Notes (Signed)
Patient cleared from [place] via EDP

## 2016-09-09 NOTE — ED Notes (Signed)
[facility] called for transport.  

## 2016-09-09 NOTE — ED Notes (Signed)
Bed: WA04 Expected date:  Expected time:  Means of arrival:  Comments: EMS 83 yo male from SNF-fall/3 inch lac left forehead-blood from nose-numbness and tingling all ove-MAE x 4-recent left hip replacement

## 2016-09-09 NOTE — ED Notes (Signed)
Report called to Caren Griffins, RN at [facility].

## 2016-09-09 NOTE — Progress Notes (Signed)
CSW spoke with patient at bedside, family present. Patient reports that he had a fall this morning and has been falling often recently. Patient from SNF. Patient's son reported that he feels better with patient returning home and receiving care within the home versus patient returning to SNF. CSW informed patient's son that care management takes care of in home health needs and agreed to notify patient's EDP that the patient/family were interested in home health services. CSW informed patient's EDP about care management consult request. CSW contacted ED RN CM and received no answer, left voicemail informing CM about patient's request/needs.

## 2016-09-09 NOTE — ED Notes (Signed)
Case manager at bedside 

## 2016-09-09 NOTE — ED Notes (Signed)
Pt still in radiology, RN Colletta Maryland will draw labs when patient returns

## 2016-09-09 NOTE — Discharge Instructions (Signed)
Keflex 3 times per day 7 days.  Suture removal in 7-10 days.

## 2016-09-09 NOTE — ED Triage Notes (Addendum)
Fall from standing position, injury to face, right forearm, and pain to left hip. Laceration noted to left inner brow and bridge of nose. Patient arrived via [facility] on [facility]. Recent history of left femur FX

## 2016-09-09 NOTE — ED Notes (Signed)
Patient d/c'd to facility.  F/U reviewed.  Patient and family and facility verbalized undertanding

## 2016-09-09 NOTE — ED Notes (Signed)
MD at bedside. 

## 2016-09-09 NOTE — ED Notes (Signed)
Patient and family requesting social work consult.  Notified MD.

## 2016-09-09 NOTE — ED Provider Notes (Addendum)
[facility] DEPT Provider Note   CSN: [id number] Arrival date & time: 09/09/16  4270     History   Chief Complaint Chief Complaint  Patient presents with  . Fall    HPI Gene Hunter is a 83 y.o. male. He was just discharged from this hospital yesterday to a skilled nursing facility. He had a fall this morning. When the laceration over his forehead.  States he is still painful over his left hip but does not feel as though he injured it today. States he hurts "all over"  HPI  Past Medical History:  Diagnosis Date  . Blindness of right eye   . BPH (benign prostatic hyperplasia)   . Essential hypertension   . HLD (hyperlipidemia)   . Hypothyroidism     Patient Active Problem List   Diagnosis Date Noted  . Closed fracture of left hip ([place]) 09/04/2016  . Constipation 09/04/2016  . Fall 09/04/2016  . BPH (benign prostatic hyperplasia) 09/04/2016  . Essential hypertension   . HLD (hyperlipidemia)   . Hypothyroidism     Past Surgical History:  Procedure Laterality Date  . bilateral hip replacement    . HERNIA REPAIR    . JOINT REPLACEMENT    . right knee replacement         Home Medications    Prior to Admission medications   Medication Sig Start Date End Date Taking? Authorizing Provider  acetaminophen (TYLENOL) 325 MG tablet Take 2 tablets (650 mg total) by mouth every 6 (six) hours as needed for mild pain, moderate pain, fever or headache. 09/08/16   Modena Jansky, MD  alfuzosin (UROXATRAL) 10 MG 24 hr tablet Take 10 mg by mouth daily. 08/30/16   Historical Provider, MD  aspirin (ASPIRIN EC) 81 MG EC tablet Take 81 mg by mouth daily. Swallow whole.     Historical Provider, MD  atorvastatin (LIPITOR) 20 MG tablet Take 20 mg by mouth daily.    Historical Provider, MD  cephALEXin (KEFLEX) 500 MG capsule Take 1 capsule (500 mg total) by mouth 3 (three) times daily. 09/09/16 09/16/16  Tanna Furry, MD  levothyroxine (LEVOXYL) 100 MCG tablet Take 100 mcg by mouth  daily.    Historical Provider, MD  Multiple Vitamins-Minerals (MULTIVITAMIN WITH MINERALS) tablet Take 1 tablet by mouth daily.    Historical Provider, MD  polyethylene glycol (MIRALAX / GLYCOLAX) packet Take 17 g by mouth daily. 09/09/16   Modena Jansky, MD  senna-docusate (SENOKOT-S) 8.6-50 MG tablet Take 1 tablet by mouth 2 (two) times daily. 09/08/16   Modena Jansky, MD  zolpidem (AMBIEN) 5 MG tablet Take 5 mg by mouth at bedtime as needed. sleep    Historical Provider, MD    Family History Family History  Problem Relation Age of Onset  . Heart disease Father     Social History Social History  Substance Use Topics  . Smoking status: Former Research scientist (life sciences)  . Smokeless tobacco: Never Used  . Alcohol use No     Allergies   Milk-related compounds   Review of Systems Review of Systems  Constitutional: Negative for appetite change, chills, diaphoresis, fatigue and fever.  HENT: Negative for mouth sores, sore throat and trouble swallowing.        Forehead laceration  Eyes: Negative for visual disturbance.  Respiratory: Negative for cough, chest tightness, shortness of breath and wheezing.   Cardiovascular: Negative for chest pain.  Gastrointestinal: Negative for abdominal distention, abdominal pain, diarrhea, nausea and vomiting.  Endocrine:  Negative for polydipsia, polyphagia and polyuria.  Genitourinary: Negative for dysuria, frequency and hematuria.  Musculoskeletal: Positive for arthralgias. Negative for gait problem.  Skin: Negative for color change, pallor and rash.  Neurological: Positive for headaches. Negative for dizziness, syncope and light-headedness.  Hematological: Does not bruise/bleed easily.  Psychiatric/Behavioral: Negative for behavioral problems and confusion.     Physical Exam Updated Vital Signs BP 154/68 (BP Location: Left Arm)   Pulse 100   Temp 97.8 F (36.6 C) (Oral)   Resp 14   SpO2 97%   Physical Exam  Constitutional: He is oriented to  person, place, and time. He appears well-developed and well-nourished. No distress.  HENT:  Head: Normocephalic.  Pupils equal round and reactive. No blood over the TMs, mastoids, or from ears nose or mouth.  4cm  laceration just superior to the left eye brow. The laceration over nasal bridge. No nasal deformity  Eyes: Conjunctivae are normal. Pupils are equal, round, and reactive to light. No scleral icterus.  Neck: Normal range of motion. Neck supple. No thyromegaly present.  Cardiovascular: Normal rate and regular rhythm.  Exam reveals no gallop and no friction rub.   No murmur heard. Pulmonary/Chest: Effort normal and breath sounds normal. No respiratory distress. He has no wheezes. He has no rales.  Abdominal: Soft. Bowel sounds are normal. He exhibits no distension. There is no tenderness. There is no rebound.  Musculoskeletal: Normal range of motion.  Incisions with left lateral hip and extensive evolving ecchymosis to the medial left upper leg at the groin. Dressing covers a sacral decubitus.  Neurological: He is alert and oriented to person, place, and time.  Awake. Oriented 3. Moves all 4 extremities without weakness or deficit. Cranial nerves intact symmetric.  Skin: Skin is warm and dry. No rash noted.  Psychiatric: He has a normal mood and affect. His behavior is normal.     ED Treatments / Results  Labs (all labs ordered are listed, but only abnormal results are displayed) Labs Reviewed - No data to display  EKG  EKG Interpretation None       Radiology Ct Head Wo Contrast  Result Date: 09/09/2016 CLINICAL DATA:  Fall EXAM: CT HEAD WITHOUT CONTRAST CT MAXILLOFACIAL WITHOUT CONTRAST CT CERVICAL SPINE WITHOUT CONTRAST TECHNIQUE: Multidetector CT imaging of the head, cervical spine, and maxillofacial structures were performed using the standard protocol without intravenous contrast. Multiplanar CT image reconstructions of the cervical spine and maxillofacial structures  were also generated. COMPARISON:  CT head 09/09/2011 FINDINGS: CT HEAD FINDINGS Brain: Mild atrophy and mild chronic microvascular ischemia. Negative for acute infarct. Negative for acute hemorrhage or mass. Vascular: No hyperdense vessel or unexpected calcification. Skull: Negative for skull fracture. Nasal bone fracture. Soft tissue swelling and laceration four left forehead Other: None CT MAXILLOFACIAL FINDINGS Osseous: Comminuted and mildly displaced fracture of the nasal bone. No other fractures in the face. No orbital or mandible fracture. Orbits: Negative for orbital mass or edema. Bilateral cataract extraction. Scleral buckle on the right. Sinuses: Mild mucosal edema in the paranasal sinuses. No air-fluid level. Soft tissues: Soft tissue swelling and laceration left forehead. CT CERVICAL SPINE FINDINGS Alignment: 3 mm retrolisthesis C4-5.  Mild anterolisthesis C2-3 Skull base and vertebrae: Negative for fracture. Large hemangioma T3 vertebral body. Soft tissues and spinal canal: Carotid calcification. Negative for mass. Disc levels: Disc degeneration and spondylosis throughout the cervical spine. Mild to moderate facet degeneration bilaterally right greater than left. Spinal stenosis at C3-4 and C4-5 with associated foraminal  encroachment due to spurring. Milder stenosis at C5-6 and C6-7. Upper chest: Negative Other: None IMPRESSION: No acute intracranial abnormality Comminuted nasal bone fracture.  No other facial fracture Cervical spondylosis.  Negative for cervical spine fracture. Electronically Signed   By: Franchot Gallo M.D.   On: 09/09/2016 07:52   Ct Cervical Spine Wo Contrast  Result Date: 09/09/2016 CLINICAL DATA:  Fall EXAM: CT HEAD WITHOUT CONTRAST CT MAXILLOFACIAL WITHOUT CONTRAST CT CERVICAL SPINE WITHOUT CONTRAST TECHNIQUE: Multidetector CT imaging of the head, cervical spine, and maxillofacial structures were performed using the standard protocol without intravenous contrast. Multiplanar  CT image reconstructions of the cervical spine and maxillofacial structures were also generated. COMPARISON:  CT head 09/09/2011 FINDINGS: CT HEAD FINDINGS Brain: Mild atrophy and mild chronic microvascular ischemia. Negative for acute infarct. Negative for acute hemorrhage or mass. Vascular: No hyperdense vessel or unexpected calcification. Skull: Negative for skull fracture. Nasal bone fracture. Soft tissue swelling and laceration four left forehead Other: None CT MAXILLOFACIAL FINDINGS Osseous: Comminuted and mildly displaced fracture of the nasal bone. No other fractures in the face. No orbital or mandible fracture. Orbits: Negative for orbital mass or edema. Bilateral cataract extraction. Scleral buckle on the right. Sinuses: Mild mucosal edema in the paranasal sinuses. No air-fluid level. Soft tissues: Soft tissue swelling and laceration left forehead. CT CERVICAL SPINE FINDINGS Alignment: 3 mm retrolisthesis C4-5.  Mild anterolisthesis C2-3 Skull base and vertebrae: Negative for fracture. Large hemangioma T3 vertebral body. Soft tissues and spinal canal: Carotid calcification. Negative for mass. Disc levels: Disc degeneration and spondylosis throughout the cervical spine. Mild to moderate facet degeneration bilaterally right greater than left. Spinal stenosis at C3-4 and C4-5 with associated foraminal encroachment due to spurring. Milder stenosis at C5-6 and C6-7. Upper chest: Negative Other: None IMPRESSION: No acute intracranial abnormality Comminuted nasal bone fracture.  No other facial fracture Cervical spondylosis.  Negative for cervical spine fracture. Electronically Signed   By: Franchot Gallo M.D.   On: 09/09/2016 07:52   Dg Chest Port 1 View  Result Date: 09/09/2016 CLINICAL DATA:  As post fall this morning, known left hip fracture. EXAM: PORTABLE CHEST 1 VIEW COMPARISON:  Portable chest x-ray of September 04, 2016 FINDINGS: The lungs are mildly hypoinflated. There is no focal infiltrate. There is  no pleural effusion or pneumothorax. The heart and pulmonary vascularity are normal. There is calcification in the wall of the aortic arch. The trachea is midline. The observed bony thorax exhibits no acute abnormality. IMPRESSION: There is no acute cardiopulmonary abnormality. Thoracic aortic atherosclerosis. Electronically Signed   By: David  Martinique M.D.   On: 09/09/2016 07:47   Dg Abd Portable 1v  Result Date: 09/08/2016 CLINICAL DATA:  Evaluate for possible fecal impaction EXAM: PORTABLE ABDOMEN - 1 VIEW COMPARISON:  09/07/2016 FINDINGS: Scattered large and small bowel gas is noted. Fecal material is noted throughout the colon similar to that seen on the prior exam. The degree of fecal material within the rectum has decreased in the interval from the prior exam. Degenerative changes of the lumbar spine and bilateral hip replacements are again noted. IMPRESSION: Scattered fecal material throughout the colon. Decreased rectal fecal material. Electronically Signed   By: Inez Catalina M.D.   On: 09/08/2016 07:31   Dg Abd Portable 1v  Result Date: 09/07/2016 CLINICAL DATA:  Evaluate constipation.  Pain all over EXAM: PORTABLE ABDOMEN - 1 VIEW COMPARISON:  09/06/2016 FINDINGS: Moderate volume stool rectum. Minimal stool in the transverse and descending colon. Mild decrease  in volume compared to prior. No dilated loops of large or small bowel. No organomegaly. No pathologic calcification. Bilateral hip prosthetics noted. IMPRESSION: Moderate volume stool rectum. Normal volume stool remaining colon. Mild decrease in stool burden compared to prior. Electronically Signed   By: Suzy Bouchard M.D.   On: 09/07/2016 09:59   Dg Hip Unilat W Or Wo Pelvis 2-3 Views Left  Result Date: 09/09/2016 CLINICAL DATA:  Status post fall this morning. Left hip pain. Known acute close comminuted fracture of the intertrochanteric-sub trochanteric left femur diagnosed on September 04, 2016. EXAM: DG HIP (WITH OR WITHOUT PELVIS)  2-3V LEFT COMPARISON:  CT scan of the left hip of September 04, 2016 FINDINGS: The fracture of the proximal left femur is again demonstrated. The there is increased distraction of the greater trochanteric fracture fragment as compared to the previous CT scan. The left hip joint prosthesis remains in position. The observed portions of the left hemipelvis appear normal. IMPRESSION: Known fracture of the intertrochanteric -sub trochanteric region of the left hip surrounding the femoral stem of the prosthesis. There appears to be slightly more distraction of the greater trochanteric fracture fragment on today's study. Electronically Signed   By: David  Martinique M.D.   On: 09/09/2016 07:34   Ct Maxillofacial Wo Contrast  Result Date: 09/09/2016 CLINICAL DATA:  Fall EXAM: CT HEAD WITHOUT CONTRAST CT MAXILLOFACIAL WITHOUT CONTRAST CT CERVICAL SPINE WITHOUT CONTRAST TECHNIQUE: Multidetector CT imaging of the head, cervical spine, and maxillofacial structures were performed using the standard protocol without intravenous contrast. Multiplanar CT image reconstructions of the cervical spine and maxillofacial structures were also generated. COMPARISON:  CT head 09/09/2011 FINDINGS: CT HEAD FINDINGS Brain: Mild atrophy and mild chronic microvascular ischemia. Negative for acute infarct. Negative for acute hemorrhage or mass. Vascular: No hyperdense vessel or unexpected calcification. Skull: Negative for skull fracture. Nasal bone fracture. Soft tissue swelling and laceration four left forehead Other: None CT MAXILLOFACIAL FINDINGS Osseous: Comminuted and mildly displaced fracture of the nasal bone. No other fractures in the face. No orbital or mandible fracture. Orbits: Negative for orbital mass or edema. Bilateral cataract extraction. Scleral buckle on the right. Sinuses: Mild mucosal edema in the paranasal sinuses. No air-fluid level. Soft tissues: Soft tissue swelling and laceration left forehead. CT CERVICAL SPINE FINDINGS  Alignment: 3 mm retrolisthesis C4-5.  Mild anterolisthesis C2-3 Skull base and vertebrae: Negative for fracture. Large hemangioma T3 vertebral body. Soft tissues and spinal canal: Carotid calcification. Negative for mass. Disc levels: Disc degeneration and spondylosis throughout the cervical spine. Mild to moderate facet degeneration bilaterally right greater than left. Spinal stenosis at C3-4 and C4-5 with associated foraminal encroachment due to spurring. Milder stenosis at C5-6 and C6-7. Upper chest: Negative Other: None IMPRESSION: No acute intracranial abnormality Comminuted nasal bone fracture.  No other facial fracture Cervical spondylosis.  Negative for cervical spine fracture. Electronically Signed   By: Franchot Gallo M.D.   On: 09/09/2016 07:52    Procedures Procedures (including critical care time)  Medications Ordered in ED Medications  albuterol (PROVENTIL,VENTOLIN) solution continuous neb (not administered)  lidocaine-EPINEPHrine (XYLOCAINE W/EPI) 1 %-1:100000 (with pres) injection 10 mL (not administered)     Initial Impression / Assessment and Plan / ED Course  I have reviewed the triage vital signs and the nursing notes.  Pertinent labs & imaging results that were available during my care of the patient were reviewed by me and considered in my medical decision making (see chart for details).  We'll reimage his hip. Plan CT of head, maxillofacial, neck. Laceration repair. Reevaluation. EKG shows sinus rhythm no ectopy.  LACERATION REPAIR Performed by: Lolita Patella Authorized by: Lolita Patella Consent: Verbal consent obtained. Risks and benefits: risks, benefits and alternatives were discussed Consent given by: patient Patient identity confirmed: provided demographic data Prepped and Draped in normal sterile fashion Wound explored  Laceration Location: Nasal bridge  Laceration Length: 2cm  No Foreign Bodies seen or palpated  Anesthesia: local  infiltration  Local anesthetic: lidocaine 1 % with epinephrine  Anesthetic total: 4 ml  Irrigation method: syringe Amount of cleaning: standard  Skin closure: 4-0 nylon   Number of sutures: 1   Technique: Interrupted   Patient tolerance: Patient tolerated the procedure well with no immediate complications.  LACERATION REPAIR Performed by: Lolita Patella Authorized by: Lolita Patella Consent: Verbal consent obtained. Risks and benefits: risks, benefits and alternatives were discussed Consent given by: patient Patient identity confirmed: provided demographic data Prepped and Draped in normal sterile fashion Wound explored  Laceration Location: Left forehead  Laceration Length: Four cm  No Foreign Bodies seen or palpated  Anesthesia: local infiltration  Local anesthetic: lidocaine 1 % with epinephrine  Anesthetic total: 3 ml  Irrigation method: syringe Amount of cleaning: standard  Skin closure: 4-0 nylon   Number of sutures: 6   Technique: simple interrupted  Patient tolerance: Patient tolerated the procedure well with no immediate complications.    Final Clinical Impressions(s) / ED Diagnoses   Final diagnoses:  Facial laceration, initial encounter  Open fracture of nasal bone, initial encounter   CT of head without acute abnormalities. No abnormalities of the cervical spine. Nasal fracture which is open by the nature of the laceration above. Lacerations were repaired. The nasal fracture was copiously irrigated. Will place on Keflex.   New Prescriptions New Prescriptions   CEPHALEXIN (KEFLEX) 500 MG CAPSULE    Take 1 capsule (500 mg total) by mouth 3 (three) times daily.     Family had originally thought about moving into a new facility. Ultimately they decided they wanted to have him at home. Her case manager has met with them. Patient will return to his facility today and over the next 24-48 hours the family hopes to ready home for him to  come home and have inpatient home rehabilitation. This will be made possible with interventions at the social workers at the patient's facility. Our care manager has spoken with them. He is appropriate for discharge back to his skilled nursing facility for continued rehabilitation   Tanna Furry, MD 09/09/16 [id number]    Tanna Furry, MD 09/09/16 [phone]

## 2016-09-09 NOTE — Progress Notes (Signed)
   09/09/16 0000  CM Assessment  Expected Discharge Plan Skilled Nursing Facility  In-house Referral Clinical Social Work  Discharge Planning Services CM Consult  [facility] Home Health  Choice offered to / list presented to  Patient;Adult Children  DME Arranged N/A  DME Agency NA  HH Arranged NA  [facility] Agency NA  Status of Service Completed, signed off  Discharge Disposition [facility]    ED CM updated EDP, Jeneen Rinks.   Dr Jeneen Rinks states ED RN states pt/son has decided to return to [facility] and allow [facility] staff to assist with home health services at time he is d/c from [facility]

## 2016-09-21 DIAGNOSIS — S7222XD Displaced subtrochanteric fracture of left femur, subsequent encounter for closed fracture with routine healing: Secondary | ICD-10-CM | POA: Diagnosis not present

## 2016-09-21 DIAGNOSIS — S72115D Nondisplaced fracture of greater trochanter of left femur, subsequent encounter for closed fracture with routine healing: Secondary | ICD-10-CM | POA: Diagnosis not present

## 2016-10-05 ENCOUNTER — Ambulatory Visit (HOSPITAL_BASED_OUTPATIENT_CLINIC_OR_DEPARTMENT_OTHER)
Admission: RE | Admit: 2016-10-05 | Discharge: 2016-10-05 | Disposition: A | Discharge status: Home / Self Care | Payer: Medicare Other | Source: Ambulatory Visit | Attending: Internal Medicine | Admitting: Internal Medicine

## 2016-10-05 ENCOUNTER — Encounter (HOSPITAL_COMMUNITY): Payer: Self-pay | Admitting: Emergency Medicine

## 2016-10-05 ENCOUNTER — Emergency Department (HOSPITAL_COMMUNITY)
Admission: EM | Admit: 2016-10-05 | Discharge: 2016-10-05 | Disposition: A | Discharge status: Home / Self Care | Payer: Medicare Other | Attending: Physician Assistant | Admitting: Physician Assistant

## 2016-10-05 ENCOUNTER — Other Ambulatory Visit (HOSPITAL_COMMUNITY): Payer: Self-pay | Admitting: Orthopedic Surgery

## 2016-10-05 DIAGNOSIS — E039 Hypothyroidism, unspecified: Secondary | ICD-10-CM | POA: Insufficient documentation

## 2016-10-05 DIAGNOSIS — Z96651 Presence of right artificial knee joint: Secondary | ICD-10-CM | POA: Insufficient documentation

## 2016-10-05 DIAGNOSIS — M7989 Other specified soft tissue disorders: Secondary | ICD-10-CM

## 2016-10-05 DIAGNOSIS — Z96643 Presence of artificial hip joint, bilateral: Secondary | ICD-10-CM | POA: Insufficient documentation

## 2016-10-05 DIAGNOSIS — S7290XA Unspecified fracture of unspecified femur, initial encounter for closed fracture: Secondary | ICD-10-CM | POA: Diagnosis not present

## 2016-10-05 DIAGNOSIS — I82402 Acute embolism and thrombosis of unspecified deep veins of left lower extremity: Secondary | ICD-10-CM | POA: Diagnosis not present

## 2016-10-05 DIAGNOSIS — S72115D Nondisplaced fracture of greater trochanter of left femur, subsequent encounter for closed fracture with routine healing: Secondary | ICD-10-CM | POA: Diagnosis not present

## 2016-10-05 DIAGNOSIS — I824Z2 Acute embolism and thrombosis of unspecified deep veins of left distal lower extremity: Secondary | ICD-10-CM | POA: Insufficient documentation

## 2016-10-05 DIAGNOSIS — M79605 Pain in left leg: Secondary | ICD-10-CM

## 2016-10-05 DIAGNOSIS — Z7901 Long term (current) use of anticoagulants: Secondary | ICD-10-CM | POA: Diagnosis not present

## 2016-10-05 DIAGNOSIS — Z79891 Long term (current) use of opiate analgesic: Secondary | ICD-10-CM | POA: Insufficient documentation

## 2016-10-05 DIAGNOSIS — I10 Essential (primary) hypertension: Secondary | ICD-10-CM | POA: Insufficient documentation

## 2016-10-05 DIAGNOSIS — S7222XD Displaced subtrochanteric fracture of left femur, subsequent encounter for closed fracture with routine healing: Secondary | ICD-10-CM | POA: Diagnosis not present

## 2016-10-05 LAB — CBC WITH DIFFERENTIAL/PLATELET
BASOS ABS: 0 10*3/uL (ref 0.0–0.1)
Basophils Relative: 0 %
EOS ABS: 0.2 10*3/uL (ref 0.0–0.7)
EOS PCT: 3 %
HCT: 35.1 % — ABNORMAL LOW (ref 39.0–52.0)
Hemoglobin: 11.1 g/dL — ABNORMAL LOW (ref 13.0–17.0)
Lymphocytes Relative: 21 %
Lymphs Abs: 1.4 10*3/uL (ref 0.7–4.0)
MCH: 29.3 pg (ref 26.0–34.0)
MCHC: 31.6 g/dL (ref 30.0–36.0)
MCV: 92.6 fL (ref 78.0–100.0)
MONO ABS: 0.6 10*3/uL (ref 0.1–1.0)
Monocytes Relative: 9 %
Neutro Abs: 4.6 10*3/uL (ref 1.7–7.7)
Neutrophils Relative %: 67 %
PLATELETS: 288 10*3/uL (ref 150–400)
RBC: 3.79 MIL/uL — AB (ref 4.22–5.81)
RDW: 15.1 % (ref 11.5–15.5)
WBC: 6.8 10*3/uL (ref 4.0–10.5)

## 2016-10-05 LAB — COMPREHENSIVE METABOLIC PANEL
ALT: 20 U/L (ref 17–63)
AST: 23 U/L (ref 15–41)
Albumin: 3.5 g/dL (ref 3.5–5.0)
Alkaline Phosphatase: 190 U/L — ABNORMAL HIGH (ref 38–126)
Anion gap: 6 (ref 5–15)
BUN: 14 mg/dL (ref 6–20)
CHLORIDE: 105 mmol/L (ref 101–111)
CO2: 28 mmol/L (ref 22–32)
Calcium: 8.7 mg/dL — ABNORMAL LOW (ref 8.9–10.3)
Creatinine, Ser: 0.65 mg/dL (ref 0.61–1.24)
GFR calc non Af Amer: >60 mL/min (ref >60)
Glucose, Bld: 92 mg/dL (ref 65–99)
POTASSIUM: 4.4 mmol/L (ref 3.5–5.1)
SODIUM: 139 mmol/L (ref 135–145)
Total Bilirubin: 0.8 mg/dL (ref 0.3–1.2)
Total Protein: 6.6 g/dL (ref 6.5–8.1)

## 2016-10-05 MED ORDER — RIVAROXABAN 15 MG PO TABS
15.0000 mg | ORAL_TABLET | Freq: Once | ORAL | Status: AC
  Administered 2016-10-05: 15 mg via ORAL
  Filled 2016-10-05: qty 1

## 2016-10-05 MED ORDER — RIVAROXABAN (XARELTO) VTE STARTER PACK (15 & 20 MG): 1.0000 "application " | ORAL_TABLET | Freq: Once | ORAL | 0 refills | Status: DC

## 2016-10-05 MED ORDER — RIVAROXABAN (XARELTO) VTE STARTER PACK (15 & 20 MG): ORAL_TABLET | ORAL | 0 refills | Status: DC

## 2016-10-05 MED ORDER — RIVAROXABAN (XARELTO) EDUCATION KIT FOR DVT/PE PATIENTS
PACK | Freq: Once | Status: AC
  Administered 2016-10-05: 17:00:00
  Filled 2016-10-05: qty 1

## 2016-10-05 MED FILL — XARELTO STARTER PACK: 15 & 20 | 28 days supply | Qty: 51 | Fill #0

## 2016-10-05 NOTE — ED Notes (Signed)
ED Provider at bedside. 

## 2016-10-05 NOTE — Discharge Instructions (Addendum)
HOLD YOUR ASPRIN until you follow up tomorrow with your primary care.   Information on my medicine - XARELTO (rivaroxaban)  This medication education was reviewed with me or my healthcare representative as part of my discharge preparation.    WHY WAS XARELTO PRESCRIBED FOR YOU? Xarelto was prescribed to treat blood clots that may have been found in the veins of your legs (deep vein thrombosis) or in your lungs (pulmonary embolism) and to reduce the risk of them occurring again.  WHAT DO YOU NEED TO KNOW ABOUT XARELTO? The starting dose is one 15 mg tablet taken TWICE daily with food for the FIRST 21 DAYS then on DAY 22, the dose is changed to one 20 mg tablet taken ONCE A DAY with your evening meal.  DO NOT stop taking Xarelto without talking to the health care provider who prescribed the medication.  Refill your prescription for 20 mg tablets before you run out.  After discharge, you should have regular check-up appointments with your healthcare provider that is prescribing your Xarelto.  In the future your dose may need to be changed if your kidney function changes by a significant amount.  WHAT DO YOU DO IF YOU MISS A DOSE? If you are taking Xarelto TWICE DAILY and you miss a dose, take it as soon as you remember. You may take two 15 mg tablets (total 30 mg) at the same time then resume your regularly scheduled 15 mg twice daily the next day.  If you are taking Xarelto ONCE DAILY and you miss a dose, take it as soon as you remember on the same day then continue your regularly scheduled once daily regimen the next day. Do not take two doses of Xarelto at the same time.   IMPORTANT SAFETY INFORMATION Xarelto is a blood thinner medicine that can cause bleeding. You should call your healthcare provider right away if you experience any of the following: Bleeding from an injury or your nose that does not stop. Unusual colored urine (red or dark brown) or unusual colored stools (red or  black). Unusual bruising for unknown reasons. A serious fall or if you hit your head (even if there is no bleeding).  Some medicines may interact with Xarelto and might increase your risk of bleeding while on Xarelto. To help avoid this, consult your healthcare provider or pharmacist prior to using any new prescription or non-prescription medications, including herbals, vitamins, non-steroidal anti-inflammatory drugs (NSAIDs) and supplements.  This website has more information on Xarelto: [email or web address]

## 2016-10-05 NOTE — ED Triage Notes (Signed)
Patient reports he was sent by PCP to have [place] of left leg for possible DVT. Patient seen at radiology who confirmed DVT to left lower leg. Reports pain with movement. Denies chest pain and SOB.

## 2016-10-05 NOTE — ED Provider Notes (Signed)
[place] DEPT Provider Note   CSN: [id number] Arrival date & time: 10/05/16  1341     History   Chief Complaint Chief Complaint  Patient presents with  . Leg Pain    HPI GRIFF BADLEY is a 83 y.o. male.  HPI   Patient is an 83 year old male presenting with a DVT. Patient was getting discharged from [facility] gait after rehabilitation stay for a left hip fracture. He complained of some increasing left lower extremity pain and was sent here for ultrasound. Ultrasound positive for DVT. Patient had no shortness breath or cough or respriatory symptoms.  Past Medical History:  Diagnosis Date  . Blindness of right eye   . BPH (benign prostatic hyperplasia)   . Essential hypertension   . HLD (hyperlipidemia)   . Hypothyroidism     Patient Active Problem List   Diagnosis Date Noted  . Closed fracture of left hip ([place]) 09/04/2016  . Constipation 09/04/2016  . Fall 09/04/2016  . BPH (benign prostatic hyperplasia) 09/04/2016  . Essential hypertension   . HLD (hyperlipidemia)   . Hypothyroidism     Past Surgical History:  Procedure Laterality Date  . bilateral hip replacement    . HERNIA REPAIR    . JOINT REPLACEMENT    . right knee replacement         Home Medications    Prior to Admission medications   Medication Sig Start Date End Date Taking? Authorizing Provider  acetaminophen (TYLENOL) 325 MG tablet Take 2 tablets (650 mg total) by mouth every 6 (six) hours as needed for mild pain, moderate pain, fever or headache. 09/08/16  Yes Modena Jansky, MD  alfuzosin (UROXATRAL) 10 MG 24 hr tablet Take 10 mg by mouth daily. 08/30/16  Yes Historical Provider, MD  aspirin (ASPIRIN EC) 81 MG EC tablet Take 81 mg by mouth daily. Swallow whole.    Yes Historical Provider, MD  atorvastatin (LIPITOR) 20 MG tablet Take 20 mg by mouth daily.   Yes Historical Provider, MD  levothyroxine (LEVOXYL) 100 MCG tablet Take 100 mcg by mouth daily.   Yes Historical Provider, MD    lisinopril (PRINIVIL,ZESTRIL) 5 MG tablet Take 5 mg by mouth daily. Take 11m by mouth if SBP is >140   Yes Historical Provider, MD  Multiple Vitamins-Minerals (MULTIVITAMIN WITH MINERALS) tablet Take 1 tablet by mouth daily.   Yes Historical Provider, MD  polyethylene glycol (MIRALAX / GLYCOLAX) packet Take 17 g by mouth daily. 09/09/16  Yes AModena Jansky MD  senna-docusate (SENOKOT-S) 8.6-50 MG tablet Take 1 tablet by mouth 2 (two) times daily. 09/08/16  Yes AModena Jansky MD  zolpidem (AMBIEN) 5 MG tablet Take 5 mg by mouth at bedtime as needed. sleep   Yes Historical Provider, MD  cephALEXin (KEFLEX) 500 MG capsule Take 1 capsule (500 mg total) by mouth 3 (three) times daily. Patient not taking: Reported on 10/05/2016 09/09/16   MTanna Furry MD  Rivaroxaban 15 & 20 MG TBPK Take as directed on package: Start with one 144mtablet by mouth twice a day with food. On Day 22, switch to one 2053mablet once a day with food. 10/05/16   Khadir Roam Lyn Eschol Auxier, MD    Family History Family History  Problem Relation Age of Onset  . Heart disease Father     Social History Social History  Substance Use Topics  . Smoking status: Former [employer] scientist (life sciences) Smokeless tobacco: Never Used  . Alcohol use No  Allergies   Bee venom and Milk-related compounds   Review of Systems Review of Systems  Constitutional: Negative for activity change.  Respiratory: Negative for shortness of breath.   Cardiovascular: Positive for leg swelling. Negative for chest pain.  Gastrointestinal: Negative for abdominal pain.  All other systems reviewed and are negative.    Physical Exam Updated Vital Signs BP 160/79 (BP Location: Right Arm)   Pulse 67   Temp 98.2 F (36.8 C) (Oral)   Resp 16   Ht 6' (1.829 m)   Wt 163 lb (73.9 kg)   SpO2 99%   BMI 22.11 kg/m   Physical Exam  Constitutional: He is oriented to person, place, and time. He appears well-nourished.  HENT:  Head: Normocephalic.  Right Ear:  External ear normal.  Left Ear: External ear normal.  BL TM normal  Eyes: Conjunctivae are normal.  Left eye closed, blind.  Neck: Normal range of motion.  Cardiovascular: Normal rate and regular rhythm.   Pulmonary/Chest: Effort normal and breath sounds normal. No respiratory distress.  Abdominal: Soft.  Neurological: He is oriented to person, place, and time.  Patient hearing  difficulty to communicate with.  Blind in his left eye.  Skin: Skin is warm and dry. He is not diaphoretic.  Left leg with bruising, swelling, pain to palpation of LLE  Psychiatric: He has a normal mood and affect.     ED Treatments / Results  Labs (all labs ordered are listed, but only abnormal results are displayed) Labs Reviewed  CBC WITH DIFFERENTIAL/PLATELET - Abnormal; Notable for the following:       Result Value   RBC 3.79 (*)    Hemoglobin 11.1 (*)    HCT 35.1 (*)    All other components within normal limits  COMPREHENSIVE METABOLIC PANEL - Abnormal; Notable for the following:    Calcium 8.7 (*)    Alkaline Phosphatase 190 (*)    All other components within normal limits    EKG  EKG Interpretation None       Radiology No results found.  Procedures Procedures (including critical care time)  Medications Ordered in ED Medications  rivaroxaban Alveda Reasons) Education Kit for DVT/PE patients ( Does not apply Given by Other 10/05/16 1657)  Rivaroxaban (XARELTO) tablet 15 mg (15 mg Oral Given 10/05/16 1628)     Initial Impression / Assessment and Plan / ED Course  I have reviewed the triage vital signs and the nursing notes.  Pertinent labs & imaging results that were available during my care of the patient were reviewed by me and considered in my medical decision making (see chart for details).     Patient is an 83 year old male on aspirin presenting with provoked DVT. Patient has no symptoms of PE, therefore will not scan. Will discuss with pharmacy for discharging on his Xarelto.  Patient will call his primary care physician tomorrow morning. Patient's family members are with him and will help coordinate this care.  Will have him hold asa till he talks with PCP.  Patient family in agreement.   Final Clinical Impressions(s) / ED Diagnoses   Final diagnoses:  Acute deep vein thrombosis (DVT) of distal vein of left lower extremity [facility])    New Prescriptions Discharge Medication List as of 10/05/2016  4:33 PM       Rehema Muffley Julio Alm, MD 10/05/16 2343

## 2016-10-05 NOTE — ED Notes (Signed)
Pharmacist at bedside.

## 2016-10-05 NOTE — Progress Notes (Signed)
*  Preliminary Results* Left lower extremity venous duplex completed. Left lower extremity is positive for acute deep vein thrombosis involving the left posterior tibial veins. There is no evidence of left Baker's cyst.  Preliminary results discussed with Dr. Lyla Glassing, who has advised the patient to visit the emergency department for treatment.  10/05/2016 1:31 PM  Maudry Mayhew, BS, RVT, RDCS, RDMS

## 2016-10-05 NOTE — ED Notes (Signed)
Discharge instructions, follow up care, and rx x1 reviewed with patient. Patient verbalized understanding. 

## 2016-10-06 DIAGNOSIS — I82402 Acute embolism and thrombosis of unspecified deep veins of left lower extremity: Secondary | ICD-10-CM | POA: Diagnosis not present

## 2016-10-06 DIAGNOSIS — K5641 Fecal impaction: Secondary | ICD-10-CM | POA: Diagnosis not present

## 2016-10-06 DIAGNOSIS — S72002A Fracture of unspecified part of neck of left femur, initial encounter for closed fracture: Secondary | ICD-10-CM | POA: Diagnosis not present

## 2016-10-06 DIAGNOSIS — I10 Essential (primary) hypertension: Secondary | ICD-10-CM | POA: Diagnosis not present

## 2016-10-06 DIAGNOSIS — R339 Retention of urine, unspecified: Secondary | ICD-10-CM | POA: Diagnosis not present

## 2016-10-06 DIAGNOSIS — E785 Hyperlipidemia, unspecified: Secondary | ICD-10-CM | POA: Diagnosis not present

## 2016-10-11 DIAGNOSIS — E559 Vitamin D deficiency, unspecified: Secondary | ICD-10-CM | POA: Diagnosis not present

## 2016-10-11 DIAGNOSIS — E039 Hypothyroidism, unspecified: Secondary | ICD-10-CM | POA: Diagnosis not present

## 2016-10-11 DIAGNOSIS — H9190 Unspecified hearing loss, unspecified ear: Secondary | ICD-10-CM | POA: Diagnosis not present

## 2016-10-11 DIAGNOSIS — E119 Type 2 diabetes mellitus without complications: Secondary | ICD-10-CM | POA: Diagnosis not present

## 2016-10-11 DIAGNOSIS — I10 Essential (primary) hypertension: Secondary | ICD-10-CM | POA: Diagnosis not present

## 2016-10-12 DIAGNOSIS — N400 Enlarged prostate without lower urinary tract symptoms: Secondary | ICD-10-CM | POA: Diagnosis not present

## 2016-10-12 DIAGNOSIS — I10 Essential (primary) hypertension: Secondary | ICD-10-CM | POA: Diagnosis not present

## 2016-10-12 DIAGNOSIS — G9340 Encephalopathy, unspecified: Secondary | ICD-10-CM | POA: Diagnosis not present

## 2016-10-12 DIAGNOSIS — I82402 Acute embolism and thrombosis of unspecified deep veins of left lower extremity: Secondary | ICD-10-CM | POA: Diagnosis not present

## 2016-10-12 DIAGNOSIS — S72002D Fracture of unspecified part of neck of left femur, subsequent encounter for closed fracture with routine healing: Secondary | ICD-10-CM | POA: Diagnosis not present

## 2016-10-12 DIAGNOSIS — M9702XD Periprosthetic fracture around internal prosthetic left hip joint, subsequent encounter: Secondary | ICD-10-CM | POA: Diagnosis not present

## 2016-10-14 DIAGNOSIS — N400 Enlarged prostate without lower urinary tract symptoms: Secondary | ICD-10-CM | POA: Diagnosis not present

## 2016-10-14 DIAGNOSIS — M9702XD Periprosthetic fracture around internal prosthetic left hip joint, subsequent encounter: Secondary | ICD-10-CM | POA: Diagnosis not present

## 2016-10-14 DIAGNOSIS — S72002D Fracture of unspecified part of neck of left femur, subsequent encounter for closed fracture with routine healing: Secondary | ICD-10-CM | POA: Diagnosis not present

## 2016-10-14 DIAGNOSIS — I82402 Acute embolism and thrombosis of unspecified deep veins of left lower extremity: Secondary | ICD-10-CM | POA: Diagnosis not present

## 2016-10-14 DIAGNOSIS — I10 Essential (primary) hypertension: Secondary | ICD-10-CM | POA: Diagnosis not present

## 2016-10-14 DIAGNOSIS — G9340 Encephalopathy, unspecified: Secondary | ICD-10-CM | POA: Diagnosis not present

## 2016-10-19 ENCOUNTER — Encounter (HOSPITAL_COMMUNITY): Payer: Self-pay

## 2016-10-19 ENCOUNTER — Emergency Department (HOSPITAL_COMMUNITY)
Admission: EM | Admit: 2016-10-19 | Discharge: 2016-10-19 | Disposition: A | Discharge status: Home / Self Care | Payer: Medicare Other | Attending: Emergency Medicine | Admitting: Emergency Medicine

## 2016-10-19 DIAGNOSIS — I10 Essential (primary) hypertension: Secondary | ICD-10-CM | POA: Diagnosis not present

## 2016-10-19 DIAGNOSIS — M9702XD Periprosthetic fracture around internal prosthetic left hip joint, subsequent encounter: Secondary | ICD-10-CM | POA: Diagnosis not present

## 2016-10-19 DIAGNOSIS — N400 Enlarged prostate without lower urinary tract symptoms: Secondary | ICD-10-CM | POA: Diagnosis not present

## 2016-10-19 DIAGNOSIS — I951 Orthostatic hypotension: Secondary | ICD-10-CM | POA: Insufficient documentation

## 2016-10-19 DIAGNOSIS — Z96651 Presence of right artificial knee joint: Secondary | ICD-10-CM | POA: Diagnosis not present

## 2016-10-19 DIAGNOSIS — E039 Hypothyroidism, unspecified: Secondary | ICD-10-CM | POA: Insufficient documentation

## 2016-10-19 DIAGNOSIS — R531 Weakness: Secondary | ICD-10-CM | POA: Diagnosis not present

## 2016-10-19 DIAGNOSIS — Z7901 Long term (current) use of anticoagulants: Secondary | ICD-10-CM | POA: Insufficient documentation

## 2016-10-19 DIAGNOSIS — Z87891 Personal history of nicotine dependence: Secondary | ICD-10-CM | POA: Insufficient documentation

## 2016-10-19 DIAGNOSIS — Z96643 Presence of artificial hip joint, bilateral: Secondary | ICD-10-CM | POA: Diagnosis not present

## 2016-10-19 DIAGNOSIS — S72002D Fracture of unspecified part of neck of left femur, subsequent encounter for closed fracture with routine healing: Secondary | ICD-10-CM | POA: Diagnosis not present

## 2016-10-19 DIAGNOSIS — R404 Transient alteration of awareness: Secondary | ICD-10-CM | POA: Diagnosis not present

## 2016-10-19 DIAGNOSIS — I959 Hypotension, unspecified: Secondary | ICD-10-CM | POA: Diagnosis present

## 2016-10-19 DIAGNOSIS — Z79899 Other long term (current) drug therapy: Secondary | ICD-10-CM | POA: Diagnosis not present

## 2016-10-19 DIAGNOSIS — I82402 Acute embolism and thrombosis of unspecified deep veins of left lower extremity: Secondary | ICD-10-CM | POA: Diagnosis not present

## 2016-10-19 DIAGNOSIS — G9340 Encephalopathy, unspecified: Secondary | ICD-10-CM | POA: Diagnosis not present

## 2016-10-19 LAB — URINALYSIS, ROUTINE W REFLEX MICROSCOPIC
Bilirubin Urine: NEGATIVE
GLUCOSE, UA: NEGATIVE mg/dL
HGB URINE DIPSTICK: NEGATIVE
KETONES UR: NEGATIVE mg/dL
LEUKOCYTES UA: NEGATIVE
Nitrite: NEGATIVE
PROTEIN: NEGATIVE mg/dL
SQUAMOUS EPITHELIAL / LPF: NONE SEEN
Specific Gravity, Urine: 1.009 (ref 1.005–1.030)
pH: 8 (ref 5.0–8.0)

## 2016-10-19 LAB — CBC WITH DIFFERENTIAL/PLATELET
BASOS PCT: 0 %
Basophils Absolute: 0 10*3/uL (ref 0.0–0.1)
EOS PCT: 1 %
Eosinophils Absolute: 0.1 10*3/uL (ref 0.0–0.7)
HCT: 34.6 % — ABNORMAL LOW (ref 39.0–52.0)
Hemoglobin: 11.3 g/dL — ABNORMAL LOW (ref 13.0–17.0)
Lymphocytes Relative: 23 %
Lymphs Abs: 1.1 10*3/uL (ref 0.7–4.0)
MCH: 30 pg (ref 26.0–34.0)
MCHC: 32.7 g/dL (ref 30.0–36.0)
MCV: 91.8 fL (ref 78.0–100.0)
MONOS PCT: 12 %
Monocytes Absolute: 0.5 10*3/uL (ref 0.1–1.0)
Neutro Abs: 2.9 10*3/uL (ref 1.7–7.7)
Neutrophils Relative %: 64 %
PLATELETS: 252 10*3/uL (ref 150–400)
RBC: 3.77 MIL/uL — ABNORMAL LOW (ref 4.22–5.81)
RDW: 14.8 % (ref 11.5–15.5)
WBC: 4.5 10*3/uL (ref 4.0–10.5)

## 2016-10-19 LAB — BASIC METABOLIC PANEL
Anion gap: 4 — ABNORMAL LOW (ref 5–15)
BUN: 16 mg/dL (ref 6–20)
CALCIUM: 9 mg/dL (ref 8.9–10.3)
CO2: 28 mmol/L (ref 22–32)
Chloride: 107 mmol/L (ref 101–111)
Creatinine, Ser: 0.78 mg/dL (ref 0.61–1.24)
GFR calc Af Amer: >60 mL/min (ref >60)
GLUCOSE: 110 mg/dL — AB (ref 65–99)
Potassium: 5 mmol/L (ref 3.5–5.1)
Sodium: 139 mmol/L (ref 135–145)

## 2016-10-19 LAB — I-STAT CG4 LACTIC ACID, ED: Lactic Acid, Venous: 0.83 mmol/L (ref 0.5–1.9)

## 2016-10-19 LAB — PROTIME-INR
INR: 2.25
Prothrombin Time: 25.3 seconds — ABNORMAL HIGH (ref 11.4–15.2)

## 2016-10-19 LAB — I-STAT TROPONIN, ED: TROPONIN I, POC: 0.01 ng/mL (ref 0.00–0.08)

## 2016-10-19 NOTE — ED Notes (Signed)
Bed: IW80 Expected date:  Expected time:  Means of arrival:  Comments: EMS-hypotension

## 2016-10-19 NOTE — ED Triage Notes (Signed)
Per EMS- Patient lives at home and Physical therapist was at the home. Patient had BP systolic in the 86'V. EMS arrived. BP sitting 80/40 and standing 68/32. Patient was given 700 ml NS and BP increased 118/62.

## 2016-10-19 NOTE — Discharge Instructions (Signed)
-   Do NOT take your lisinopril until cleared by your primary doctor  - In the mornings, it is okay to take your: - Atorvastatin - Levothyroxine - Xarelto  But do NOT take your alfuzosin 10 mg 24 hr tablet (Uroxatral) until night time. This medication is likely causing your low blood pressure.  Drink plenty of water.  Follow-up tomorrow with your Orthopedist

## 2016-10-19 NOTE — ED Provider Notes (Signed)
[place] DEPT Provider Note   CSN: [id number] Arrival date & time: 10/19/16  1238     History   Chief Complaint Chief Complaint  Patient presents with  . Hypotension    HPI Gene Hunter is a 83 y.o. male.  HPI Patient reports that he felt fine this morning. His physical therapist came to start rehabilitation therapy and checked his blood pressure. They reported the blood pressure was low.reportedly when he stood up the blood pressure was 60 systolic. Patient reports he feels like he's been in his baseline. He has not had fevers, chills. He has not had vomiting or diarrhea. He has not had shortness of breath or chest pain. Past Medical History:  Diagnosis Date  . Blindness of right eye   . BPH (benign prostatic hyperplasia)   . Essential hypertension   . HLD (hyperlipidemia)   . Hypothyroidism     Patient Active Problem List   Diagnosis Date Noted  . Closed fracture of left hip ([place]) 09/04/2016  . Constipation 09/04/2016  . Fall 09/04/2016  . BPH (benign prostatic hyperplasia) 09/04/2016  . Essential hypertension   . HLD (hyperlipidemia)   . Hypothyroidism     Past Surgical History:  Procedure Laterality Date  . bilateral hip replacement    . HERNIA REPAIR    . JOINT REPLACEMENT    . right knee replacement         Home Medications    Prior to Admission medications   Medication Sig Start Date End Date Taking? Authorizing Provider  acetaminophen (TYLENOL) 325 MG tablet Take 2 tablets (650 mg total) by mouth every 6 (six) hours as needed for mild pain, moderate pain, fever or headache. 09/08/16  Yes Modena Jansky, MD  alfuzosin (UROXATRAL) 10 MG 24 hr tablet Take 10 mg by mouth daily with breakfast.    Yes Historical Provider, MD  atorvastatin (LIPITOR) 20 MG tablet Take 20 mg by mouth daily.   Yes Historical Provider, MD  calcium carbonate (OSCAL) 1500 (600 Ca) MG TABS tablet Take 600 mg of elemental calcium by mouth daily with breakfast.   Yes  Historical Provider, MD  cholecalciferol (VITAMIN D) 1000 units tablet Take 1,000 Units by mouth daily.   Yes Historical Provider, MD  levothyroxine (LEVOXYL) 100 MCG tablet Take 100 mcg by mouth daily before breakfast.    Yes Historical Provider, MD  lisinopril (PRINIVIL,ZESTRIL) 5 MG tablet Take 5 mg by mouth daily. Pt only takes if SBP is >140.   Yes Historical Provider, MD  Multiple Vitamins-Minerals (MULTIVITAMIN WITH MINERALS) tablet Take 1 tablet by mouth daily.   Yes Historical Provider, MD  polyethylene glycol (MIRALAX / GLYCOLAX) packet Take 17 g by mouth daily. 09/09/16  Yes Modena Jansky, MD  rivaroxaban (XARELTO) 20 MG TABS tablet Take 20 mg by mouth daily with breakfast.   Yes Historical Provider, MD  senna-docusate (SENOKOT-S) 8.6-50 MG tablet Take 1 tablet by mouth 2 (two) times daily. Patient taking differently: Take 1 tablet by mouth 2 (two) times daily as needed for mild constipation.  09/08/16  Yes Modena Jansky, MD    Family History Family History  Problem Relation Age of Onset  . Heart disease Father     Social History Social History  Substance Use Topics  . Smoking status: Former Research scientist (life sciences)  . Smokeless tobacco: Never Used  . Alcohol use No     Allergies   Bee venom and Milk-related compounds   Review of Systems Review of Systems  10 Systems reviewed and are negative for acute change except as noted in the HPI.   Physical Exam Updated Vital Signs BP 130/88   Pulse 75   Temp 98 F (36.7 C) (Oral)   Resp 14   Ht 6' (1.829 m)   Wt 163 lb (73.9 kg)   SpO2 99%   BMI 22.11 kg/m   Physical Exam  Constitutional: He is oriented to person, place, and time. He appears well-developed and well-nourished.  HENT:  Head: Normocephalic and atraumatic.  Mouth/Throat: Oropharynx is clear and moist.  Eyes: Conjunctivae are normal.  Opacification of left cornea  Neck: Neck supple.  Cardiovascular: Normal rate and regular rhythm.   Murmur heard. 2/6 systolic  ejection murmur  Pulmonary/Chest: Effort normal and breath sounds normal. No respiratory distress.  Abdominal: Soft. There is no tenderness.  Musculoskeletal: He exhibits no edema.  Neurological: He is alert and oriented to person, place, and time. No cranial nerve deficit. He exhibits normal muscle tone. Coordination normal.  Skin: Skin is warm and dry.  Psychiatric: He has a normal mood and affect.  Nursing note and vitals reviewed.    ED Treatments / Results  Labs (all labs ordered are listed, but only abnormal results are displayed) Labs Reviewed  CBC WITH DIFFERENTIAL/PLATELET - Abnormal; Notable for the following:       Result Value   RBC 3.77 (*)    Hemoglobin 11.3 (*)    HCT 34.6 (*)    All other components within normal limits  PROTIME-INR - Abnormal; Notable for the following:    Prothrombin Time 25.3 (*)    All other components within normal limits  URINALYSIS, ROUTINE W REFLEX MICROSCOPIC  BASIC METABOLIC PANEL  I-STAT TROPOININ, ED    EKG  EKG Interpretation  Date/Time:  Tuesday October 19 2016 15:22:02 EDT Ventricular Rate:  76 PR Interval:    QRS Duration: 132 QT Interval:  421 QTC Calculation: 474 R Axis:   15 Text Interpretation:  Sinus rhythm Prolonged PR interval IVCD, consider atypical RBBB agree. no change from old Confirmed by Johnney Killian, MD, Jeannie Done [phone]) on 10/19/2016 5:12:13 PM       Radiology No results found.  Procedures Procedures (including critical care time)  Medications Ordered in ED Medications - No data to display   Initial Impression / Assessment and Plan / ED Course  I have reviewed the triage vital signs and the nursing notes.  Pertinent labs & imaging results that were available during my care of the patient were reviewed by me and considered in my medical decision making (see chart for details).       Final Clinical Impressions(s) / ED Diagnoses   Final diagnoses:  Orthostatic hypotension  Patient had asymptomatic  hypotension documented at home this morning. Review of systems is otherwise negative for any acute changes. On examination the patient is alert and well appearance. I suspect hypotension this morning was due to blood pressure medications. Basic chemistry panel still pending which will be followed up on by Dr. Ellender Hose. Pending return of normal chemistries I feel patient is stable for discharge.  New Prescriptions New Prescriptions   No medications on file     Charlesetta Shanks, MD 10/20/16 [id number]

## 2016-10-19 NOTE — ED Provider Notes (Signed)
Assumed care from Dr. Johnney Killian at 4 PM. Briefly, the patient is a 83 yo M here with transient hypotension after taking his home BP meds. Pt was asymptomatic and orthostatic at home. He is now hypertensive and asymptomatic. EKG unremarkable and trop neg. screening lab work is unremarkable. Patient is ready to be discharged but BMP hemolyzed and we are waiting on repeat..   Labs Reviewed  CBC WITH DIFFERENTIAL/PLATELET - Abnormal; Notable for the following:       Result Value   RBC 3.77 (*)    Hemoglobin 11.3 (*)    HCT 34.6 (*)    All other components within normal limits  PROTIME-INR - Abnormal; Notable for the following:    Prothrombin Time 25.3 (*)    All other components within normal limits  URINALYSIS, ROUTINE W REFLEX MICROSCOPIC - Abnormal; Notable for the following:    Bacteria, UA RARE (*)    All other components within normal limits  BASIC METABOLIC PANEL - Abnormal; Notable for the following:    Glucose, Bld 110 (*)    Anion gap 4 (*)    All other components within normal limits  I-STAT TROPOININ, ED  I-STAT CG4 LACTIC ACID, ED    Course of Care: BMP shows normal renal function with no acute abnormalities. I also added on a lactic acid which is normal, with no evidence of hypoperfusion. Patient is asymptomatic. Of note, he takes an alpha blocker in the mornings, which I suspect is contributing to his orthostasis and symptoms. I will have him split this to take it at night, and advised PCP follow-up. Otherwise, he has no evidence of acute infection, ischemia, or other abnormality. He has not been hypertensive in the ED at all. He was like to return home and I feel this is reasonable.  Clinical Impression: 1. Orthostatic hypotension     Disposition: Discharge  Condition: Good  I have discussed the results, Dx and Tx plan with the pt(& family if present). He/she/they expressed understanding and agree(s) with the plan. Discharge instructions discussed at great length. Strict  return precautions discussed and pt &/or family have verbalized understanding of the instructions. No further questions at time of discharge.    New Prescriptions   No medications on file    Follow Up: [name], [place] [address] [place] [address] [phone]  In 3 days For repeat exam and blood pressure check       Duffy Bruce, MD 10/19/16 1925

## 2016-10-20 DIAGNOSIS — S7222XD Displaced subtrochanteric fracture of left femur, subsequent encounter for closed fracture with routine healing: Secondary | ICD-10-CM | POA: Diagnosis not present

## 2016-10-20 DIAGNOSIS — S72115D Nondisplaced fracture of greater trochanter of left femur, subsequent encounter for closed fracture with routine healing: Secondary | ICD-10-CM | POA: Diagnosis not present

## 2016-10-21 ENCOUNTER — Emergency Department (HOSPITAL_COMMUNITY)
Admission: EM | Admit: 2016-10-21 | Discharge: 2016-10-21 | Disposition: A | Discharge status: Home / Self Care | Payer: Medicare Other | Attending: Emergency Medicine | Admitting: Emergency Medicine

## 2016-10-21 ENCOUNTER — Encounter (HOSPITAL_COMMUNITY): Payer: Self-pay

## 2016-10-21 ENCOUNTER — Emergency Department (HOSPITAL_COMMUNITY): Payer: Medicare Other

## 2016-10-21 DIAGNOSIS — Z96651 Presence of right artificial knee joint: Secondary | ICD-10-CM | POA: Insufficient documentation

## 2016-10-21 DIAGNOSIS — Z87891 Personal history of nicotine dependence: Secondary | ICD-10-CM | POA: Diagnosis not present

## 2016-10-21 DIAGNOSIS — K625 Hemorrhage of anus and rectum: Secondary | ICD-10-CM | POA: Insufficient documentation

## 2016-10-21 DIAGNOSIS — I10 Essential (primary) hypertension: Secondary | ICD-10-CM | POA: Diagnosis not present

## 2016-10-21 DIAGNOSIS — E039 Hypothyroidism, unspecified: Secondary | ICD-10-CM | POA: Insufficient documentation

## 2016-10-21 DIAGNOSIS — Z7902 Long term (current) use of antithrombotics/antiplatelets: Secondary | ICD-10-CM | POA: Insufficient documentation

## 2016-10-21 DIAGNOSIS — Z96643 Presence of artificial hip joint, bilateral: Secondary | ICD-10-CM | POA: Diagnosis not present

## 2016-10-21 DIAGNOSIS — Z79899 Other long term (current) drug therapy: Secondary | ICD-10-CM | POA: Diagnosis not present

## 2016-10-21 DIAGNOSIS — R195 Other fecal abnormalities: Secondary | ICD-10-CM | POA: Diagnosis not present

## 2016-10-21 LAB — CBC
HCT: 35.3 % — ABNORMAL LOW (ref 39.0–52.0)
HEMOGLOBIN: 11.4 g/dL — AB (ref 13.0–17.0)
MCH: 29.7 pg (ref 26.0–34.0)
MCHC: 32.3 g/dL (ref 30.0–36.0)
MCV: 91.9 fL (ref 78.0–100.0)
PLATELETS: 258 10*3/uL (ref 150–400)
RBC: 3.84 MIL/uL — AB (ref 4.22–5.81)
RDW: 14.8 % (ref 11.5–15.5)
WBC: 5 10*3/uL (ref 4.0–10.5)

## 2016-10-21 LAB — COMPREHENSIVE METABOLIC PANEL
ALT: 17 U/L (ref 17–63)
AST: 17 U/L (ref 15–41)
Albumin: 3.5 g/dL (ref 3.5–5.0)
Alkaline Phosphatase: 157 U/L — ABNORMAL HIGH (ref 38–126)
Anion gap: 5 (ref 5–15)
BUN: 19 mg/dL (ref 6–20)
CHLORIDE: 106 mmol/L (ref 101–111)
CO2: 27 mmol/L (ref 22–32)
CREATININE: 0.86 mg/dL (ref 0.61–1.24)
Calcium: 9 mg/dL (ref 8.9–10.3)
Glucose, Bld: 115 mg/dL — ABNORMAL HIGH (ref 65–99)
POTASSIUM: 4.2 mmol/L (ref 3.5–5.1)
SODIUM: 138 mmol/L (ref 135–145)
Total Bilirubin: 0.9 mg/dL (ref 0.3–1.2)
Total Protein: 6.6 g/dL (ref 6.5–8.1)

## 2016-10-21 LAB — TYPE AND SCREEN
ABO/RH(D): A POS
Antibody Screen: NEGATIVE

## 2016-10-21 LAB — POC OCCULT BLOOD, ED: FECAL OCCULT BLD: POSITIVE — AB

## 2016-10-21 MED ORDER — DOCUSATE SODIUM 100 MG PO CAPS: 100.0000 mg | ORAL_CAPSULE | Freq: Two times a day (BID) | ORAL | 0 refills | Status: DC

## 2016-10-21 NOTE — ED Notes (Signed)
Bed: WA12 Expected date:  Expected time:  Means of arrival:  Comments: Triage 1  

## 2016-10-21 NOTE — ED Triage Notes (Signed)
Patient c/o rectal bleeding-bright red in color. no clots this AM.  Patient denies any pain.

## 2016-10-21 NOTE — Discharge Instructions (Signed)
Please start taking the Colace 2 times a day. Please increase the MiraLAX to twice per day until solid bowel movement but no longer than 4 days. May continue using the senakot as needed. Somebody from [facility] will  call you for an appointment in the office this week or next week. Have given either number to follow-up with them tomorrow. Please return to the ED if he develop any worsening rectal bleeding, palpitations, fatigue, dizziness, paleness or for any reason.

## 2016-10-21 NOTE — ED Provider Notes (Signed)
[facility] DEPT Provider Note   CSN: [id number] Arrival date & time: 10/21/16  1041     History   Chief Complaint Chief Complaint  Patient presents with  . Rectal Bleeding    HPI Gene Hunter is a 83 y.o. male.  HPI 83 year old Caucasian male past medical history significant for hypertension, hyperlipidemia, hypothyroidism, DVT currently on Xarelto, chronic constipation presents to the ED today with complaints of rectal bleeding. Son is at bedside. States that he had an episode of bright red blood mixed in with stool bowel movement today without any clots. Patient endorses chronic constipation and is on Senokot and MiraLAX. States that he has to strain to have a bowel movement this AM. Patient denies any abdominal pain. Denies any fever, nausea, emesis, urinary symptoms. Denies any melena. Patient's her last colonoscopy. States he was started on Xarelto 3 months ago for DVT. Denies any other associated complaints. Past Medical History:  Diagnosis Date  . Blindness of right eye   . BPH (benign prostatic hyperplasia)   . Essential hypertension   . HLD (hyperlipidemia)   . Hypothyroidism     Patient Active Problem List   Diagnosis Date Noted  . Closed fracture of left hip ([place]) 09/04/2016  . Constipation 09/04/2016  . Fall 09/04/2016  . BPH (benign prostatic hyperplasia) 09/04/2016  . Essential hypertension   . HLD (hyperlipidemia)   . Hypothyroidism     Past Surgical History:  Procedure Laterality Date  . bilateral hip replacement    . HERNIA REPAIR    . JOINT REPLACEMENT    . right knee replacement         Home Medications    Prior to Admission medications   Medication Sig Start Date End Date Taking? Authorizing Provider  acetaminophen (TYLENOL) 325 MG tablet Take 2 tablets (650 mg total) by mouth every 6 (six) hours as needed for mild pain, moderate pain, fever or headache. 09/08/16  Yes Modena Jansky, MD  alfuzosin (UROXATRAL) 10 MG 24 hr tablet Take  10 mg by mouth daily with breakfast.   Yes Historical Provider, MD  atorvastatin (LIPITOR) 20 MG tablet Take 20 mg by mouth daily.   Yes Historical Provider, MD  calcium carbonate (OSCAL) 1500 (600 Ca) MG TABS tablet Take 600 mg of elemental calcium by mouth daily with breakfast.   Yes Historical Provider, MD  cholecalciferol (VITAMIN D) 1000 units tablet Take 1,000 Units by mouth daily.   Yes Historical Provider, MD  levothyroxine (LEVOXYL) 100 MCG tablet Take 100 mcg by mouth daily before breakfast.    Yes Historical Provider, MD  lisinopril (PRINIVIL,ZESTRIL) 5 MG tablet Take 5 mg by mouth daily. Pt only takes if SBP is >140.   Yes Historical Provider, MD  Multiple Vitamins-Minerals (MULTIVITAMIN WITH MINERALS) tablet Take 1 tablet by mouth daily.   Yes Historical Provider, MD  polyethylene glycol (MIRALAX / GLYCOLAX) packet Take 17 g by mouth daily. 09/09/16  Yes Modena Jansky, MD  Rivaroxaban (XARELTO STARTER PACK) 15 & 20 MG TBPK Take 15-20 mg by mouth as directed. Take as directed on package: Start with one 15mg  tablet by mouth twice a day with food. On Day 22, switch to one 20mg  tablet once a day with food.    Yes Historical Provider, MD  senna-docusate (SENOKOT-S) 8.6-50 MG tablet Take 1 tablet by mouth 2 (two) times daily. Patient taking differently: Take 1 tablet by mouth 2 (two) times daily as needed for mild constipation.  09/08/16  Yes  Modena Jansky, MD  docusate sodium (COLACE) 100 MG capsule Take 1 capsule (100 mg total) by mouth 2 (two) times daily. 10/21/16   Doristine Devoid, PA-C    Family History Family History  Problem Relation Age of Onset  . Heart disease Father     Social History Social History  Substance Use Topics  . Smoking status: Former Research scientist (life sciences)  . Smokeless tobacco: Never Used  . Alcohol use No     Allergies   Bee venom and Milk-related compounds   Review of Systems Review of Systems  Constitutional: Negative for chills and fever.  HENT: Negative  for congestion.   Eyes: Negative for visual disturbance.  Respiratory: Negative for cough and shortness of breath.   Cardiovascular: Negative for chest pain and palpitations.  Gastrointestinal: Positive for blood in stool and constipation. Negative for abdominal pain, diarrhea, nausea, rectal pain and vomiting.  Genitourinary: Negative for dysuria, flank pain, frequency, hematuria and urgency.  Musculoskeletal: Negative for back pain.  Skin: Negative.   Neurological: Negative for dizziness, syncope, weakness, light-headedness, numbness and headaches.     Physical Exam Updated Vital Signs BP 126/74 (BP Location: Left Arm)   Pulse 76   Temp 97.6 F (36.4 C) (Oral)   Resp 16   Ht 6' (1.829 m)   Wt 73.9 kg   SpO2 99%   BMI 22.11 kg/m   Physical Exam  Constitutional: He is oriented to person, place, and time. He appears well-developed and well-nourished. No distress.  HENT:  Head: Normocephalic and atraumatic.  Mouth/Throat: Oropharynx is clear and moist.  Eyes: Conjunctivae are normal. Right eye exhibits no discharge. Left eye exhibits no discharge. No scleral icterus.  Neck: Normal range of motion. Neck supple. No thyromegaly present.  Cardiovascular: Normal rate, regular rhythm and intact distal pulses.   Murmur (systolic is baseline) heard. Pulmonary/Chest: Effort normal and breath sounds normal. No respiratory distress. He has no wheezes. He has no rales. He exhibits no tenderness.  Abdominal: Soft. Bowel sounds are normal. He exhibits no distension. There is no tenderness. There is no rebound and no guarding.  Genitourinary:  Genitourinary Comments: Chaperone present for exam. Patient tolerated without any difficulties. Soft brown stool noted in rectal vault. Pink tinge to the stool. No frank hematochezia or melena. Normal rectal tone. Nonthrombosed external hemorrhoids noted with mild tenderness to palpation of the rectal vault concerning for internal hemorrhoid.    Musculoskeletal: Normal range of motion.  Lymphadenopathy:    He has no cervical adenopathy.  Neurological: He is alert and oriented to person, place, and time.  Skin: Skin is warm and dry. Capillary refill takes less than 2 seconds.  Nursing note and vitals reviewed.    ED Treatments / Results  Labs (all labs ordered are listed, but only abnormal results are displayed) Labs Reviewed  COMPREHENSIVE METABOLIC PANEL - Abnormal; Notable for the following:       Result Value   Glucose, Bld 115 (*)    Alkaline Phosphatase 157 (*)    All other components within normal limits  CBC - Abnormal; Notable for the following:    RBC 3.84 (*)    Hemoglobin 11.4 (*)    HCT 35.3 (*)    All other components within normal limits  POC OCCULT BLOOD, ED - Abnormal; Notable for the following:    Fecal Occult Bld POSITIVE (*)    All other components within normal limits  TYPE AND SCREEN    EKG  EKG Interpretation None  Radiology Dg Abdomen Acute W/chest  Result Date: 10/21/2016 CLINICAL DATA:  Bloody bowel movement. EXAM: DG ABDOMEN ACUTE W/ 1V CHEST COMPARISON:  09/09/2016. FINDINGS: Mediastinum hilar structures are normal. Lungs are clear of acute infiltrates. Bilateral nipple shadows again noted. No pleural effusion or pneumothorax. Soft tissues the abdomen are unremarkable. No bowel distention. Nondilated air-filled loops of small large bowel noted. Bilateral hip replacements. Degenerative changes noted throughout the spine. IMPRESSION: No acute chest or intra-abdominal abnormality identified. Electronically Signed   By: Marcello Moores  Register   On: 10/21/2016 12:18    Procedures Procedures (including critical care time)  Medications Ordered in ED Medications - No data to display   Initial Impression / Assessment and Plan / ED Course  I have reviewed the triage vital signs and the nursing notes.  Pertinent labs & imaging results that were available during my care of the patient were  reviewed by me and considered in my medical decision making (see chart for details).     83 year old Caucasian male past medical history significant for DVT currently on his relative, hypertension, hyperlipidemia, chronic constipation presents to the ED today with complaints of rectal bleeding. Patient episode of bright red blood per rectum this morning. Does have a history of chronic constipation. Denies any other associated complaints including fever, nausea, emesis, urinary symptoms. Hemoglobin is stable at 11.4. Has not had any episodes of hematochezia or melena in the ED. Rectal exam shows nonthrombosed internal and external hemorrhoids. Fecal occult was positive. All other labs unremarkable. Feel the patient's rectal bleeding likely due to hemorrhoids and his chronic constipation. Spoke with Ellouise Newer PA-C with [facility]. Feels the patient could be followed up on an outpatient setting for likely colonoscopy. She will schedule appointment and they will call for an appointment. Agree with these recommendations. Patient's hemoglobin is stable. Has no episodes of bright red blood per rectum in the ED. Patient is not tachycardic or hypotensive. The patient is on xarelt has been taking the past 3 months. Feel that the bright red blood is due to hemorrhoids. Patient was seen and examined by Dr. Theressa Millard is agreeable with the above plan. The patient was started on Colace. Will continue Senokot and MiraLAX for constipation. Will see GI in the office. Patient and son updated on plan of care. Did not feel any further medical management is necessary at this time. Have given strict return precautions if persistent gi bleed. Son and patient are agreeable to above plan.   Final Clinica l Impressions(s) / ED Diagnoses   Final diagnoses:  Rectal bleeding    New Prescriptions Discharge Medication List as of 10/21/2016  2:27 PM    START taking these medications   Details  docusate sodium (COLACE) 100 MG  capsule Take 1 capsule (100 mg total) by mouth 2 (two) times daily., Starting Thu 10/21/2016, Print         Doristine Devoid, PA-C 10/21/16 1631    Charlesetta Shanks, MD 10/25/16 [phone]

## 2016-10-25 DIAGNOSIS — M9702XD Periprosthetic fracture around internal prosthetic left hip joint, subsequent encounter: Secondary | ICD-10-CM | POA: Diagnosis not present

## 2016-10-25 DIAGNOSIS — I10 Essential (primary) hypertension: Secondary | ICD-10-CM | POA: Diagnosis not present

## 2016-10-25 DIAGNOSIS — G9340 Encephalopathy, unspecified: Secondary | ICD-10-CM | POA: Diagnosis not present

## 2016-10-25 DIAGNOSIS — N400 Enlarged prostate without lower urinary tract symptoms: Secondary | ICD-10-CM | POA: Diagnosis not present

## 2016-10-25 DIAGNOSIS — S72002D Fracture of unspecified part of neck of left femur, subsequent encounter for closed fracture with routine healing: Secondary | ICD-10-CM | POA: Diagnosis not present

## 2016-10-25 DIAGNOSIS — I82402 Acute embolism and thrombosis of unspecified deep veins of left lower extremity: Secondary | ICD-10-CM | POA: Diagnosis not present

## 2016-10-26 ENCOUNTER — Ambulatory Visit: Payer: PRIVATE HEALTH INSURANCE | Admitting: Nurse Practitioner

## 2016-10-27 DIAGNOSIS — I10 Essential (primary) hypertension: Secondary | ICD-10-CM | POA: Diagnosis not present

## 2016-10-27 DIAGNOSIS — N400 Enlarged prostate without lower urinary tract symptoms: Secondary | ICD-10-CM | POA: Diagnosis not present

## 2016-10-27 DIAGNOSIS — M9702XD Periprosthetic fracture around internal prosthetic left hip joint, subsequent encounter: Secondary | ICD-10-CM | POA: Diagnosis not present

## 2016-10-27 DIAGNOSIS — G9340 Encephalopathy, unspecified: Secondary | ICD-10-CM | POA: Diagnosis not present

## 2016-10-27 DIAGNOSIS — S72002D Fracture of unspecified part of neck of left femur, subsequent encounter for closed fracture with routine healing: Secondary | ICD-10-CM | POA: Diagnosis not present

## 2016-10-27 DIAGNOSIS — I82402 Acute embolism and thrombosis of unspecified deep veins of left lower extremity: Secondary | ICD-10-CM | POA: Diagnosis not present

## 2016-10-28 DIAGNOSIS — N400 Enlarged prostate without lower urinary tract symptoms: Secondary | ICD-10-CM | POA: Diagnosis not present

## 2016-10-28 DIAGNOSIS — S72002D Fracture of unspecified part of neck of left femur, subsequent encounter for closed fracture with routine healing: Secondary | ICD-10-CM | POA: Diagnosis not present

## 2016-10-28 DIAGNOSIS — G9340 Encephalopathy, unspecified: Secondary | ICD-10-CM | POA: Diagnosis not present

## 2016-10-28 DIAGNOSIS — I10 Essential (primary) hypertension: Secondary | ICD-10-CM | POA: Diagnosis not present

## 2016-10-28 DIAGNOSIS — I82402 Acute embolism and thrombosis of unspecified deep veins of left lower extremity: Secondary | ICD-10-CM | POA: Diagnosis not present

## 2016-10-28 DIAGNOSIS — M9702XD Periprosthetic fracture around internal prosthetic left hip joint, subsequent encounter: Secondary | ICD-10-CM | POA: Diagnosis not present

## 2016-11-03 DIAGNOSIS — S72002D Fracture of unspecified part of neck of left femur, subsequent encounter for closed fracture with routine healing: Secondary | ICD-10-CM | POA: Diagnosis not present

## 2016-11-03 DIAGNOSIS — M9702XD Periprosthetic fracture around internal prosthetic left hip joint, subsequent encounter: Secondary | ICD-10-CM | POA: Diagnosis not present

## 2016-11-03 DIAGNOSIS — H9193 Unspecified hearing loss, bilateral: Secondary | ICD-10-CM | POA: Insufficient documentation

## 2016-11-03 DIAGNOSIS — K625 Hemorrhage of anus and rectum: Secondary | ICD-10-CM | POA: Diagnosis not present

## 2016-11-03 DIAGNOSIS — H6503 Acute serous otitis media, bilateral: Secondary | ICD-10-CM | POA: Diagnosis not present

## 2016-11-03 DIAGNOSIS — I10 Essential (primary) hypertension: Secondary | ICD-10-CM | POA: Diagnosis not present

## 2016-11-03 DIAGNOSIS — K5900 Constipation, unspecified: Secondary | ICD-10-CM | POA: Diagnosis not present

## 2016-11-03 DIAGNOSIS — G9340 Encephalopathy, unspecified: Secondary | ICD-10-CM | POA: Diagnosis not present

## 2016-11-03 DIAGNOSIS — R195 Other fecal abnormalities: Secondary | ICD-10-CM | POA: Diagnosis not present

## 2016-11-03 DIAGNOSIS — N400 Enlarged prostate without lower urinary tract symptoms: Secondary | ICD-10-CM | POA: Diagnosis not present

## 2016-11-03 DIAGNOSIS — I82402 Acute embolism and thrombosis of unspecified deep veins of left lower extremity: Secondary | ICD-10-CM | POA: Diagnosis not present

## 2016-11-11 DIAGNOSIS — S72002D Fracture of unspecified part of neck of left femur, subsequent encounter for closed fracture with routine healing: Secondary | ICD-10-CM | POA: Diagnosis not present

## 2016-11-11 DIAGNOSIS — I82402 Acute embolism and thrombosis of unspecified deep veins of left lower extremity: Secondary | ICD-10-CM | POA: Diagnosis not present

## 2016-11-11 DIAGNOSIS — G9340 Encephalopathy, unspecified: Secondary | ICD-10-CM | POA: Diagnosis not present

## 2016-11-22 DIAGNOSIS — D225 Melanocytic nevi of trunk: Secondary | ICD-10-CM | POA: Diagnosis not present

## 2016-11-22 DIAGNOSIS — R972 Elevated prostate specific antigen [PSA]: Secondary | ICD-10-CM | POA: Diagnosis not present

## 2016-11-22 DIAGNOSIS — L821 Other seborrheic keratosis: Secondary | ICD-10-CM | POA: Diagnosis not present

## 2016-11-22 DIAGNOSIS — D692 Other nonthrombocytopenic purpura: Secondary | ICD-10-CM | POA: Diagnosis not present

## 2016-11-22 DIAGNOSIS — D1801 Hemangioma of skin and subcutaneous tissue: Secondary | ICD-10-CM | POA: Diagnosis not present

## 2016-11-22 DIAGNOSIS — D2271 Melanocytic nevi of right lower limb, including hip: Secondary | ICD-10-CM | POA: Diagnosis not present

## 2016-12-01 DIAGNOSIS — S7222XD Displaced subtrochanteric fracture of left femur, subsequent encounter for closed fracture with routine healing: Secondary | ICD-10-CM | POA: Diagnosis not present

## 2016-12-01 DIAGNOSIS — S72115D Nondisplaced fracture of greater trochanter of left femur, subsequent encounter for closed fracture with routine healing: Secondary | ICD-10-CM | POA: Diagnosis not present

## 2016-12-22 DIAGNOSIS — L989 Disorder of the skin and subcutaneous tissue, unspecified: Secondary | ICD-10-CM | POA: Diagnosis not present

## 2017-01-04 DIAGNOSIS — H9193 Unspecified hearing loss, bilateral: Secondary | ICD-10-CM | POA: Diagnosis not present

## 2017-01-04 DIAGNOSIS — H6503 Acute serous otitis media, bilateral: Secondary | ICD-10-CM | POA: Diagnosis not present

## 2017-01-04 DIAGNOSIS — H918X3 Other specified hearing loss, bilateral: Secondary | ICD-10-CM | POA: Diagnosis not present

## 2017-02-14 DIAGNOSIS — N401 Enlarged prostate with lower urinary tract symptoms: Secondary | ICD-10-CM | POA: Diagnosis not present

## 2017-02-14 DIAGNOSIS — R350 Frequency of micturition: Secondary | ICD-10-CM | POA: Diagnosis not present

## 2017-02-14 DIAGNOSIS — R972 Elevated prostate specific antigen [PSA]: Secondary | ICD-10-CM | POA: Diagnosis not present

## 2017-02-18 DIAGNOSIS — E7800 Pure hypercholesterolemia, unspecified: Secondary | ICD-10-CM | POA: Diagnosis not present

## 2017-02-23 DIAGNOSIS — E119 Type 2 diabetes mellitus without complications: Secondary | ICD-10-CM | POA: Diagnosis not present

## 2017-02-23 DIAGNOSIS — E039 Hypothyroidism, unspecified: Secondary | ICD-10-CM | POA: Diagnosis not present

## 2017-02-23 DIAGNOSIS — E7800 Pure hypercholesterolemia, unspecified: Secondary | ICD-10-CM | POA: Diagnosis not present

## 2017-02-23 DIAGNOSIS — I10 Essential (primary) hypertension: Secondary | ICD-10-CM | POA: Diagnosis not present

## 2017-06-29 IMAGING — DX DG ABD PORTABLE 1V
2 series · 2 of 2 positions shown · non-contrast
Comparison: 09/06/2016

CLINICAL DATA: Evaluate constipation.  Pain all over

EXAM:
PORTABLE ABDOMEN - 1 VIEW

[abdomen kub (1 of 2)]
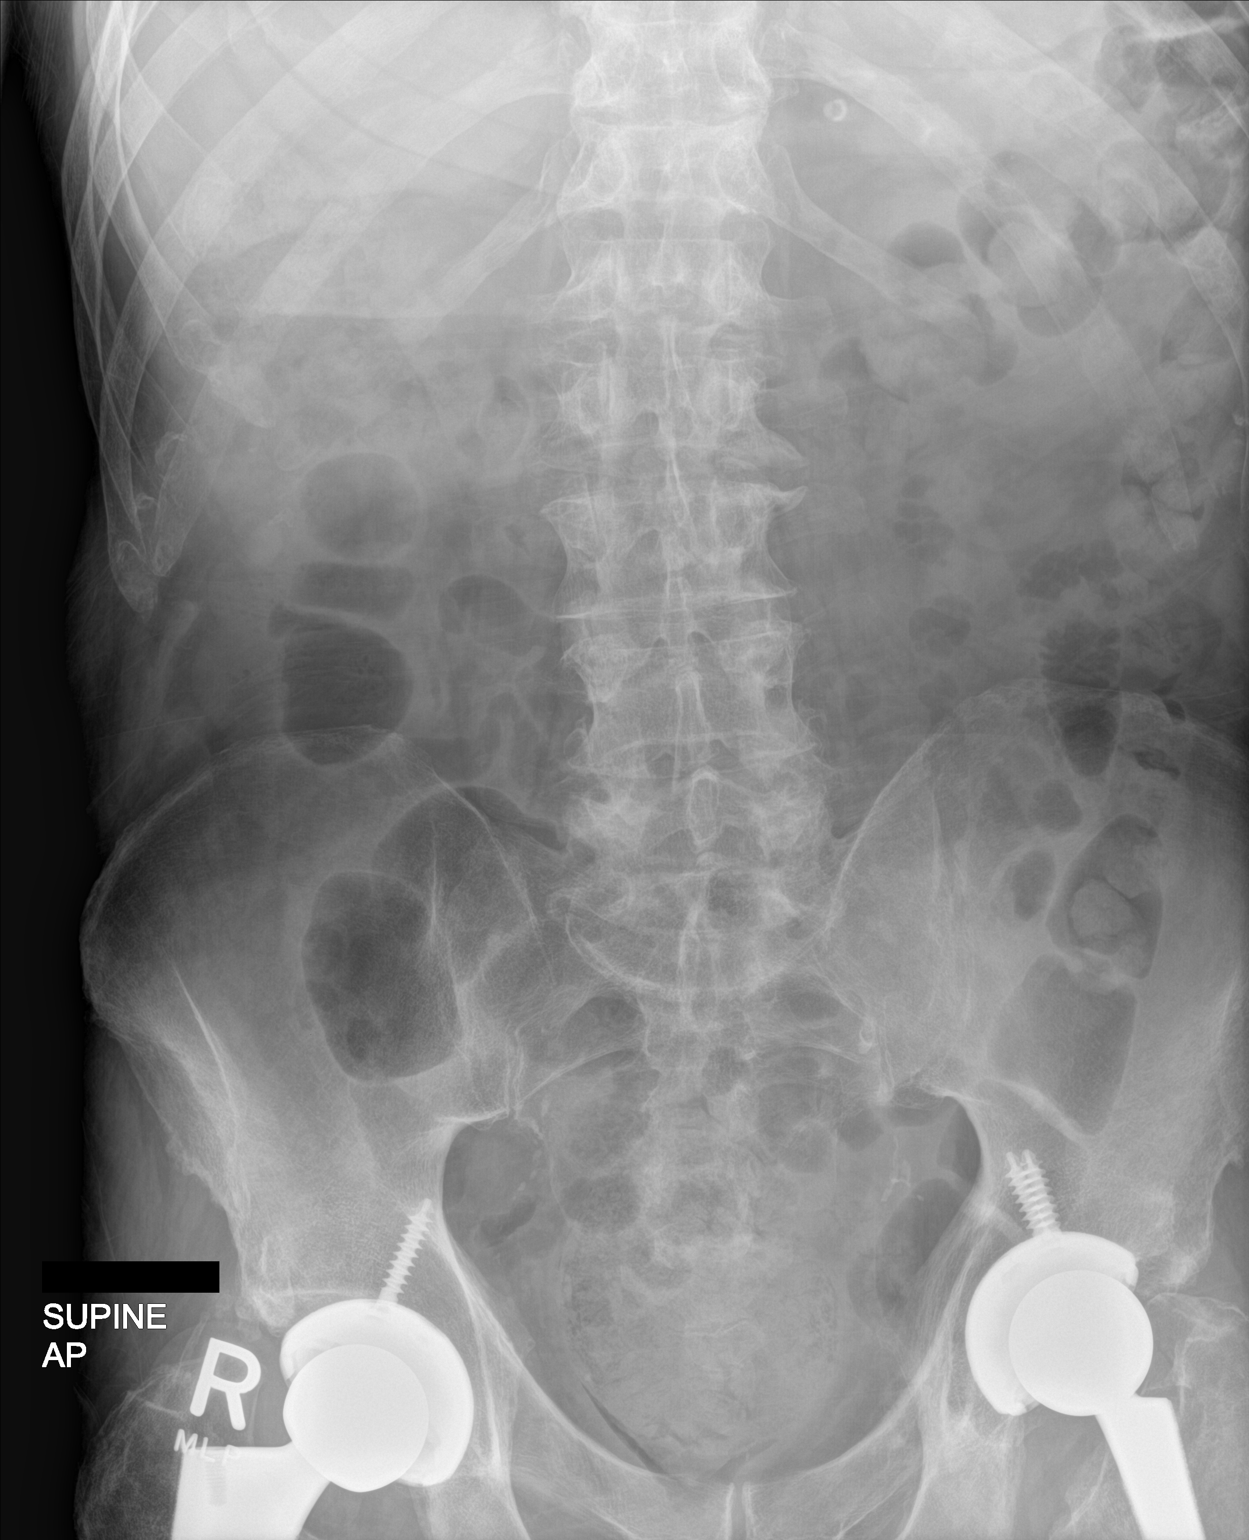

[abdomen kub (2 of 2)]
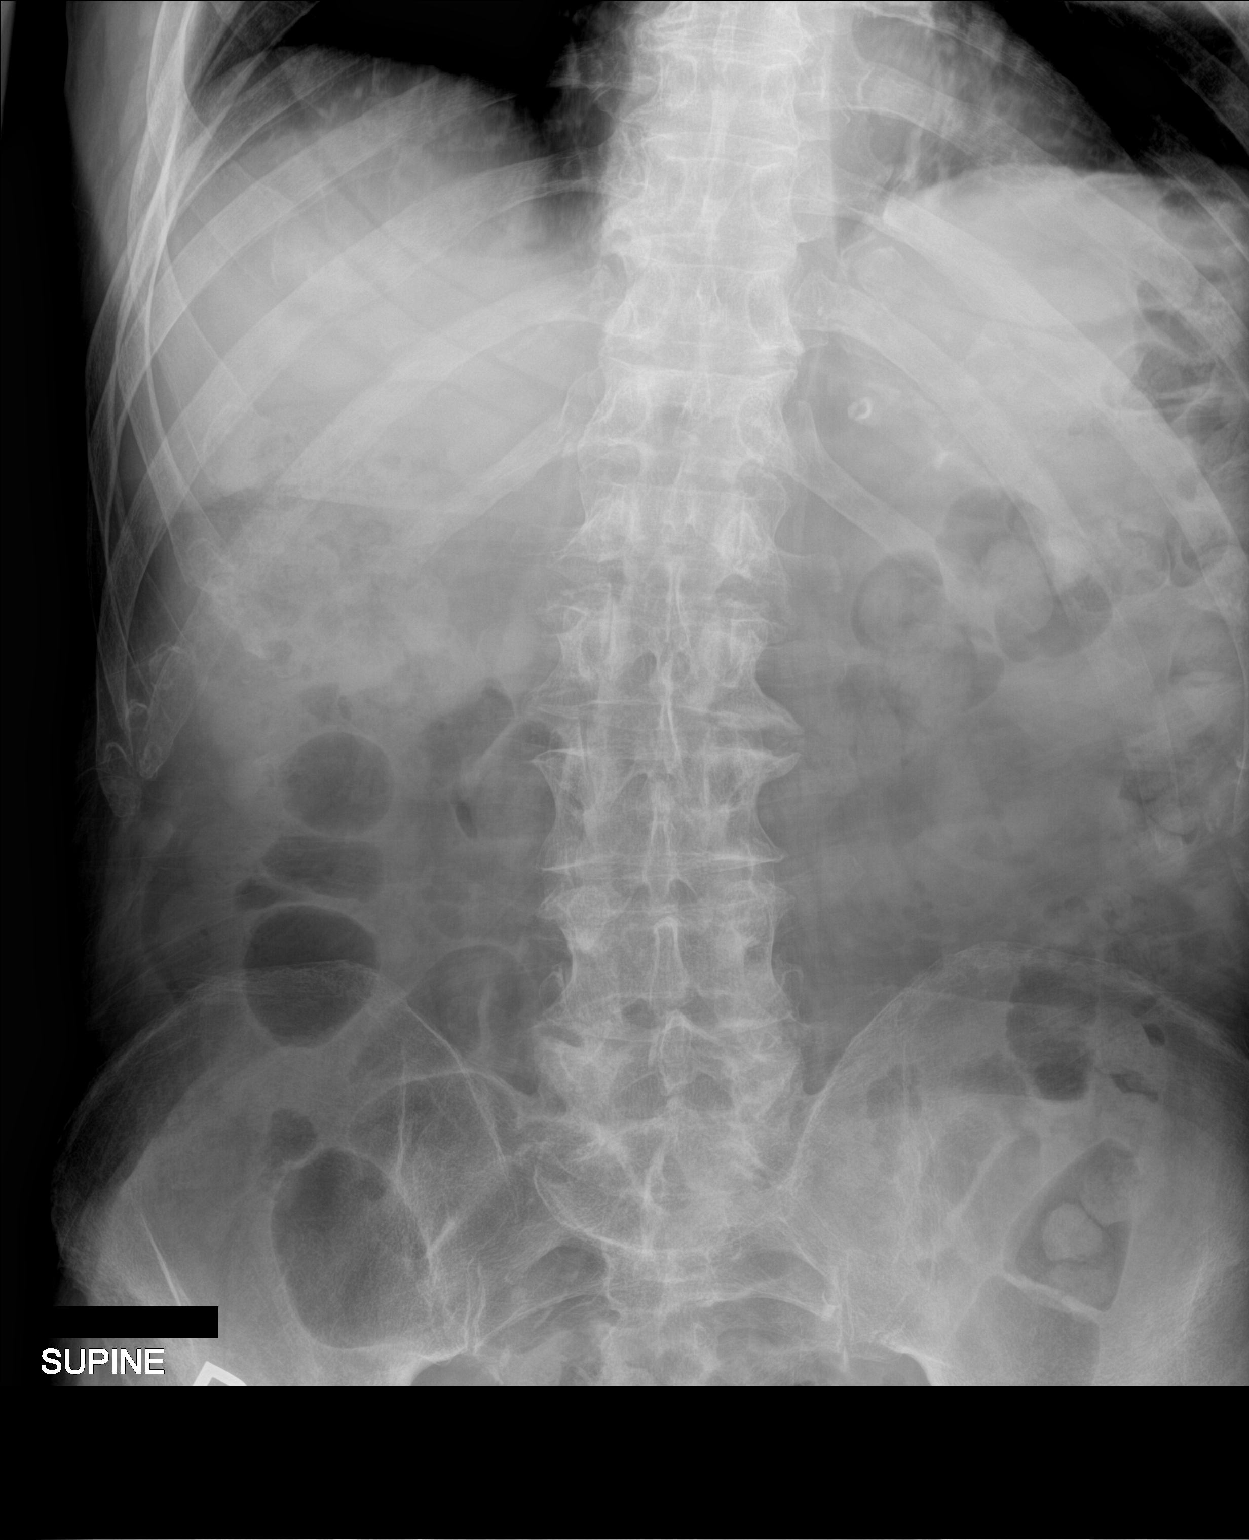

[2 of 2 positions shown; findings below may reference images not displayed]

FINDINGS: Moderate volume stool rectum. Minimal stool in the transverse and
descending colon. Mild decrease in volume compared to prior. No
dilated loops of large or small bowel. No organomegaly. No
pathologic calcification. Bilateral hip prosthetics noted.
IMPRESSION: Moderate volume stool rectum. Normal volume stool remaining colon.
Mild decrease in stool burden compared to prior.

## 2017-07-01 IMAGING — CR DG HIP (WITH OR WITHOUT PELVIS) 2-3V*L*
4 series · 4 of 4 positions shown · non-contrast
Comparison: CT scan of the left hip September 04, 2016

CLINICAL DATA: Status post fall this morning. Left hip pain. Known
acute close comminuted fracture of the intertrochanteric-sub
trochanteric left femur diagnosed on September 04, 2016.

EXAM:
DG HIP (WITH OR WITHOUT PELVIS) 2-3V LEFT

[x pelvis]
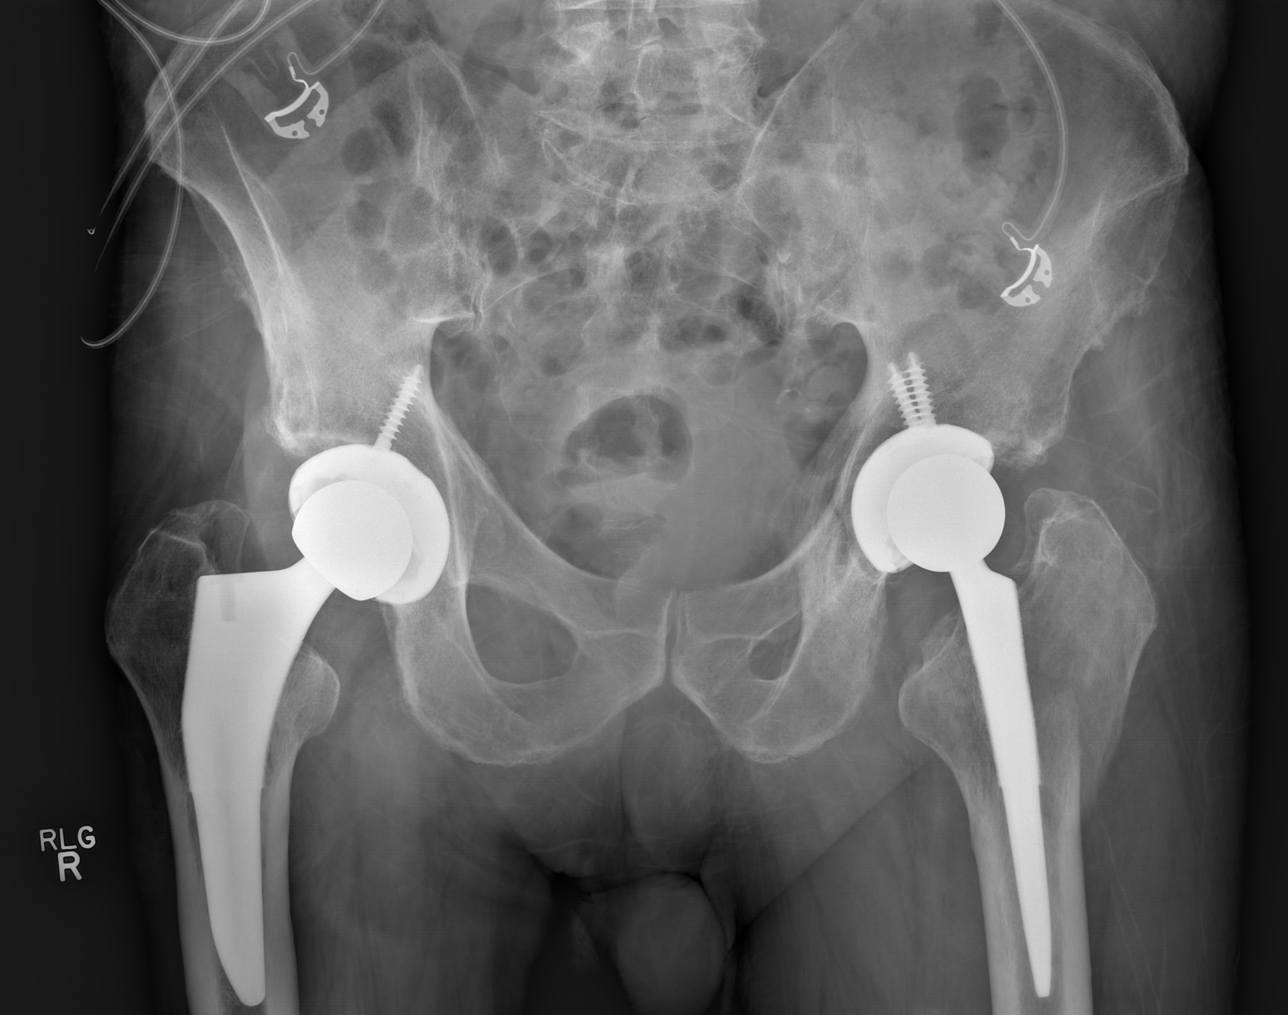

[x hip ap left]
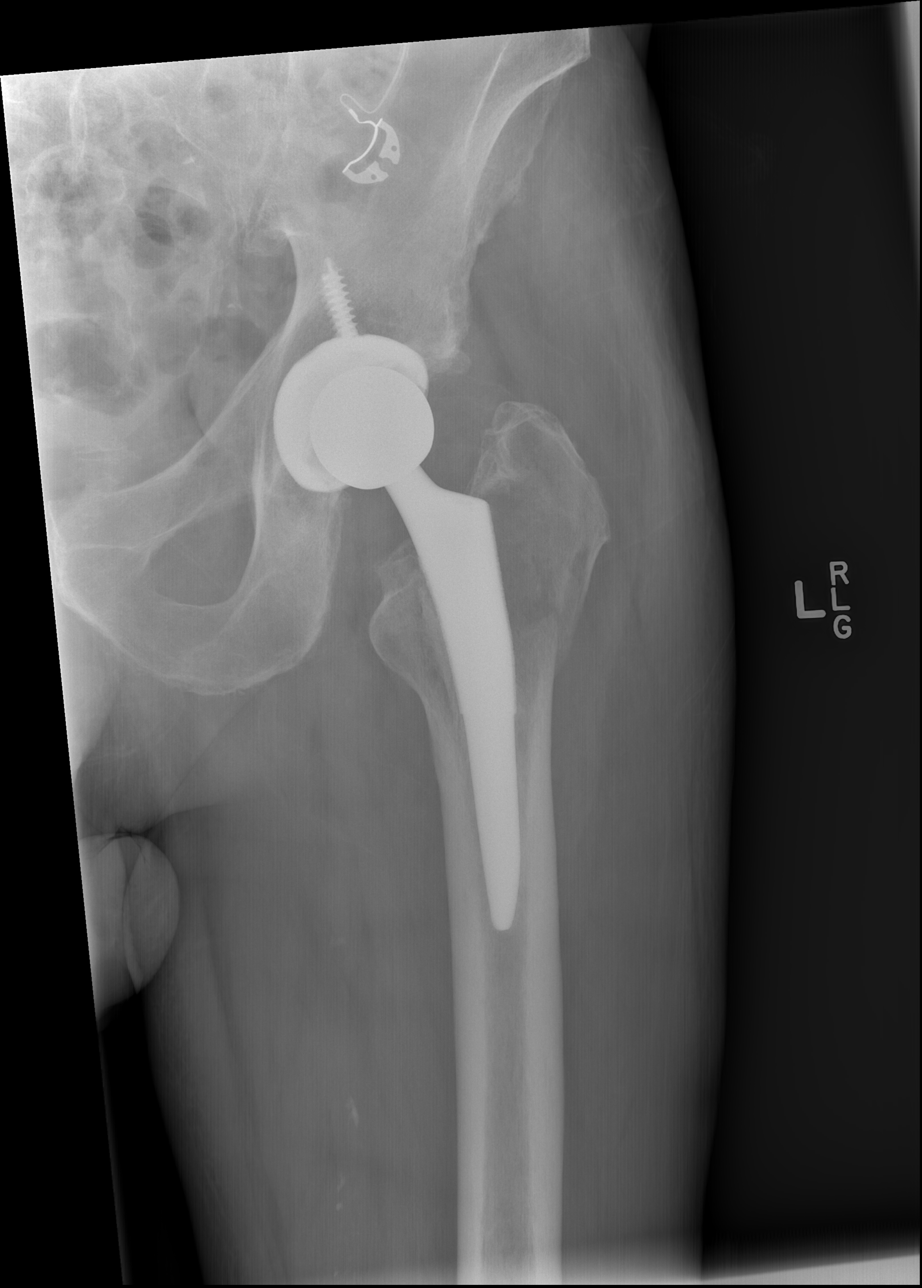

[w hip lat left (1 of 2)]
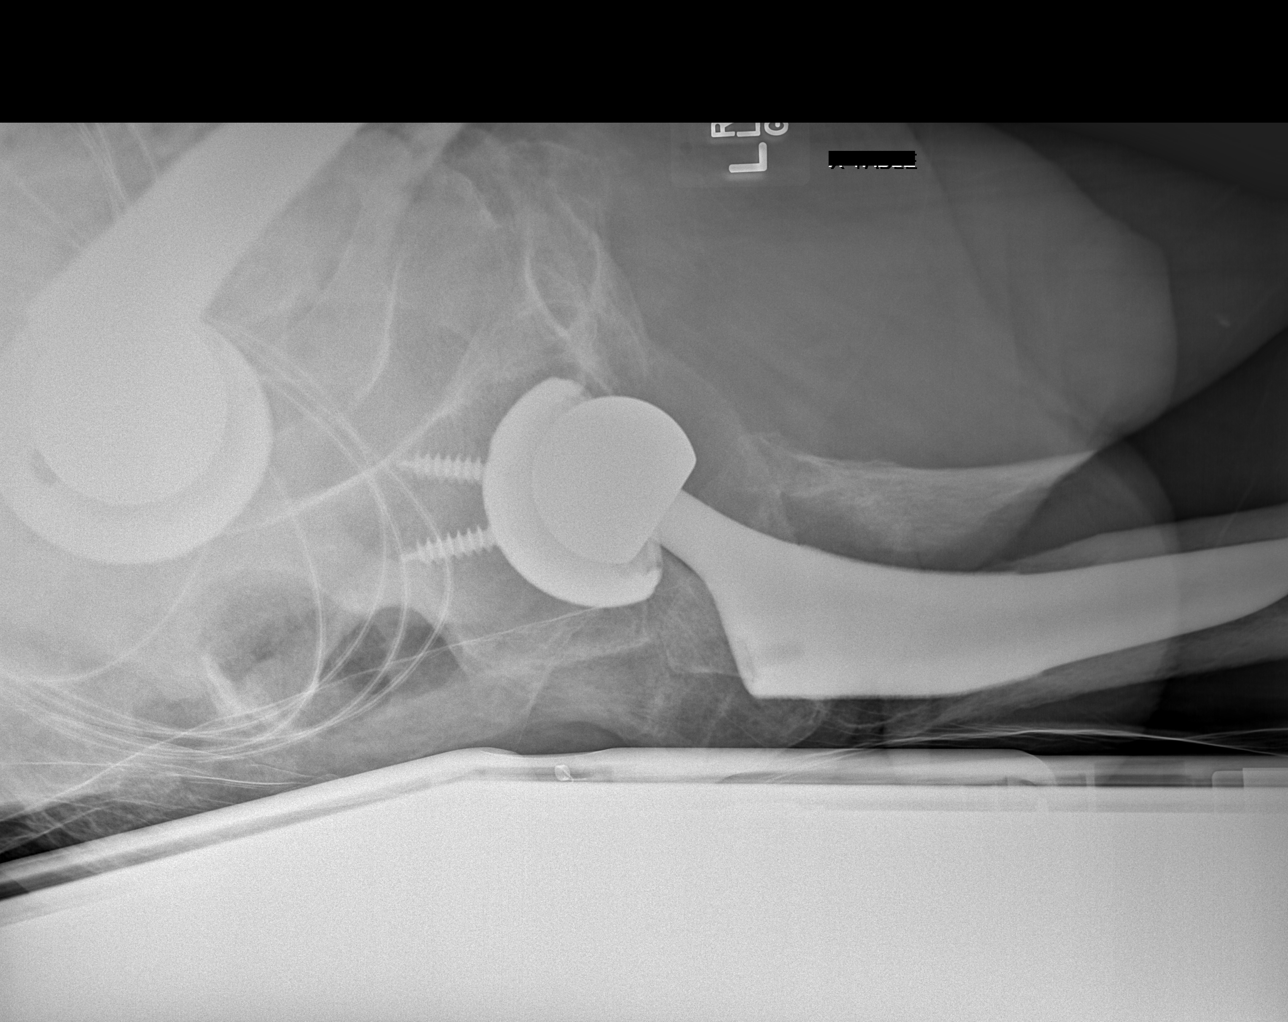

[w hip lat left (2 of 2)]
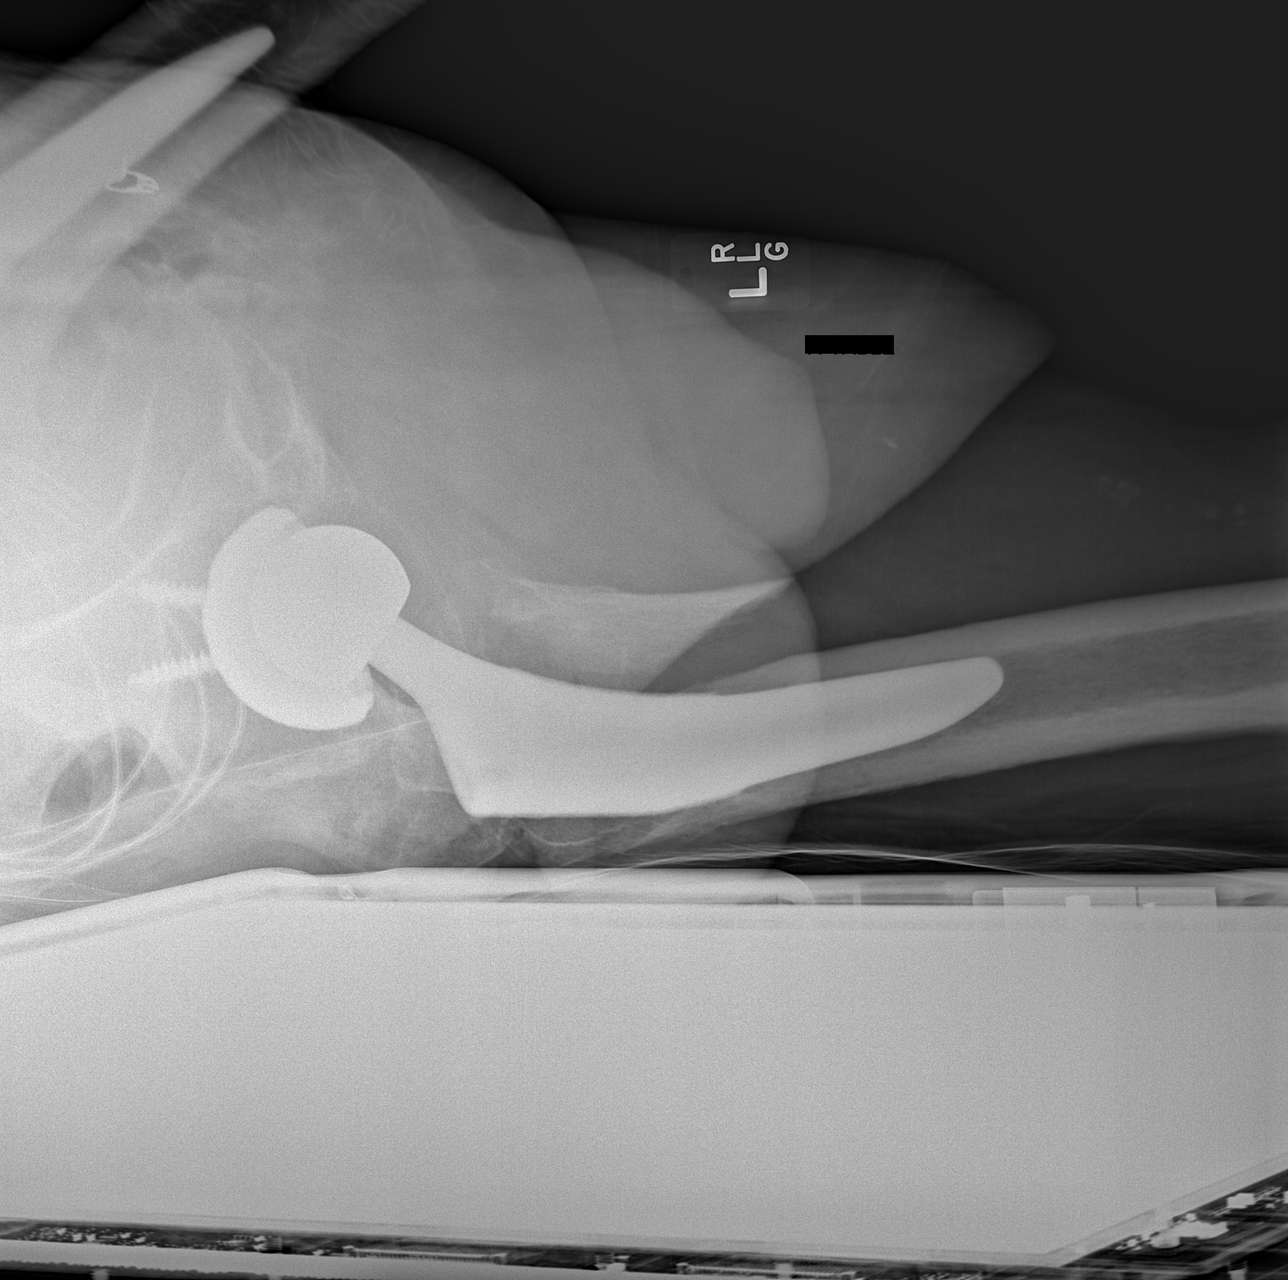

[4 of 4 positions shown; findings below may reference images not displayed]

FINDINGS: The fracture of the proximal left femur is again demonstrated. The
there is increased distraction of the greater trochanteric fracture
fragment as compared to the previous CT scan. The left hip joint
prosthesis remains in position. The observed portions of the left
hemipelvis appear normal.
IMPRESSION: Known fracture of the intertrochanteric -sub trochanteric region of
the left hip surrounding the femoral stem of the prosthesis. There
appears to be slightly more distraction of the greater trochanteric
fracture fragment on today's study.

## 2017-08-22 DIAGNOSIS — E039 Hypothyroidism, unspecified: Secondary | ICD-10-CM | POA: Diagnosis not present

## 2017-08-22 DIAGNOSIS — E559 Vitamin D deficiency, unspecified: Secondary | ICD-10-CM | POA: Diagnosis not present

## 2017-08-22 DIAGNOSIS — E119 Type 2 diabetes mellitus without complications: Secondary | ICD-10-CM | POA: Diagnosis not present

## 2017-08-22 DIAGNOSIS — I10 Essential (primary) hypertension: Secondary | ICD-10-CM | POA: Diagnosis not present

## 2017-08-22 DIAGNOSIS — E7800 Pure hypercholesterolemia, unspecified: Secondary | ICD-10-CM | POA: Diagnosis not present

## 2017-08-22 DIAGNOSIS — Z0000 Encounter for general adult medical examination without abnormal findings: Secondary | ICD-10-CM | POA: Diagnosis not present

## 2017-08-25 DIAGNOSIS — I10 Essential (primary) hypertension: Secondary | ICD-10-CM | POA: Diagnosis not present

## 2017-08-25 DIAGNOSIS — E785 Hyperlipidemia, unspecified: Secondary | ICD-10-CM | POA: Diagnosis not present

## 2017-08-25 DIAGNOSIS — Z8781 Personal history of (healed) traumatic fracture: Secondary | ICD-10-CM | POA: Diagnosis not present

## 2017-08-25 DIAGNOSIS — R1310 Dysphagia, unspecified: Secondary | ICD-10-CM | POA: Diagnosis not present

## 2017-08-25 DIAGNOSIS — E039 Hypothyroidism, unspecified: Secondary | ICD-10-CM | POA: Diagnosis not present

## 2017-08-25 DIAGNOSIS — E119 Type 2 diabetes mellitus without complications: Secondary | ICD-10-CM | POA: Diagnosis not present

## 2017-08-25 DIAGNOSIS — Z86718 Personal history of other venous thrombosis and embolism: Secondary | ICD-10-CM | POA: Diagnosis not present

## 2017-09-05 DIAGNOSIS — E785 Hyperlipidemia, unspecified: Secondary | ICD-10-CM | POA: Diagnosis not present

## 2017-09-05 DIAGNOSIS — R634 Abnormal weight loss: Secondary | ICD-10-CM | POA: Diagnosis not present

## 2017-09-05 DIAGNOSIS — R1310 Dysphagia, unspecified: Secondary | ICD-10-CM | POA: Diagnosis not present

## 2017-09-13 DIAGNOSIS — R1310 Dysphagia, unspecified: Secondary | ICD-10-CM | POA: Diagnosis not present

## 2017-09-13 DIAGNOSIS — K222 Esophageal obstruction: Secondary | ICD-10-CM | POA: Diagnosis not present

## 2017-09-13 DIAGNOSIS — R634 Abnormal weight loss: Secondary | ICD-10-CM | POA: Diagnosis not present

## 2017-10-31 DIAGNOSIS — K222 Esophageal obstruction: Secondary | ICD-10-CM | POA: Diagnosis not present

## 2017-10-31 DIAGNOSIS — R1310 Dysphagia, unspecified: Secondary | ICD-10-CM | POA: Diagnosis not present

## 2017-10-31 DIAGNOSIS — R634 Abnormal weight loss: Secondary | ICD-10-CM | POA: Diagnosis not present

## 2017-11-04 ENCOUNTER — Emergency Department (HOSPITAL_COMMUNITY)
Admission: EM | Admit: 2017-11-04 | Discharge: 2017-11-04 | Disposition: A | Discharge status: Home / Self Care | Payer: Medicare Other | Attending: Emergency Medicine | Admitting: Emergency Medicine

## 2017-11-04 ENCOUNTER — Other Ambulatory Visit: Payer: Self-pay

## 2017-11-04 ENCOUNTER — Emergency Department (HOSPITAL_COMMUNITY): Payer: Medicare Other

## 2017-11-04 DIAGNOSIS — Z96651 Presence of right artificial knee joint: Secondary | ICD-10-CM | POA: Insufficient documentation

## 2017-11-04 DIAGNOSIS — S098XXA Other specified injuries of head, initial encounter: Secondary | ICD-10-CM | POA: Diagnosis not present

## 2017-11-04 DIAGNOSIS — W19XXXA Unspecified fall, initial encounter: Secondary | ICD-10-CM

## 2017-11-04 DIAGNOSIS — Z79899 Other long term (current) drug therapy: Secondary | ICD-10-CM | POA: Insufficient documentation

## 2017-11-04 DIAGNOSIS — S4992XA Unspecified injury of left shoulder and upper arm, initial encounter: Secondary | ICD-10-CM | POA: Diagnosis not present

## 2017-11-04 DIAGNOSIS — I10 Essential (primary) hypertension: Secondary | ICD-10-CM | POA: Insufficient documentation

## 2017-11-04 DIAGNOSIS — Y999 Unspecified external cause status: Secondary | ICD-10-CM | POA: Insufficient documentation

## 2017-11-04 DIAGNOSIS — Y9301 Activity, walking, marching and hiking: Secondary | ICD-10-CM | POA: Diagnosis not present

## 2017-11-04 DIAGNOSIS — S0181XA Laceration without foreign body of other part of head, initial encounter: Secondary | ICD-10-CM | POA: Diagnosis not present

## 2017-11-04 DIAGNOSIS — S41112A Laceration without foreign body of left upper arm, initial encounter: Secondary | ICD-10-CM

## 2017-11-04 DIAGNOSIS — Z87891 Personal history of nicotine dependence: Secondary | ICD-10-CM | POA: Diagnosis not present

## 2017-11-04 DIAGNOSIS — W0110XA Fall on same level from slipping, tripping and stumbling with subsequent striking against unspecified object, initial encounter: Secondary | ICD-10-CM | POA: Diagnosis not present

## 2017-11-04 DIAGNOSIS — Z96643 Presence of artificial hip joint, bilateral: Secondary | ICD-10-CM | POA: Insufficient documentation

## 2017-11-04 DIAGNOSIS — Z23 Encounter for immunization: Secondary | ICD-10-CM | POA: Insufficient documentation

## 2017-11-04 DIAGNOSIS — E039 Hypothyroidism, unspecified: Secondary | ICD-10-CM | POA: Diagnosis not present

## 2017-11-04 DIAGNOSIS — Y929 Unspecified place or not applicable: Secondary | ICD-10-CM | POA: Diagnosis not present

## 2017-11-04 DIAGNOSIS — S46122A Laceration of muscle, fascia and tendon of long head of biceps, left arm, initial encounter: Secondary | ICD-10-CM | POA: Diagnosis not present

## 2017-11-04 MED ORDER — LIDOCAINE HCL 2 % IJ SOLN
20.0000 mL | Freq: Once | INTRAMUSCULAR | Status: AC
  Administered 2017-11-04: 400 mg
  Filled 2017-11-04: qty 20

## 2017-11-04 MED ORDER — LIDOCAINE-EPINEPHRINE (PF) 2 %-1:200000 IJ SOLN
INTRAMUSCULAR | Status: AC
  Filled 2017-11-04: qty 20

## 2017-11-04 MED ORDER — TETANUS-DIPHTH-ACELL PERTUSSIS 5-2.5-18.5 LF-MCG/0.5 IM SUSP
0.5000 mL | Freq: Once | INTRAMUSCULAR | Status: AC
  Administered 2017-11-04: 0.5 mL via INTRAMUSCULAR
  Filled 2017-11-04: qty 0.5

## 2017-11-04 MED ORDER — AMOXICILLIN-POT CLAVULANATE 875-125 MG PO TABS: 1.0000 | ORAL_TABLET | Freq: Two times a day (BID) | ORAL | 0 refills | Status: DC

## 2017-11-04 MED ORDER — TRAMADOL HCL 50 MG PO TABS: 50.0000 mg | ORAL_TABLET | Freq: Four times a day (QID) | ORAL | 0 refills | Status: DC | PRN

## 2017-11-04 MED ORDER — HYDROCODONE-ACETAMINOPHEN 5-325 MG PO TABS
1.0000 | ORAL_TABLET | Freq: Once | ORAL | Status: AC
Start: 2017-11-04 — End: 2017-11-04
  Administered 2017-11-04: 1 via ORAL
  Filled 2017-11-04: qty 1

## 2017-11-04 NOTE — Discharge Instructions (Addendum)
Return here as needed. Follow up with your doctor for suture removal. Keep the area clean and dry. Return here for any signs of infection

## 2017-11-04 NOTE — ED Notes (Signed)
Bed: WA21 Expected date:  Expected time:  Means of arrival:  Comments: 84 yo fall, arm injury

## 2017-11-04 NOTE — ED Triage Notes (Signed)
Per EMS: Pt was outside push mowing. Pt tripped and his his chin. Pt denies LOC. PT is on blood thinners, but bleeding is controlled at this time.  Left arm pain reported from pt.  Pt reports low pain levels.  Pt reports he has some weakness after the fall, but states that is baseline for him.  BP 126/62 HR 65 CBG 140 NS on monitor.  AxO x4.

## 2017-11-05 NOTE — ED Provider Notes (Signed)
[facility] DEPT Provider Note   CSN: [id number] Arrival date & time: 11/04/17  1538     History   Chief Complaint Chief Complaint  Patient presents with  . Arm Pain    HPI Gene Hunter is a 84 y.o. male.  HPI  Patient presents to the emergency department with injuries following a fall.  Patient states that he was mowing grass when he was mowing on a hill side and fell forward landing over the handlebar of the push mower.  The patient states that he cut his arm on a piece of the handlebar.  And then cut his chin against the top of the motor of the engine.  Patient states he did not lose consciousness.  He states that he did not hit his head other than his chin.  Patient states that he has no other injuries.  He states he was able to walk after the accident.  Patient states tha he is not have any headache, blurred vision, weakness, numbness, dizziness, back pain, neck pain, shortness of breath, nausea, vomiting or syncope.  All other systems negative except as documented in the HPI. All pertinent positives and negatives as reviewed in the HPI. Past Medical History:  Diagnosis Date  . Blindness of right eye   . BPH (benign prostatic hyperplasia)   . Essential hypertension   . HLD (hyperlipidemia)   . Hypothyroidism     Patient Active Problem List   Diagnosis Date Noted  . Closed fracture of left hip ([place]) 09/04/2016  . Constipation 09/04/2016  . Fall 09/04/2016  . BPH (benign prostatic hyperplasia) 09/04/2016  . Essential hypertension   . HLD (hyperlipidemia)   . Hypothyroidism     Past Surgical History:  Procedure Laterality Date  . bilateral hip replacement    . HERNIA REPAIR    . JOINT REPLACEMENT    . right knee replacement          Home Medications    Prior to Admission medications   Medication Sig Start Date End Date Taking? Authorizing Provider  atorvastatin (LIPITOR) 20 MG tablet Take 20 mg by mouth daily.   Yes [provider]  calcium carbonate (OSCAL) 1500 (600 Ca) MG TABS tablet Take 600 mg of elemental calcium by mouth daily with breakfast.   Yes [provider]  cholecalciferol (VITAMIN D) 1000 units tablet Take 1,000 Units by mouth daily.   Yes [provider]  levothyroxine (LEVOXYL) 100 MCG tablet Take 100 mcg by mouth daily before breakfast.    Yes [provider]  lisinopril (PRINIVIL,ZESTRIL) 10 MG tablet Take 10 mg by mouth daily.   Yes [provider]  Multiple Vitamins-Minerals (MULTIVITAMIN WITH MINERALS) tablet Take 1 tablet by mouth daily.   Yes [provider]  acetaminophen (TYLENOL) 325 MG tablet Take 2 tablets (650 mg total) by mouth every 6 (six) hours as needed for mild pain, moderate pain, fever or headache. Patient not taking: Reported on 11/04/2017 09/08/16   Modena Jansky, MD  amoxicillin-clavulanate (AUGMENTIN) 875-125 MG tablet Take 1 tablet by mouth every 12 (twelve) hours. 11/04/17   Mindi Akerson, Harrell Gave, PA-C  docusate sodium (COLACE) 100 MG capsule Take 1 capsule (100 mg total) by mouth 2 (two) times daily. Patient not taking: Reported on 11/04/2017 10/21/16   Ocie Cornfield T, PA-C  polyethylene glycol (MIRALAX / GLYCOLAX) packet Take 17 g by mouth daily. Patient not taking: Reported on 11/04/2017 09/09/16   Modena Jansky, MD  senna-docusate (SENOKOT-S) 8.6-50 MG tablet Take 1 tablet by mouth 2 (two) times daily. Patient not taking: Reported on 11/04/2017 09/08/16   Modena Jansky, MD  traMADol (ULTRAM) 50 MG tablet Take 1 tablet (50 mg total) by mouth every 6 (six) hours as needed for severe pain. 11/04/17   Dalia Heading, PA-C    Family History Family History  Problem Relation Age of Onset  . Heart disease Father     Social History Social History   Tobacco Use  . Smoking status: Former Research scientist (life sciences)  . Smokeless tobacco: Never Used  Substance Use Topics  . Alcohol use: No  . Drug use: No     Allergies   Bee  venom and Milk-related compounds   Review of Systems Review of Systems All other systems negative except as documented in the HPI. All pertinent positives and negatives as reviewed in the HPI.  Physical Exam Updated Vital Signs BP (!) 143/71 (BP Location: Right Arm)   Pulse 60   Temp 98.6 F (37 C) (Oral)   Resp 20   Ht 6' (1.829 m)   Wt 68 kg (150 lb)   SpO2 99%   BMI 20.34 kg/m   Physical Exam  Constitutional: He is oriented to person, place, and time. He appears well-developed and well-nourished. No distress.  HENT:  Head: Normocephalic.    Mouth/Throat: Oropharynx is clear and moist.  Eyes: Pupils are equal, round, and reactive to light.  Neck: Normal range of motion. Neck supple.  Cardiovascular: Normal rate, regular rhythm and normal heart sounds. Exam reveals no gallop and no friction rub.  No murmur heard. Pulmonary/Chest: Effort normal and breath sounds normal. No respiratory distress. He has no wheezes.  Musculoskeletal:       Arms: Neurological: He is alert and oriented to person, place, and time. He exhibits normal muscle tone. Coordination normal.  Skin: Skin is warm and dry. Capillary refill takes less than 2 seconds. No rash noted. No erythema.  Psychiatric: He has a normal mood and affect. His behavior is normal.  Nursing note and vitals reviewed.    ED Treatments / Results  Labs (all labs ordered are listed, but only abnormal results are displayed) Labs Reviewed - No data to display  EKG None  Radiology Dg Humerus Left  Result Date: 11/04/2017 CLINICAL DATA:  Status post fall EXAM: LEFT HUMERUS - 2+ VIEW COMPARISON:  None. FINDINGS: There is no evidence of fracture or other focal bone lesions. There is a soft tissue laceration along the volar aspect of the distal arm. IMPRESSION: No acute osseous injury of the left humerus. Electronically Signed   By: Kathreen Devoid   On: 11/04/2017 17:17    Procedures Procedures (including critical care  time)  Medications Ordered in ED Medications  lidocaine-EPINEPHrine (XYLOCAINE W/EPI) 2 %-1:200000 (PF) injection (  Not Given 11/04/17 2032)  HYDROcodone-acetaminophen (NORCO/VICODIN) 5-325 MG per tablet 1 tablet (1 tablet Oral Given 11/04/17 1707)  Tdap (BOOSTRIX) injection 0.5 mL (0.5 mLs Intramuscular Given 11/04/17 1830)  lidocaine (XYLOCAINE) 2 % (with pres) injection 400 mg (400 mg Infiltration Given by Other 11/04/17 2031)     Initial Impression / Assessment and Plan / ED Course  I have reviewed the triage vital signs and the nursing notes.  Pertinent labs & imaging results that were available during my care of the patient were reviewed by me and considered in my medical decision making (see chart for details).     LACERATION REPAIR Performed by: Resa Miner  Murray Authorized by: Brent General Consent: Verbal consent obtained. Risks and benefits: risks, benefits and alternatives were discussed Consent given by: patient Patient identity confirmed: provided demographic data Prepped and Draped in normal sterile fashion Wound explored  Laceration Location: Mid bicep region L arm  Laceration Length: 9 cm  No Foreign Bodies seen or palpated  Anesthesia: local infiltration  Local anesthetic: lidocaine 2% wo epinephrine  Anesthetic total:6 ml  Irrigation method: syringe Amount of cleaning: standard  Skin closure: 3 layer closure. Closed the fascia of the muscle and the subcutaneous tissue and cutaneous layers.   Number of sutures: 6 fascial sutures 8 subcutaneous sutures 9 simple interrupted the top portion of the wound is a skin tear and I used Dermabond and Steri-Strips.  Technique:Simple interrupted  Patient tolerance: Patient tolerated the procedure well with no immediate complications.  Patient is advised to watch for signs of infection.  Told return here as needed.  I have advised the patient to follow-up with his primary doctor for suture removal.  Have  also advised him to keep the area clean and dry.  The top portion of the wound to the skin tear and I used Dermabond and Final Clinical Impressions(s) / ED Diagnoses   Final diagnoses:  Laceration of arm, left, complicated, initial encounter  Fall, initial encounter  Chin laceration, initial encounter    ED Discharge Orders        Ordered    amoxicillin-clavulanate (AUGMENTIN) 875-125 MG tablet  Every 12 hours     11/04/17 2211    traMADol (ULTRAM) 50 MG tablet  Every 6 hours PRN     11/04/17 2211       Dalia Heading, PA-C 11/05/17 0016    Drenda Freeze, MD 11/05/17 [phone]

## 2017-11-18 DIAGNOSIS — S0181XA Laceration without foreign body of other part of head, initial encounter: Secondary | ICD-10-CM | POA: Diagnosis not present

## 2017-11-18 DIAGNOSIS — S41112A Laceration without foreign body of left upper arm, initial encounter: Secondary | ICD-10-CM | POA: Diagnosis not present

## 2017-11-21 ENCOUNTER — Encounter (HOSPITAL_COMMUNITY): Payer: Self-pay | Admitting: Gastroenterology

## 2017-11-21 ENCOUNTER — Encounter (HOSPITAL_COMMUNITY)
Admission: RE | Disposition: A | Discharge status: Home / Self Care | Payer: Self-pay | Source: Ambulatory Visit | Attending: Gastroenterology

## 2017-11-21 ENCOUNTER — Ambulatory Visit (HOSPITAL_COMMUNITY)
Admission: RE | Admit: 2017-11-21 | Discharge: 2017-11-21 | Disposition: A | Discharge status: Home / Self Care | Payer: Medicare Other | Source: Ambulatory Visit | Attending: Gastroenterology | Admitting: Gastroenterology

## 2017-11-21 DIAGNOSIS — R1310 Dysphagia, unspecified: Secondary | ICD-10-CM | POA: Diagnosis not present

## 2017-11-21 HISTORY — PX: ESOPHAGEAL MANOMETRY: SHX5429

## 2017-11-21 SURGERY — MANOMETRY, ESOPHAGUS

## 2017-11-21 MED ORDER — LIDOCAINE VISCOUS 2 % MT SOLN
OROMUCOSAL | Status: AC
  Filled 2017-11-21: qty 15

## 2017-11-21 SURGICAL SUPPLY — 2 items
FACESHIELD LNG OPTICON STERILE (SAFETY) IMPLANT
GLOVE BIO SURGEON STRL SZ8 (GLOVE) ×6 IMPLANT

## 2017-11-21 NOTE — Progress Notes (Signed)
Esophageal Manometry done per protocol. Carmie End RN at bedside to help support patient during insertion. Pt tolerated well no complications or problems noted. Dr. Benson Norway to read report.

## 2017-12-19 DIAGNOSIS — I10 Essential (primary) hypertension: Secondary | ICD-10-CM | POA: Diagnosis not present

## 2017-12-19 DIAGNOSIS — E119 Type 2 diabetes mellitus without complications: Secondary | ICD-10-CM | POA: Diagnosis not present

## 2017-12-19 DIAGNOSIS — R634 Abnormal weight loss: Secondary | ICD-10-CM | POA: Diagnosis not present

## 2017-12-19 DIAGNOSIS — H9190 Unspecified hearing loss, unspecified ear: Secondary | ICD-10-CM | POA: Diagnosis not present

## 2017-12-19 DIAGNOSIS — R636 Underweight: Secondary | ICD-10-CM | POA: Diagnosis not present

## 2017-12-28 DIAGNOSIS — R634 Abnormal weight loss: Secondary | ICD-10-CM | POA: Diagnosis not present

## 2017-12-28 DIAGNOSIS — R1310 Dysphagia, unspecified: Secondary | ICD-10-CM | POA: Diagnosis not present

## 2018-01-09 DIAGNOSIS — L821 Other seborrheic keratosis: Secondary | ICD-10-CM | POA: Diagnosis not present

## 2018-01-09 DIAGNOSIS — L720 Epidermal cyst: Secondary | ICD-10-CM | POA: Diagnosis not present

## 2018-01-09 DIAGNOSIS — D225 Melanocytic nevi of trunk: Secondary | ICD-10-CM | POA: Diagnosis not present

## 2018-01-09 DIAGNOSIS — L905 Scar conditions and fibrosis of skin: Secondary | ICD-10-CM | POA: Diagnosis not present

## 2018-01-09 DIAGNOSIS — D2271 Melanocytic nevi of right lower limb, including hip: Secondary | ICD-10-CM | POA: Diagnosis not present

## 2018-02-22 DIAGNOSIS — E785 Hyperlipidemia, unspecified: Secondary | ICD-10-CM | POA: Diagnosis not present

## 2018-02-22 DIAGNOSIS — E119 Type 2 diabetes mellitus without complications: Secondary | ICD-10-CM | POA: Diagnosis not present

## 2018-02-27 DIAGNOSIS — E039 Hypothyroidism, unspecified: Secondary | ICD-10-CM | POA: Diagnosis not present

## 2018-02-27 DIAGNOSIS — E119 Type 2 diabetes mellitus without complications: Secondary | ICD-10-CM | POA: Diagnosis not present

## 2018-02-27 DIAGNOSIS — E7800 Pure hypercholesterolemia, unspecified: Secondary | ICD-10-CM | POA: Diagnosis not present

## 2018-02-27 DIAGNOSIS — R3912 Poor urinary stream: Secondary | ICD-10-CM | POA: Diagnosis not present

## 2018-02-27 DIAGNOSIS — I10 Essential (primary) hypertension: Secondary | ICD-10-CM | POA: Diagnosis not present

## 2018-02-27 DIAGNOSIS — N401 Enlarged prostate with lower urinary tract symptoms: Secondary | ICD-10-CM | POA: Diagnosis not present

## 2018-03-29 DIAGNOSIS — R1310 Dysphagia, unspecified: Secondary | ICD-10-CM | POA: Diagnosis not present

## 2018-03-29 DIAGNOSIS — R634 Abnormal weight loss: Secondary | ICD-10-CM | POA: Diagnosis not present

## 2018-06-01 DIAGNOSIS — R310 Gross hematuria: Secondary | ICD-10-CM | POA: Diagnosis not present

## 2018-06-01 DIAGNOSIS — N390 Urinary tract infection, site not specified: Secondary | ICD-10-CM | POA: Diagnosis not present

## 2018-06-01 DIAGNOSIS — R350 Frequency of micturition: Secondary | ICD-10-CM | POA: Diagnosis not present

## 2018-06-02 DIAGNOSIS — R310 Gross hematuria: Secondary | ICD-10-CM | POA: Diagnosis not present

## 2018-06-09 DIAGNOSIS — R310 Gross hematuria: Secondary | ICD-10-CM | POA: Diagnosis not present

## 2018-07-10 DIAGNOSIS — R310 Gross hematuria: Secondary | ICD-10-CM | POA: Diagnosis not present

## 2018-07-10 DIAGNOSIS — N401 Enlarged prostate with lower urinary tract symptoms: Secondary | ICD-10-CM | POA: Diagnosis not present

## 2018-07-10 DIAGNOSIS — R350 Frequency of micturition: Secondary | ICD-10-CM | POA: Diagnosis not present

## 2018-08-23 DIAGNOSIS — E119 Type 2 diabetes mellitus without complications: Secondary | ICD-10-CM | POA: Diagnosis not present

## 2018-08-23 DIAGNOSIS — E039 Hypothyroidism, unspecified: Secondary | ICD-10-CM | POA: Diagnosis not present

## 2018-08-23 DIAGNOSIS — E559 Vitamin D deficiency, unspecified: Secondary | ICD-10-CM | POA: Diagnosis not present

## 2018-08-23 DIAGNOSIS — I10 Essential (primary) hypertension: Secondary | ICD-10-CM | POA: Diagnosis not present

## 2018-08-23 DIAGNOSIS — E785 Hyperlipidemia, unspecified: Secondary | ICD-10-CM | POA: Diagnosis not present

## 2018-08-28 DIAGNOSIS — E119 Type 2 diabetes mellitus without complications: Secondary | ICD-10-CM | POA: Diagnosis not present

## 2018-08-28 DIAGNOSIS — Z0000 Encounter for general adult medical examination without abnormal findings: Secondary | ICD-10-CM | POA: Diagnosis not present

## 2018-08-28 DIAGNOSIS — E7800 Pure hypercholesterolemia, unspecified: Secondary | ICD-10-CM | POA: Diagnosis not present

## 2018-08-28 DIAGNOSIS — E039 Hypothyroidism, unspecified: Secondary | ICD-10-CM | POA: Diagnosis not present

## 2018-08-28 DIAGNOSIS — I10 Essential (primary) hypertension: Secondary | ICD-10-CM | POA: Diagnosis not present

## 2018-10-08 ENCOUNTER — Other Ambulatory Visit: Payer: Self-pay

## 2018-10-08 ENCOUNTER — Observation Stay (HOSPITAL_COMMUNITY)
Admission: EM | Admit: 2018-10-08 | Discharge: 2018-10-09 | Disposition: A | Discharge status: Home / Self Care | Payer: Medicare Other | Attending: Student | Admitting: Student

## 2018-10-08 ENCOUNTER — Encounter (HOSPITAL_COMMUNITY): Payer: Self-pay | Admitting: Emergency Medicine

## 2018-10-08 ENCOUNTER — Emergency Department (HOSPITAL_COMMUNITY): Payer: Medicare Other

## 2018-10-08 DIAGNOSIS — I4891 Unspecified atrial fibrillation: Principal | ICD-10-CM | POA: Insufficient documentation

## 2018-10-08 DIAGNOSIS — E785 Hyperlipidemia, unspecified: Secondary | ICD-10-CM | POA: Insufficient documentation

## 2018-10-08 DIAGNOSIS — E039 Hypothyroidism, unspecified: Secondary | ICD-10-CM | POA: Insufficient documentation

## 2018-10-08 DIAGNOSIS — Z86718 Personal history of other venous thrombosis and embolism: Secondary | ICD-10-CM | POA: Diagnosis not present

## 2018-10-08 DIAGNOSIS — Z79890 Hormone replacement therapy (postmenopausal): Secondary | ICD-10-CM | POA: Diagnosis not present

## 2018-10-08 DIAGNOSIS — Z79899 Other long term (current) drug therapy: Secondary | ICD-10-CM | POA: Insufficient documentation

## 2018-10-08 DIAGNOSIS — I10 Essential (primary) hypertension: Secondary | ICD-10-CM | POA: Diagnosis not present

## 2018-10-08 DIAGNOSIS — Z87891 Personal history of nicotine dependence: Secondary | ICD-10-CM | POA: Insufficient documentation

## 2018-10-08 DIAGNOSIS — H5461 Unqualified visual loss, right eye, normal vision left eye: Secondary | ICD-10-CM | POA: Insufficient documentation

## 2018-10-08 DIAGNOSIS — Z66 Do not resuscitate: Secondary | ICD-10-CM | POA: Insufficient documentation

## 2018-10-08 DIAGNOSIS — Z8249 Family history of ischemic heart disease and other diseases of the circulatory system: Secondary | ICD-10-CM | POA: Insufficient documentation

## 2018-10-08 DIAGNOSIS — Z888 Allergy status to other drugs, medicaments and biological substances status: Secondary | ICD-10-CM | POA: Diagnosis not present

## 2018-10-08 DIAGNOSIS — N400 Enlarged prostate without lower urinary tract symptoms: Secondary | ICD-10-CM | POA: Insufficient documentation

## 2018-10-08 DIAGNOSIS — R946 Abnormal results of thyroid function studies: Secondary | ICD-10-CM | POA: Diagnosis not present

## 2018-10-08 DIAGNOSIS — R531 Weakness: Secondary | ICD-10-CM | POA: Diagnosis not present

## 2018-10-08 DIAGNOSIS — R262 Difficulty in walking, not elsewhere classified: Secondary | ICD-10-CM | POA: Diagnosis not present

## 2018-10-08 LAB — BASIC METABOLIC PANEL
Anion gap: 7 (ref 5–15)
BUN: 27 mg/dL — ABNORMAL HIGH (ref 8–23)
CO2: 25 mmol/L (ref 22–32)
Calcium: 9.2 mg/dL (ref 8.9–10.3)
Chloride: 107 mmol/L (ref 98–111)
Creatinine, Ser: 0.98 mg/dL (ref 0.61–1.24)
GFR calc Af Amer: >60 mL/min (ref >60)
Glucose, Bld: 122 mg/dL — ABNORMAL HIGH (ref 70–99)
Potassium: 4.4 mmol/L (ref 3.5–5.1)
Sodium: 139 mmol/L (ref 135–145)

## 2018-10-08 LAB — URINALYSIS, ROUTINE W REFLEX MICROSCOPIC
Bilirubin Urine: NEGATIVE
Glucose, UA: NEGATIVE mg/dL
Hgb urine dipstick: NEGATIVE
Ketones, ur: 20 mg/dL — AB
Leukocytes,Ua: NEGATIVE
Nitrite: NEGATIVE
Protein, ur: NEGATIVE mg/dL
Specific Gravity, Urine: 1.024 (ref 1.005–1.030)
pH: 5 (ref 5.0–8.0)

## 2018-10-08 LAB — CBC
HCT: 42.9 % (ref 39.0–52.0)
Hemoglobin: 13.8 g/dL (ref 13.0–17.0)
MCH: 31.3 pg (ref 26.0–34.0)
MCHC: 32.2 g/dL (ref 30.0–36.0)
MCV: 97.3 fL (ref 80.0–100.0)
Platelets: 172 10*3/uL (ref 150–400)
RBC: 4.41 MIL/uL (ref 4.22–5.81)
RDW: 13.3 % (ref 11.5–15.5)
WBC: 7.2 10*3/uL (ref 4.0–10.5)
nRBC: 0 % (ref 0.0–0.2)

## 2018-10-08 LAB — APTT: APTT: 23 s — AB (ref 24–36)

## 2018-10-08 LAB — PROTIME-INR
INR: 1 (ref 0.8–1.2)
PROTHROMBIN TIME: 12.8 s (ref 11.4–15.2)

## 2018-10-08 LAB — CBG MONITORING, ED: Glucose-Capillary: 114 mg/dL — ABNORMAL HIGH (ref 70–99)

## 2018-10-08 LAB — BRAIN NATRIURETIC PEPTIDE: B Natriuretic Peptide: 281.5 pg/mL — ABNORMAL HIGH (ref 0.0–100.0)

## 2018-10-08 LAB — T4, FREE: Free T4: 0.87 ng/dL (ref 0.82–1.77)

## 2018-10-08 LAB — TSH: TSH: 7.02 u[IU]/mL — ABNORMAL HIGH (ref 0.350–4.500)

## 2018-10-08 LAB — D-DIMER, QUANTITATIVE: D-Dimer, Quant: 0.64 ug/mL-FEU — ABNORMAL HIGH (ref 0.00–0.50)

## 2018-10-08 LAB — TROPONIN I
Troponin I: 0.05 ng/mL (ref <0.03)
Troponin I: 0.13 ng/mL (ref <0.03)

## 2018-10-08 LAB — MAGNESIUM: Magnesium: 2 mg/dL (ref 1.7–2.4)

## 2018-10-08 MED ORDER — LEVOTHYROXINE SODIUM 100 MCG PO TABS
100.0000 ug | ORAL_TABLET | Freq: Every day | ORAL | Status: DC
  Administered 2018-10-09: 100 ug via ORAL
  Filled 2018-10-08: qty 1

## 2018-10-08 MED ORDER — HEPARIN BOLUS VIA INFUSION
2000.0000 [IU] | Freq: Once | INTRAVENOUS | Status: AC
  Administered 2018-10-08: 2000 [IU] via INTRAVENOUS
  Filled 2018-10-08: qty 2000

## 2018-10-08 MED ORDER — SODIUM CHLORIDE 0.9 % IV BOLUS
500.0000 mL | Freq: Once | INTRAVENOUS | Status: AC
  Administered 2018-10-08: 12:00:00 via INTRAVENOUS

## 2018-10-08 MED ORDER — SODIUM CHLORIDE 0.9 % IV SOLN
INTRAVENOUS | Status: DC
  Administered 2018-10-08 – 2018-10-09 (×3): via INTRAVENOUS

## 2018-10-08 MED ORDER — ONDANSETRON HCL 4 MG/2ML IJ SOLN: 4.0000 mg | Freq: Four times a day (QID) | INTRAMUSCULAR | Status: DC | PRN

## 2018-10-08 MED ORDER — ZOLPIDEM TARTRATE 5 MG PO TABS: 5.0000 mg | ORAL_TABLET | Freq: Every evening | ORAL | Status: DC | PRN

## 2018-10-08 MED ORDER — HEPARIN (PORCINE) 25000 UT/250ML-% IV SOLN
1000.0000 [IU]/h | INTRAVENOUS | Status: DC
  Administered 2018-10-08: 1000 [IU]/h via INTRAVENOUS
  Filled 2018-10-08: qty 250

## 2018-10-08 MED ORDER — TRAMADOL HCL 50 MG PO TABS: 50.0000 mg | ORAL_TABLET | Freq: Four times a day (QID) | ORAL | Status: DC | PRN

## 2018-10-08 MED ORDER — ALFUZOSIN HCL ER 10 MG PO TB24
10.0000 mg | ORAL_TABLET | Freq: Every day | ORAL | Status: DC
  Administered 2018-10-09: 10 mg via ORAL
  Filled 2018-10-08: qty 1

## 2018-10-08 MED ORDER — ATORVASTATIN CALCIUM 20 MG PO TABS
20.0000 mg | ORAL_TABLET | Freq: Every day | ORAL | Status: DC
  Administered 2018-10-09: 20 mg via ORAL
  Filled 2018-10-08: qty 1

## 2018-10-08 NOTE — Plan of Care (Signed)
Patient admitted with newly diagnosed atrial fib, heparin drip started.  Daughter in law at bedside.

## 2018-10-08 NOTE — ED Notes (Signed)
ED TO INPATIENT HANDOFF REPORT  Name/Age/Gender Gene Hunter 85 y.o. male  Code Status    Code Status Orders  (From admission, onward)         Start     Ordered   10/08/18 1529  Do not attempt resuscitation (DNR)  Continuous    Question Answer Comment  In the event of cardiac or respiratory ARREST Do not call a "code blue"   In the event of cardiac or respiratory ARREST Do not perform Intubation, CPR, defibrillation or ACLS   In the event of cardiac or respiratory ARREST Use medication by any route, position, wound care, and other measures to relive pain and suffering. May use oxygen, suction and manual treatment of airway obstruction as needed for comfort.      10/08/18 1538        Code Status History    Date Active Date Inactive Code Status Order ID Comments User Context   09/04/2016 2116 09/08/2016 1815 Full Code [id number]  Ivor Costa, MD ED    Advance Directive Documentation     Most Recent Value  Type of Advance Directive  Living will, Healthcare Power of Attorney  Pre-existing out of facility DNR order (yellow form or pink MOST form)  -  "MOST" Form in Place?  -      Home/SNF/Other Home  Chief Complaint Fatigue  Level of Care/Admitting Diagnosis ED Disposition    ED Disposition Condition [place]: [place] [[id number]]  Level of Care: Telemetry [5]  Admit to tele based on following criteria: Complex arrhythmia (Bradycardia/Tachycardia)  Diagnosis: Atrial fibrillation ([place]) [427.31.ICD-9-CM]  Admitting Physician: Lavina Hamman [[id number]]  Attending Physician: Lavina Hamman [[id number]]  PT Class (Do Not Modify): Observation [104]  PT Acc Code (Do Not Modify): Observation [10022]       Medical History Past Medical History:  Diagnosis Date  . Blindness of right eye   . BPH (benign prostatic hyperplasia)   . Essential hypertension   . HLD (hyperlipidemia)   . Hypothyroidism     Allergies Allergies   Allergen Reactions  . Bee Venom Swelling and Other (See Comments)    Reaction:  All over body swelling   . Milk-Related Compounds Rash  . Coricidin D Cold-Flu-Sinus [Chlorphen-Pe-Acetaminophen]   . Maxzide [Hydrochlorothiazide W-Triamterene] Palpitations    IV Location/Drains/Wounds Patient Lines/Drains/Airways Status   Active Line/Drains/Airways    Name:   Placement date:   Placement time:   Site:   Days:   Peripheral IV 10/08/18 Left Forearm   10/08/18    1153    Forearm   less than 1          Labs/Imaging Results for orders placed or performed during the hospital encounter of 10/08/18 (from the past 48 hour(s))  Urinalysis, Routine w reflex microscopic     Status: Abnormal   Collection Time: 10/08/18 10:59 AM  Result Value Ref Range   Color, Urine AMBER (A) YELLOW    Comment: BIOCHEMICALS MAY BE AFFECTED BY COLOR   APPearance HAZY (A) CLEAR   Specific Gravity, Urine 1.024 1.005 - 1.030   pH 5.0 5.0 - 8.0   Glucose, UA NEGATIVE NEGATIVE mg/dL   Hgb urine dipstick NEGATIVE NEGATIVE   Bilirubin Urine NEGATIVE NEGATIVE   Ketones, ur 20 (A) NEGATIVE mg/dL   Protein, ur NEGATIVE NEGATIVE mg/dL   Nitrite NEGATIVE NEGATIVE   Leukocytes,Ua NEGATIVE NEGATIVE    Comment: Performed at [facility], [address] [place], [place] [address]  CBG monitoring, ED     Status: Abnormal   Collection Time: 10/08/18 11:11 AM  Result Value Ref Range   Glucose-Capillary 114 (H) 70 - 99 mg/dL  Basic metabolic panel     Status: Abnormal   Collection Time: 10/08/18 11:57 AM  Result Value Ref Range   Sodium 139 135 - 145 mmol/L   Potassium 4.4 3.5 - 5.1 mmol/L   Chloride 107 98 - 111 mmol/L   CO2 25 22 - 32 mmol/L   Glucose, Bld 122 (H) 70 - 99 mg/dL   BUN 27 (H) 8 - 23 mg/dL   Creatinine, Ser 0.98 0.61 - 1.24 mg/dL   Calcium 9.2 8.9 - 10.3 mg/dL   GFR calc non Af Amer >60 >60 mL/min   GFR calc Af Amer >60 >60 mL/min   Anion gap 7 5 - 15    Comment: Performed at  [facility], [place] [address] [place], [place] [address]  CBC     Status: None   Collection Time: 10/08/18 11:57 AM  Result Value Ref Range   WBC 7.2 4.0 - 10.5 K/uL   RBC 4.41 4.22 - 5.81 MIL/uL   Hemoglobin 13.8 13.0 - 17.0 g/dL   HCT 42.9 39.0 - 52.0 %   MCV 97.3 80.0 - 100.0 fL   MCH 31.3 26.0 - 34.0 pg   MCHC 32.2 30.0 - 36.0 g/dL   RDW 13.3 11.5 - 15.5 %   Platelets 172 150 - 400 K/uL   nRBC 0.0 0.0 - 0.2 %    Comment: Performed at [facility], [place] [address] [place], [place] [address]  D-dimer, quantitative     Status: Abnormal   Collection Time: 10/08/18 11:57 AM  Result Value Ref Range   D-Dimer, Quant 0.64 (H) 0.00 - 0.50 ug/mL-FEU    Comment: (NOTE) At the manufacturer cut-off of 0.50 ug/mL FEU, this assay has been documented to exclude PE with a sensitivity and negative predictive value of 97 to 99%.  At this time, this assay has not been approved by the FDA to exclude DVT/VTE. Results should be correlated with clinical presentation. Performed at [facility], [place] [address], [place], [place] [address]   Brain natriuretic peptide     Status: Abnormal   Collection Time: 10/08/18 11:57 AM  Result Value Ref Range   B Natriuretic Peptide 281.5 (H) 0.0 - 100.0 pg/mL    Comment: Performed at [facility], [place] [address] [place], [place] [address]  Troponin I - Once     Status: Abnormal   Collection Time: 10/08/18 11:57 AM  Result Value Ref Range   Troponin I 0.05 (HH) <0.03 ng/mL    Comment: CRITICAL RESULT CALLED TO, READ BACK BY AND VERIFIED WITH: BRUHAM,R RN [place] Performed at [place] [address], [place],  [address]    Dg Chest 2 View  Result Date: 10/08/2018 CLINICAL DATA:  Weakness EXAM: CHEST - 2 VIEW COMPARISON:  10/21/2016 FINDINGS: Cardiac shadows within normal limits. The lungs are well aerated bilaterally. Aortic  calcifications are noted. No focal infiltrate or sizable effusion is seen. No acute bony abnormality is noted. IMPRESSION: No acute abnormality noted. Electronically Signed   By: Inez Catalina M.D.   On: 10/08/2018 12:41   None  Pending Labs Unresulted Labs (From admission, onward)    Start     Ordered   10/08/18 1545  Troponin I - Now Then Q6H  Now then every 6 hours,   R     10/08/18 1544   10/08/18 1529  TSH  Once,   R     10/08/18 1538   10/08/18 1529  T4, free  Once,   R     10/08/18 1538   10/08/18 1529  Magnesium  Once,   R     10/08/18 1538   10/08/18 1529  APTT  ONCE - STAT,   STAT     10/08/18 1538   10/08/18 1529  Protime-INR  ONCE - STAT,   STAT     10/08/18 1538          Vitals/Pain Today's Vitals   10/08/18 1059 10/08/18 1109 10/08/18 1400 10/08/18 1430  BP: 98/63  114/65 117/74  Pulse: 94  61 65  Resp: 17  10 16   Temp: 97.9 F (36.6 C)     TempSrc: Oral     SpO2: 100%  99% 100%  PainSc:  0-No pain      Isolation Precautions No active isolations  Medications Medications  alfuzosin (UROXATRAL) 24 hr tablet 10 mg (has no administration in time range)  atorvastatin (LIPITOR) tablet 20 mg (has no administration in time range)  levothyroxine (SYNTHROID, LEVOTHROID) tablet 100 mcg (has no administration in time range)  traMADol (ULTRAM) tablet 50 mg (has no administration in time range)  ondansetron (ZOFRAN) injection 4 mg (has no administration in time range)  0.9 %  sodium chloride infusion ( Intravenous New Bag/Given 10/08/18 1617)  zolpidem (AMBIEN) tablet 5 mg (has no administration in time range)  sodium chloride 0.9 % bolus 500 mL (0 mLs Intravenous Stopped 10/08/18 1454)    Mobility walks

## 2018-10-08 NOTE — ED Provider Notes (Signed)
[facility] DEPT Provider Note   CSN: [id number] Arrival date & time: 10/08/18  1040    History   Chief Complaint Chief Complaint  Patient presents with  . Fatigue    HPI Gene Hunter is a 85 y.o. male.     The history is provided by the patient, a relative and medical records. No language interpreter was used.   Gene Hunter is a 85 y.o. male who presents to the Emergency Department complaining of weakness. He presents to the emergency department accompanied by his daughter-in-law for evaluation of weakness and fatigue that began yesterday. He states feeling weak in general with no additional symptoms. He denies any fevers, chills, chest pain, shortness of breath, nausea, vomiting, abdominal pain, hematochezia, melena. No prior similar symptoms. He has a history of hypertension, DVT. He is not anticoagulated. Symptoms are moderate and constant nature. Past Medical History:  Diagnosis Date  . Blindness of right eye   . BPH (benign prostatic hyperplasia)   . Essential hypertension   . HLD (hyperlipidemia)   . Hypothyroidism     Patient Active Problem List   Diagnosis Date Noted  . Atrial fibrillation ([place]) 10/08/2018  . Closed fracture of left hip ([place]) 09/04/2016  . Constipation 09/04/2016  . Fall 09/04/2016  . BPH (benign prostatic hyperplasia) 09/04/2016  . Essential hypertension   . HLD (hyperlipidemia)   . Hypothyroidism     Past Surgical History:  Procedure Laterality Date  . bilateral hip replacement    . ESOPHAGEAL MANOMETRY N/A 11/21/2017   Procedure: ESOPHAGEAL MANOMETRY (EM);  Surgeon: Carol Ada, MD;  Location: [facility];  Service: Endoscopy;  Laterality: N/A;  . HERNIA REPAIR    . JOINT REPLACEMENT    . right knee replacement          Home Medications    Prior to Admission medications   Medication Sig Start Date End Date Taking? Authorizing Provider  alfuzosin (UROXATRAL) 10 MG 24 hr tablet Take 10 mg by  mouth daily. 09/25/18  Yes [provider]  atorvastatin (LIPITOR) 20 MG tablet Take 20 mg by mouth daily.   Yes [provider]  calcium carbonate (OSCAL) 1500 (600 Ca) MG TABS tablet Take 600 mg of elemental calcium by mouth daily with breakfast.   Yes [provider]  cholecalciferol (VITAMIN D) 1000 units tablet Take 1,000 Units by mouth daily.   Yes [provider]  levothyroxine (LEVOXYL) 100 MCG tablet Take 100 mcg by mouth daily before breakfast.    Yes [provider]  lisinopril (PRINIVIL,ZESTRIL) 10 MG tablet Take 10 mg by mouth daily.   Yes [provider]  Multiple Vitamins-Minerals (MULTIVITAMIN WITH MINERALS) tablet Take 1 tablet by mouth daily.   Yes [provider]  traMADol (ULTRAM) 50 MG tablet Take 1 tablet (50 mg total) by mouth every 6 (six) hours as needed for severe pain. 11/04/17  Yes Lawyer, Harrell Gave, PA-C    Family History Family History  Problem Relation Age of Onset  . Heart disease Father     Social History Social History   Tobacco Use  . Smoking status: Former Research scientist (life sciences)  . Smokeless tobacco: Never Used  Substance Use Topics  . Alcohol use: No  . Drug use: No     Allergies   Bee venom; Milk-related compounds; Coricidin d cold-flu-sinus [chlorphen-pe-acetaminophen]; and Maxzide [hydrochlorothiazide w-triamterene]   Review of Systems Review of Systems  All other systems reviewed and are negative.    Physical  Exam Updated Vital Signs BP 117/74   Pulse 65   Temp 97.9 F (36.6 C) (Oral)   Resp 16   SpO2 100%   Physical Exam Vitals signs and nursing note reviewed.  Constitutional:      Appearance: He is well-developed.  HENT:     Head: Normocephalic and atraumatic.  Cardiovascular:     Comments: Distant heart sounds, irregular rhythm Pulmonary:     Effort: Pulmonary effort is normal. No respiratory distress.     Breath sounds: Normal breath sounds.  Abdominal:      Palpations: Abdomen is soft.     Tenderness: There is no abdominal tenderness. There is no guarding or rebound.  Musculoskeletal:        General: No tenderness.  Skin:    General: Skin is warm and dry.  Neurological:     Mental Status: He is alert and oriented to person, place, and time.     Comments: Five out of five strength in all four extremities  Psychiatric:        Behavior: Behavior normal.      ED Treatments / Results  Labs (all labs ordered are listed, but only abnormal results are displayed) Labs Reviewed  BASIC METABOLIC PANEL - Abnormal; Notable for the following components:      Result Value   Glucose, Bld 122 (*)    BUN 27 (*)    All other components within normal limits  URINALYSIS, ROUTINE W REFLEX MICROSCOPIC - Abnormal; Notable for the following components:   Color, Urine AMBER (*)    APPearance HAZY (*)    Ketones, ur 20 (*)    All other components within normal limits  D-DIMER, QUANTITATIVE (NOT AT [facility]) - Abnormal; Notable for the following components:   D-Dimer, Quant 0.64 (*)    All other components within normal limits  BRAIN NATRIURETIC PEPTIDE - Abnormal; Notable for the following components:   B Natriuretic Peptide 281.5 (*)    All other components within normal limits  TROPONIN I - Abnormal; Notable for the following components:   Troponin I 0.05 (*)    All other components within normal limits  CBG MONITORING, ED - Abnormal; Notable for the following components:   Glucose-Capillary 114 (*)    All other components within normal limits  CBC  TSH  T4, FREE  MAGNESIUM  APTT  PROTIME-INR  TROPONIN I  TROPONIN I  TROPONIN I    EKG None ED ECG REPORT   Date: 10/08/2018  Rate: 85  Rhythm: atrial fibrillation  QRS Axis: normal  Intervals: normal  ST/T Wave abnormalities: normal    Conduction Disutrbances:right bundle branch block  Narrative Interpretation:   Old EKG Reviewed: changes noted new RBBB and afib  I have personally  reviewed the EKG tracing and agree with the computerized printout as noted.  Radiology Dg Chest 2 View  Result Date: 10/08/2018 CLINICAL DATA:  Weakness EXAM: CHEST - 2 VIEW COMPARISON:  10/21/2016 FINDINGS: Cardiac shadows within normal limits. The lungs are well aerated bilaterally. Aortic calcifications are noted. No focal infiltrate or sizable effusion is seen. No acute bony abnormality is noted. IMPRESSION: No acute abnormality noted. Electronically Signed   By: Inez Catalina M.D.   On: 10/08/2018 12:41    Procedures Procedures (including critical care time)  Medications Ordered in ED Medications  alfuzosin (UROXATRAL) 24 hr tablet 10 mg (has no administration in time range)  atorvastatin (LIPITOR) tablet 20 mg (has no administration in time range)  levothyroxine (  SYNTHROID, LEVOTHROID) tablet 100 mcg (has no administration in time range)  traMADol (ULTRAM) tablet 50 mg (has no administration in time range)  ondansetron (ZOFRAN) injection 4 mg (has no administration in time range)  0.9 %  sodium chloride infusion (has no administration in time range)  zolpidem (AMBIEN) tablet 5 mg (has no administration in time range)  sodium chloride 0.9 % bolus 500 mL (0 mLs Intravenous Stopped 10/08/18 1454)     Initial Impression / Assessment and Plan / ED Course  I have reviewed the triage vital signs and the nursing notes.  Pertinent labs & imaging results that were available during my care of the patient were reviewed by me and considered in my medical decision making (see chart for details).        Patient here for evaluation of weakness. EKG demonstrates new onset atrial fibrillation, rate controlled. Patient with mild hypotension on ED arrival, improved with fluid bolus. BNP is mildly elevated, no evidence of volume overload. Troponin is mildly elevated,  patient without acute chest pain. D dimer is elevated, but PE unlikely when adjusted for age. Discussed with patient and family findings  of studies and recommendation for admission. Hospitals consulted for admission for new onset a fib with CHF.  Final Clinical Impressions(s) / ED Diagnoses   Final diagnoses:  Atrial fibrillation, unspecified type [facility])    ED Discharge Orders    None       Quintella Reichert, MD 10/08/18 1616

## 2018-10-08 NOTE — H&P (Signed)
[facility] History and Physical   Patient: Gene Hunter UUV:[id number]   PCP: Gene Pretty, MD DOB: 01-20-1926   DOA: 10/08/2018   DOS: 10/08/2018   DOS: the patient was seen and examined on 10/08/2018  Patient coming from: The patient is coming from home.  Chief Complaint: Not feeling well  HPI: Gene Hunter is a 85 y.o. male with Past medical history of BPH, postop DVT, HTN, hypothyroidism, HLD. Patient presents to the hospital with complaints of not feeling well.  He mentions that he has been having generalized fatigue ongoing since last 3 days. He had an episode of vomiting on Wednesday. This morning when he woke up he felt dizzy and lightheaded and felt that he does not have any energy to do any activity. Per daughter going from chair to get his coat was strenuous enough that required to sit down twice. Patient denies any complaints of chest pain chest tightness heaviness.  No nausea no vomiting at the time of my evaluation.  No focal deficit.  No fever no chills.  No recent change in medication.  ED Course: Troponins were mildly elevated on admission.  EKG shows A. fib rate controlled.  Patient was referred for admission for new onset A. fib  At his baseline ambulates with cane And is independent for most of his ADL; manages his medication on his own.  Review of Systems: as mentioned in the history of present illness.  All other systems reviewed and are negative.  Past Medical History:  Diagnosis Date  . Blindness of right eye   . BPH (benign prostatic hyperplasia)   . Essential hypertension   . HLD (hyperlipidemia)   . Hypothyroidism    Past Surgical History:  Procedure Laterality Date  . bilateral hip replacement    . ESOPHAGEAL MANOMETRY N/A 11/21/2017   Procedure: ESOPHAGEAL MANOMETRY (EM);  Surgeon: Carol Ada, MD;  Location: [facility];  Service: Endoscopy;  Laterality: N/A;  . HERNIA REPAIR    . JOINT REPLACEMENT    . right knee replacement      Social History:  reports that he has quit smoking. He has never used smokeless tobacco. He reports that he does not drink alcohol or use drugs.  Allergies  Allergen Reactions  . Bee Venom Swelling and Other (See Comments)    Reaction:  All over body swelling   . Milk-Related Compounds Rash  . Coricidin D Cold-Flu-Sinus [Chlorphen-Pe-Acetaminophen]   . Maxzide [Hydrochlorothiazide W-Triamterene] Palpitations    Family History  Problem Relation Age of Onset  . Heart disease Father      Prior to Admission medications   Medication Sig Start Date End Date Taking? Authorizing Provider  alfuzosin (UROXATRAL) 10 MG 24 hr tablet Take 10 mg by mouth daily. 09/25/18  Yes [provider]  atorvastatin (LIPITOR) 20 MG tablet Take 20 mg by mouth daily.   Yes [provider]  calcium carbonate (OSCAL) 1500 (600 Ca) MG TABS tablet Take 600 mg of elemental calcium by mouth daily with breakfast.   Yes [provider]  cholecalciferol (VITAMIN D) 1000 units tablet Take 1,000 Units by mouth daily.   Yes [provider]  levothyroxine (LEVOXYL) 100 MCG tablet Take 100 mcg by mouth daily before breakfast.    Yes [provider]  lisinopril (PRINIVIL,ZESTRIL) 10 MG tablet Take 10 mg by mouth daily.   Yes [provider]  Multiple Vitamins-Minerals (MULTIVITAMIN WITH MINERALS) tablet Take 1 tablet by mouth daily.   Yes [provider]  traMADol (ULTRAM) 50 MG tablet Take 1 tablet (50 mg total) by mouth every 6 (six) hours as needed for severe pain. 11/04/17  Yes Dalia Heading, PA-C    Physical Exam: Vitals:   10/08/18 1500 10/08/18 1530 10/08/18 1600 10/08/18 1654  BP: 119/79 129/73 127/71 135/70  Pulse: 87 60 69 72  Resp: 17 13 16 16   Temp:    97.6 F (36.4 C)  TempSrc:    Oral  SpO2: 96% 100% 97% 100%  Weight:    59 kg  Height:    6' (1.829 m)    General: Alert, Awake and Oriented to Time, Place and Person. Appear in moderate  distress, affect appropriate Eyes: Right eye pupil reactive, left eye enophthalmos, right conjunctiva normal ENT: Oral Mucosa clear moist Neck: no JVD, no Abnormal Mass Or lumps Cardiovascular: S1 and S2 Present, no Murmur, Peripheral Pulses Present Respiratory: normal respiratory effort, Bilateral Air entry equal and Decreased, no use of accessory muscle, Clear to Auscultation, no Crackles, no wheezes Abdomen: Bowel Sound present, Soft and no tenderness, no hernia Skin: no redness, no Rash, no induration Extremities: nono Pedal edema, no calf tenderness Neurologic: Grossly no focal neuro deficit. Bilaterally Equal motor strength  Labs on Admission:  CBC: Recent Labs  Lab 10/08/18 1157  WBC 7.2  HGB 13.8  HCT 42.9  MCV 97.3  PLT 449   Basic Metabolic Panel: Recent Labs  Lab 10/08/18 1157 10/08/18 1707  NA 139  --   K 4.4  --   CL 107  --   CO2 25  --   GLUCOSE 122*  --   BUN 27*  --   CREATININE 0.98  --   CALCIUM 9.2  --   MG  --  2.0   GFR: Estimated Creatinine Clearance: 41.8 mL/min (by C-G formula based on SCr of 0.98 mg/dL). Liver Function Tests: No results for input(s): AST, ALT, ALKPHOS, BILITOT, PROT, ALBUMIN in the last 168 hours. No results for input(s): LIPASE, AMYLASE in the last 168 hours. No results for input(s): AMMONIA in the last 168 hours. Coagulation Profile: Recent Labs  Lab 10/08/18 1707  INR 1.0   Cardiac Enzymes: Recent Labs  Lab 10/08/18 1157 10/08/18 1707  TROPONINI 0.05* 0.13*   BNP (last 3 results) No results for input(s): PROBNP in the last 8760 hours. HbA1C: No results for input(s): HGBA1C in the last 72 hours. CBG: Recent Labs  Lab 10/08/18 1111  GLUCAP 114*   Lipid Profile: No results for input(s): CHOL, HDL, LDLCALC, TRIG, CHOLHDL, LDLDIRECT in the last 72 hours. Thyroid Function Tests: Recent Labs    10/08/18 1707 10/08/18 1709  TSH  --  7.020*  FREET4 0.87  --    Anemia Panel: No results for input(s):  VITAMINB12, FOLATE, FERRITIN, TIBC, IRON, RETICCTPCT in the last 72 hours. Urine analysis:    Component Value Date/Time   COLORURINE AMBER (A) 10/08/2018 1059   APPEARANCEUR HAZY (A) 10/08/2018 1059   LABSPEC 1.024 10/08/2018 1059   PHURINE 5.0 10/08/2018 1059   GLUCOSEU NEGATIVE 10/08/2018 1059   HGBUR NEGATIVE 10/08/2018 1059   BILIRUBINUR NEGATIVE 10/08/2018 1059   KETONESUR 20 (A) 10/08/2018 1059   PROTEINUR NEGATIVE 10/08/2018 1059   UROBILINOGEN 1.0 09/11/2010 1210   NITRITE NEGATIVE 10/08/2018 1059   LEUKOCYTESUR NEGATIVE 10/08/2018 1059    Radiological Exams on Admission: Dg Chest 2 View  Result Date: 10/08/2018 CLINICAL DATA:  Weakness EXAM: CHEST - 2 VIEW COMPARISON:  10/21/2016 FINDINGS: Cardiac  shadows within normal limits. The lungs are well aerated bilaterally. Aortic calcifications are noted. No focal infiltrate or sizable effusion is seen. No acute bony abnormality is noted. IMPRESSION: No acute abnormality noted. Electronically Signed   By: Inez Catalina M.D.   On: 10/08/2018 12:41   EKG: Independently reviewed. atrial fibrillation, rate controlled.  Assessment/Plan 1.  Rate controlled new onset A. fib. Occasional flutter waves seen on the telemetry. Initial troponin mildly elevated. Patient does not have any complaints of chest pain chest tightness or chest heaviness. No shortness of breath as well. With this the patient will be admitted in the hospital. We will monitor serial troponin. Check echocardiogram tomorrow morning. Patient to be started on IV heparin for now. Patient does have history of anticoagulation use in the past which was stopped as patient had provoked DVT but patient did not have any episodes of bleeding or complication with the blood thinners. Since patient's heart rate is already under control will avoid any rate control medication. Cardiology consult for further work-up. Check TSH and free T4. Gently hydrate the patient.  2.   Hypothyroidism. Check TSH and free T4.  3.  BPH. Continue alfuzosin.  Nutrition: Cardiac diet, n.p.o. after breakfast in the morning. DVT Prophylaxis: subcutaneous Heparin  Advance goals of care discussion: DNR DNI   Consults: cardiology  Family Communication: family was present at bedside, at the time of interview.  Opportunity was given to ask question and all questions were answered satisfactorily.  Disposition: Admitted as observation telemetry unit. Likely to be discharged home, in 2 days.  Author: Berle Mull, MD [facility] 10/08/2018  To reach On-call, see care teams to locate the attending and reach out to them via [email or web address]. If 7PM-7AM, please contact night-coverage If you still have difficulty reaching the attending provider, please page the [facility] (Director on Call) for [facility] on amion for assistance.

## 2018-10-08 NOTE — ED Triage Notes (Signed)
Pt reports that this morning started having fatigue when trying to get up and get around. Pt denies bowel or urinary problems. Pt had vomiting on Wed but none since.

## 2018-10-08 NOTE — Progress Notes (Signed)
[place] for Heparin Indication: atrial fibrillation  Allergies  Allergen Reactions  . Bee Venom Swelling and Other (See Comments)    Reaction:  All over body swelling   . Milk-Related Compounds Rash  . Coricidin D Cold-Flu-Sinus [Chlorphen-Pe-Acetaminophen]   . Maxzide [Hydrochlorothiazide W-Triamterene] Palpitations   Patient Measurements: Height: 6' (182.9 cm) Weight: 130 lb 1 oz (59 kg) IBW/kg (Calculated) : 77.6 Heparin Dosing Weight: 59 kg  Vital Signs: Temp: 97.6 F (36.4 C) (03/01 1654) Temp Source: Oral (03/01 1654) BP: 135/70 (03/01 1654) Pulse Rate: 72 (03/01 1654)  Labs: Recent Labs    10/08/18 1157  HGB 13.8  HCT 42.9  PLT 172  CREATININE 0.98  TROPONINI 0.05*   Estimated Creatinine Clearance: 41.8 mL/min (by C-G formula based on SCr of 0.98 mg/dL).  Medical History: Past Medical History:  Diagnosis Date  . Blindness of right eye   . BPH (benign prostatic hyperplasia)   . Essential hypertension   . HLD (hyperlipidemia)   . Hypothyroidism    Medications:  Scheduled:  . [START ON 10/09/2018] alfuzosin  10 mg Oral Daily  . [START ON 10/09/2018] atorvastatin  20 mg Oral Daily  . heparin  2,000 Units Intravenous Once  . [START ON 10/09/2018] levothyroxine  100 mcg Oral Q0600   Infusions:  . sodium chloride 100 mL/hr at 10/08/18 1617  . heparin      Assessment: 89 yoM to ED 3/1 with weakness, new onset Afib, RBBB - baseline CBC wnl, PMH: HTN, hx DVT/no anti-coag, BPH, HPL, hypoThyroid  Heparin bolus 2000 units, infusion rate 1000 units/hr  Goal of Therapy:  Heparin level 0.3-0.7 units/ml Monitor platelets by anticoagulation protocol: Yes   Plan:   1st Heparin level 8 hr after start of infusion  Daily CBC while on Heparin  Monitor CBC, s/s bleed  Minda Ditto PharmD Pager [phone] 10/08/2018, 5:23 PM

## 2018-10-09 ENCOUNTER — Encounter (HOSPITAL_COMMUNITY): Payer: Self-pay | Admitting: Student

## 2018-10-09 ENCOUNTER — Observation Stay (HOSPITAL_BASED_OUTPATIENT_CLINIC_OR_DEPARTMENT_OTHER): Payer: Medicare Other

## 2018-10-09 DIAGNOSIS — E039 Hypothyroidism, unspecified: Secondary | ICD-10-CM

## 2018-10-09 DIAGNOSIS — I4891 Unspecified atrial fibrillation: Secondary | ICD-10-CM

## 2018-10-09 DIAGNOSIS — I480 Paroxysmal atrial fibrillation: Secondary | ICD-10-CM | POA: Diagnosis not present

## 2018-10-09 LAB — CBC
HCT: 36.8 % — ABNORMAL LOW (ref 39.0–52.0)
Hemoglobin: 11.6 g/dL — ABNORMAL LOW (ref 13.0–17.0)
MCH: 30.8 pg (ref 26.0–34.0)
MCHC: 31.5 g/dL (ref 30.0–36.0)
MCV: 97.6 fL (ref 80.0–100.0)
Platelets: 164 10*3/uL (ref 150–400)
RBC: 3.77 MIL/uL — ABNORMAL LOW (ref 4.22–5.81)
RDW: 13.4 % (ref 11.5–15.5)
WBC: 5.7 10*3/uL (ref 4.0–10.5)
nRBC: 0 % (ref 0.0–0.2)

## 2018-10-09 LAB — ECHOCARDIOGRAM COMPLETE
Height: 72 in
Weight: 2081 oz

## 2018-10-09 LAB — HEPARIN LEVEL (UNFRACTIONATED)
Heparin Unfractionated: 0.55 IU/mL (ref 0.30–0.70)
Heparin Unfractionated: 0.78 IU/mL — ABNORMAL HIGH (ref 0.30–0.70)

## 2018-10-09 LAB — TROPONIN I
Troponin I: 0.12 ng/mL (ref <0.03)
Troponin I: 0.13 ng/mL (ref <0.03)

## 2018-10-09 MED ORDER — APIXABAN 2.5 MG PO TABS: 2.5000 mg | ORAL_TABLET | Freq: Two times a day (BID) | ORAL | 0 refills | Status: DC

## 2018-10-09 MED ORDER — APIXABAN 2.5 MG PO TABS
2.5000 mg | ORAL_TABLET | Freq: Two times a day (BID) | ORAL | Status: DC
  Administered 2018-10-09: 2.5 mg via ORAL
  Filled 2018-10-09: qty 1

## 2018-10-09 MED ORDER — HEPARIN (PORCINE) 25000 UT/250ML-% IV SOLN: 800.0000 [IU]/h | INTRAVENOUS | Status: DC

## 2018-10-09 NOTE — Progress Notes (Signed)
[place] for Heparin Indication: atrial fibrillation  Allergies  Allergen Reactions  . Bee Venom Swelling and Other (See Comments)    Reaction:  All over body swelling   . Milk-Related Compounds Rash  . Coricidin D Cold-Flu-Sinus [Chlorphen-Pe-Acetaminophen]   . Maxzide [Hydrochlorothiazide W-Triamterene] Palpitations   Patient Measurements: Height: 6' (182.9 cm) Weight: 130 lb 1 oz (59 kg) IBW/kg (Calculated) : 77.6 Heparin Dosing Weight: 59 kg  Vital Signs: Temp: 97.5 F (36.4 C) (03/02 0458) Temp Source: Oral (03/01 2127) BP: 126/73 (03/02 0458) Pulse Rate: 58 (03/02 0458)  Labs: Recent Labs    10/08/18 1157 10/08/18 1707 10/08/18 2331 10/09/18 0223 10/09/18 0522  HGB 13.8  --   --   --  11.6*  HCT 42.9  --   --   --  36.8*  PLT 172  --   --   --  164  APTT  --  23*  --   --   --   LABPROT  --  12.8  --   --   --   INR  --  1.0  --   --   --   HEPARINUNFRC  --   --   --  0.55  --   CREATININE 0.98  --   --   --   --   TROPONINI 0.05* 0.13* 0.13*  --  0.12*   Estimated Creatinine Clearance: 41.8 mL/min (by C-G formula based on SCr of 0.98 mg/dL).  Medications:  Scheduled:  . alfuzosin  10 mg Oral Daily  . atorvastatin  20 mg Oral Daily  . levothyroxine  100 mcg Oral Q0600   Infusions:  . sodium chloride 100 mL/hr at 10/09/18 0459  . heparin 1,000 Units/hr (10/08/18 1816)    Assessment: 62 yoM to ED 3/1 with weakness, new onset Afib, RBBB. PMH of HTN, HLD, BPH, hypothyroid. Has Hx provoked DVT and completed prior course of Xarelto w/o compli   Baseline INR, aPTT: WNL  Prior anticoagulation: none, Hx Xarelto  Significant events:  Today, 10/09/2018:  CBC: Hgb/Plt lower, likely dilutional  Troponins remain elevated  Most recent heparin level SUPRAtherapeutic on 1000 units/hr  No bleeding or infusion issues per nursing  Goal of Therapy: Heparin level 0.3-0.7 units/ml Monitor platelets by anticoagulation  protocol: Yes  Plan:  Reduce heparin IV infusion to 800 units/hr  Check heparin level 8 hrs after rate change  Daily CBC, daily heparin level once stable  F/u plans for transition to Eliquis (MD waiting on Echo)  Monitor for signs of bleeding or thrombosis   Reuel Boom, PharmD, BCPS [phone] 10/09/2018, 8:26 AM

## 2018-10-09 NOTE — Discharge Summary (Signed)
Physician Discharge Summary  Gene Hunter JJK:[id number] DOB: 1925-11-20 DOA: 10/08/2018  PCP: [name], MD  Admit date: 10/08/2018 Discharge date: 10/09/2018  Admitted From: Home Disposition: Home  Recommendations for Outpatient Follow-up:  1. Follow up with PCP in 1-2 weeks 2. Please obtain BMP/CBC in one week 3. Please follow up on the following pending results:  Home Health: None Equipment/Devices: None  Discharge Condition: Stable CODE STATUS: Full code  HPI: Per Dr. Burgess Estelle is a 85 y.o. male with Past medical history of BPH, postop DVT no longer on medications, HTN, hypothyroidism, HLD. Patient presents to the hospital with complaints of not feeling well.  He mentions that he has been having generalized fatigue ongoing since last 3 days. He had an episode of vomiting on Wednesday. This morning when he woke up he felt dizzy and lightheaded and felt that he does not have any energy to do any activity. Per daughter going from chair to get his coat was strenuous enough that required to sit down twice. Patient denies any complaints of chest pain chest tightness heaviness.  No nausea no vomiting at the time of my evaluation.  No focal deficit.  No fever no chills.  No recent change in medication.  ED Course: Troponins were mildly elevated on admission.  EKG shows A. fib rate controlled.  Patient was referred for admission for new onset [place] [facility] Course: Patient with past medical history as above admitted with generalized fatigue for 3 days and lightheadedness.  On admission, found to be in A. fib without RVR.  Started on heparin drip.  He did not need rate or rhythm control due to borderline low heart rate.  He has chads vasc score of 3.  Started on heparin drip for anticoagulation and transitioned to Eliquis 2.5 mg twice a day.  Echocardiogram with EF of 60 to 65% and severely increased left ventricular wall thickness, severe thickening and calcification of  aortic valve with moderately decreased cusp excursion without evidence of aortic valve stenosis or regurgitation.  Patient was evaluated by physical therapy prior to discharge and no need was identified. Patient was discharged on Eliquis 2.5 mg twice daily.  See individual problem list below for more.  Discharge Diagnoses:  Active Problems:   Atrial fibrillation (HCC)  Rate controlled new onset atrial fibrillation: Stable.  TSH mildly elevated but difficult to interpret in acute setting.  Echocardiogram as below. -Discharged on Eliquis 2.5 mg twice daily.  Hypothyroidism: TSH is mildly elevated.  Recommend rechecking at follow-up and adjusting his Synthroid as appropriate.  Unreliable in acute setting.  BPH: Stable. Continue home medication. Discharge Instructions  Discharge Instructions    Diet - low sodium heart healthy   Complete by:  As directed    Discharge instructions   Complete by:  As directed    It has been a pleasure taking care of you! -You were admitted due to atrial fibrillation (irregular heartbeat).  As this would increase your risk of stroke, we have started you on blood thinner that you need to continue taking.  There is no significant or major finding on your echocardiogram. Please avoid over-the-counter pain medication except for plain Tylenol while you take this blood thinner.  Please follow-up with your primary care doctor in 1 to 2 weeks.  Once you are discharged, your primary care physician will handle any further medical issues. Please note that NO REFILLS for any discharge medications will be authorized once you are discharged, as  it is imperative that you return to your primary care physician (or establish a relationship with a primary care physician if you do not have one) for your aftercare needs so that they can reassess your need for medications and monitor your lab values. Take care,   Increase activity slowly   Complete by:  As directed      Allergies  as of 10/09/2018      Reactions   Bee Venom Swelling, Other (See Comments)   Reaction:  All over body swelling    Milk-related Compounds Rash   Coricidin D Cold-flu-sinus [chlorphen-pe-acetaminophen]    Maxzide [hydrochlorothiazide W-triamterene] Palpitations      Medication List    TAKE these medications   alfuzosin 10 MG 24 hr tablet Commonly known as:  UROXATRAL Take 10 mg by mouth daily.   apixaban 2.5 MG Tabs tablet Commonly known as:  ELIQUIS Take 1 tablet (2.5 mg total) by mouth 2 (two) times daily. Start taking on:  October 10, 2018   atorvastatin 20 MG tablet Commonly known as:  LIPITOR Take 20 mg by mouth daily.   calcium carbonate 1500 (600 Ca) MG Tabs tablet Commonly known as:  OSCAL Take 600 mg of elemental calcium by mouth daily with breakfast.   cholecalciferol 1000 units tablet Commonly known as:  VITAMIN D Take 1,000 Units by mouth daily.   LEVOXYL 100 MCG tablet Generic drug:  levothyroxine Take 100 mcg by mouth daily before breakfast.   lisinopril 10 MG tablet Commonly known as:  PRINIVIL,ZESTRIL Take 10 mg by mouth daily.   multivitamin with minerals tablet Take 1 tablet by mouth daily.   traMADol 50 MG tablet Commonly known as:  ULTRAM Take 1 tablet (50 mg total) by mouth every 6 (six) hours as needed for severe pain.       Consultations:  None  Procedures/Studies: 2D Echo:  1. The left ventricle has normal systolic function with an ejection fraction of 60-65%. The cavity size was normal. There is severely increased left ventricular wall thickness. Left ventricular diastology could not be evaluated secondary to atrial  fibrillation.  2. Left atrial size was mildly dilated.  3. The mitral valve is degenerative. Mild thickening of the mitral valve leaflet. Mild calcification of the mitral valve leaflet. There is severe mitral annular calcification present.  4. The tricuspid valve is normal in structure.  5. The aortic valve is tricuspid  Severely thickening of the aortic valve Severe calcifcation of the aortic valve.  6. The pulmonic valve was normal in structure.  7. The inferior vena cava was dilated in size with <50% respiratory variability.  Dg Chest 2 View  Result Date: 10/08/2018 CLINICAL DATA:  Weakness EXAM: CHEST - 2 VIEW COMPARISON:  10/21/2016 FINDINGS: Cardiac shadows within normal limits. The lungs are well aerated bilaterally. Aortic calcifications are noted. No focal infiltrate or sizable effusion is seen. No acute bony abnormality is noted. IMPRESSION: No acute abnormality noted. Electronically Signed   By: Inez Catalina M.D.   On: 10/08/2018 12:41    Subjective: No major events overnight of this morning.  Denies chest pain, dyspnea, dizziness or palpitation.   Discharge Exam: Vitals:   10/09/18 1042 10/09/18 1323  BP: 137/67 125/74  Pulse: 64 71  Resp: 16 16  Temp: 97.8 F (36.6 C) (!) 97.4 F (36.3 C)  SpO2: 100% 99%    GENERAL: Appears well. No acute distress.  HEENT: MMM.  Vision and Hearing grossly intact.  NECK: Supple.  No JVD.  LUNGS:  No IWOB. Good air movement. CTAB.  HEART: Irregular rhythm.  Normal rate. Heart sounds normal. ABD: Bowel sounds present. Soft. Non tender.  EXT:   no edema bilaterally.  Moves all extremities. SKIN: no apparent skin lesion.  NEURO: Awake, alert and oriented appropriately.  No gross deficit.  PSYCH: Calm. Normal affect.    The results of significant diagnostics from this hospitalization (including imaging, microbiology, ancillary and laboratory) are listed below for reference.     Microbiology: No results found for this or any previous visit (from the past 240 hour(s)).   Labs: BNP (last 3 results) Recent Labs    10/08/18 1157  BNP 638.4*   Basic Metabolic Panel: Recent Labs  Lab 10/08/18 1157 10/08/18 1707  NA 139  --   K 4.4  --   CL 107  --   CO2 25  --   GLUCOSE 122*  --   BUN 27*  --   CREATININE 0.98  --   CALCIUM 9.2  --   MG   --  2.0   Liver Function Tests: No results for input(s): AST, ALT, ALKPHOS, BILITOT, PROT, ALBUMIN in the last 168 hours. No results for input(s): LIPASE, AMYLASE in the last 168 hours. No results for input(s): AMMONIA in the last 168 hours. CBC: Recent Labs  Lab 10/08/18 1157 10/09/18 0522  WBC 7.2 5.7  HGB 13.8 11.6*  HCT 42.9 36.8*  MCV 97.3 97.6  PLT 172 164   Cardiac Enzymes: Recent Labs  Lab 10/08/18 1157 10/08/18 1707 10/08/18 2331 10/09/18 0522  TROPONINI 0.05* 0.13* 0.13* 0.12*   BNP: Invalid input(s): POCBNP CBG: Recent Labs  Lab 10/08/18 1111  GLUCAP 114*   D-Dimer Recent Labs    10/08/18 1157  DDIMER 0.64*   Hgb A1c No results for input(s): HGBA1C in the last 72 hours. Lipid Profile No results for input(s): CHOL, HDL, LDLCALC, TRIG, CHOLHDL, LDLDIRECT in the last 72 hours. Thyroid function studies Recent Labs    10/08/18 1709  TSH 7.020*   Anemia work up No results for input(s): VITAMINB12, FOLATE, FERRITIN, TIBC, IRON, RETICCTPCT in the last 72 hours. Urinalysis    Component Value Date/Time   COLORURINE AMBER (A) 10/08/2018 1059   APPEARANCEUR HAZY (A) 10/08/2018 1059   LABSPEC 1.024 10/08/2018 1059   PHURINE 5.0 10/08/2018 1059   GLUCOSEU NEGATIVE 10/08/2018 1059   HGBUR NEGATIVE 10/08/2018 1059   BILIRUBINUR NEGATIVE 10/08/2018 1059   KETONESUR 20 (A) 10/08/2018 1059   PROTEINUR NEGATIVE 10/08/2018 1059   UROBILINOGEN 1.0 09/11/2010 1210   NITRITE NEGATIVE 10/08/2018 1059   LEUKOCYTESUR NEGATIVE 10/08/2018 1059   Sepsis Labs Invalid input(s): PROCALCITONIN,  WBC,  LACTICIDVEN   Time coordinating discharge: 25 minutes  SIGNED:  Mercy Riding, MD  [facility] 10/09/2018, 4:48 PM Pager [phone]  If 7PM-7AM, please contact night-coverage [email or web address] Password [email or web address]

## 2018-10-09 NOTE — Progress Notes (Signed)
ANTICOAGULATION CONSULT NOTE - Follow Up Consult  Pharmacy Consult for Heparin Indication: atrial fibrillation  Allergies  Allergen Reactions  . Bee Venom Swelling and Other (See Comments)    Reaction:  All over body swelling   . Milk-Related Compounds Rash  . Coricidin D Cold-Flu-Sinus [Chlorphen-Pe-Acetaminophen]   . Maxzide [Hydrochlorothiazide W-Triamterene] Palpitations    Patient Measurements: Height: 6' (182.9 cm) Weight: 130 lb 1 oz (59 kg) IBW/kg (Calculated) : 77.6 Heparin Dosing Weight:   Vital Signs: Temp: 97.5 F (36.4 C) (03/02 0458) Temp Source: Oral (03/01 2127) BP: 126/73 (03/02 0458) Pulse Rate: 58 (03/02 0458)  Labs: Recent Labs    10/08/18 1157 10/08/18 1707 10/08/18 2331 10/09/18 0223 10/09/18 0522  HGB 13.8  --   --   --  11.6*  HCT 42.9  --   --   --  36.8*  PLT 172  --   --   --  164  APTT  --  23*  --   --   --   LABPROT  --  12.8  --   --   --   INR  --  1.0  --   --   --   HEPARINUNFRC  --   --   --  0.55  --   CREATININE 0.98  --   --   --   --   TROPONINI 0.05* 0.13* 0.13*  --   --     Estimated Creatinine Clearance: 41.8 mL/min (by C-G formula based on SCr of 0.98 mg/dL).   Medications:  Infusions:  . sodium chloride 100 mL/hr at 10/09/18 0459  . heparin 1,000 Units/hr (10/08/18 1816)    Assessment: Patient with heparin level at goal.  No heparin issues noted.  Goal of Therapy:  Heparin level 0.3-0.7 units/ml Monitor platelets by anticoagulation protocol: Yes   Plan:  Continue heparin drip at current rate Recheck level at [address] [place] 10/09/2018,6:28 AM

## 2018-10-09 NOTE — Progress Notes (Signed)
Patient discharged home with daughter, discharge instructions given and explained to patient/daughter and they verbalized understanding, patient denies any pain/distress. No pressure injury noted. Accompanied home by daughter.

## 2018-10-09 NOTE — Discharge Instructions (Signed)

## 2018-10-09 NOTE — Care Management Obs Status (Signed)
[facility] NOTIFICATION   Patient Details  Name: Gene Hunter MRN: [id number] Date of Birth: September 19, 1925   Medicare Observation Status Notification Given:  Yes    MahabirJuliann Pulse, RN 10/09/2018, 4:09 PM

## 2018-10-09 NOTE — Progress Notes (Signed)
Cardiology Consult    Patient ID: Gene Hunter MRN: [id number], DOB/AGE: 1926-07-07   Admit date: 10/08/2018 Date of Consult: 10/09/2018  Primary Physician: Gene Pretty, MD Primary Cardiologist: New to [facility] (Dr. Harrell Hunter) Requesting Provider: Wendee Beavers, MD  Patient Profile    Gene Hunter is a 85 y.o. male with a history of hypertension, hyperlipidemia, hypothyroidism, and provoked DVT following hip fracture, who is being seen today for the evaluation of  at the request of Dr. Cyndia Hunter.  History of Present Illness    Gene Hunter is a 85 year old male with a history of hypertension, hyperlipidemia, hypothyroidism, and provoked DVT following hip fracture but no known cardiac history. Patient has never seen a Cardiologist or had any type of cardiac work-up.   Patient presented to the [facility] ED yesterday for evaluation of fatigue. Patient states he has been in his usual state of health until yesterday when he felt so fatigued and weak that he was unable to get out of bed. He denies any chest pain, shortness of breath, palpitations, lightheadedness, or dizziness. No recent fevers or illness. He did have one isolated incidence of nausea and vomiting last Wednesday but none since. Patient stays active around his home and denies any exertional chest pain or shortess of breath. He did have a fall about 1 month ago while he was walking outside in the dark. He does not specifically remember tripping on anything but denies any symptoms prior to the fall, denies any loss of consciousness, and denies hitting his head.   Of note, patient states he had gross hematuria about 8 to 12 months ago which he was worked up for, but he denies any recent hematuria. He also denies any other abnormal bleeding.  In the ED, vitals stable. Unable to review EKG in chart. Initial troponin minimally elevated at 0.05. Chest x-ray showed no acute findings. D-dimer 0.64 but negative when adjusted for age. BNP elevated  at 281.5. CBC and BMP unremarkable. Patient was admitted for further evaluation.   Past Medical History   Past Medical History:  Diagnosis Date  . Blindness of right eye   . BPH (benign prostatic hyperplasia)   . Essential hypertension   . HLD (hyperlipidemia)   . Hypothyroidism     Past Surgical History:  Procedure Laterality Date  . bilateral hip replacement    . ESOPHAGEAL MANOMETRY N/A 11/21/2017   Procedure: ESOPHAGEAL MANOMETRY (EM);  Surgeon: Carol Ada, MD;  Location: [facility];  Service: Endoscopy;  Laterality: N/A;  . HERNIA REPAIR    . JOINT REPLACEMENT    . right knee replacement       Allergies  Allergies  Allergen Reactions  . Bee Venom Swelling and Other (See Comments)    Reaction:  All over body swelling   . Milk-Related Compounds Rash  . Coricidin D Cold-Flu-Sinus [Chlorphen-Pe-Acetaminophen]   . Maxzide [Hydrochlorothiazide W-Triamterene] Palpitations    Inpatient Medications    . alfuzosin  10 mg Oral Daily  . atorvastatin  20 mg Oral Daily  . levothyroxine  100 mcg Oral Q0600    Family History    Family History  Problem Relation Age of Onset  . Heart disease Father   . Heart attack Father        in his 75's to 8's  . Heart attack Mother        in her 59's   He indicated that his mother is deceased. He indicated that his father is deceased.  Social History    Social History   Socioeconomic History  . Marital status: Widowed    Spouse name: Not on file  . Number of children: Not on file  . Years of education: Not on file  . Highest education level: Not on file  Occupational History  . Not on file  Social Needs  . Financial resource strain: Not on file  . Food insecurity:    Worry: Not on file    Inability: Not on file  . Transportation needs:    Medical: Not on file    Non-medical: Not on file  Tobacco Use  . Smoking status: Former Research scientist (life sciences)  . Smokeless tobacco: Never Used  Substance and Sexual Activity  . Alcohol  use: No  . Drug use: No  . Sexual activity: Not on file  Lifestyle  . Physical activity:    Days per week: Not on file    Minutes per session: Not on file  . Stress: Not on file  Relationships  . Social connections:    Talks on phone: Not on file    Gets together: Not on file    Attends religious service: Not on file    Active member of club or organization: Not on file    Attends meetings of clubs or organizations: Not on file    Relationship status: Not on file  . Intimate partner violence:    Fear of current or ex partner: Not on file    Emotionally abused: Not on file    Physically abused: Not on file    Forced sexual activity: Not on file  Other Topics Concern  . Not on file  Social History Narrative  . Not on file     Review of Systems    Review of Systems  Constitutional: Positive for malaise/fatigue. Negative for chills and fever.  HENT: Negative for congestion and sore throat.   Respiratory: Negative for cough, hemoptysis, sputum production and shortness of breath.   Cardiovascular: Negative for chest pain, palpitations, orthopnea, leg swelling and PND.  Gastrointestinal: Positive for nausea and vomiting. Negative for blood in stool.  Genitourinary: Negative for hematuria.  Musculoskeletal: Negative for myalgias.  Neurological: Positive for weakness. Negative for dizziness and loss of consciousness.  Endo/Heme/Allergies: Does not bruise/bleed easily.  Psychiatric/Behavioral: Negative for substance abuse.    Physical Exam    Blood pressure 126/73, pulse (!) 58, temperature (!) 97.5 F (36.4 C), resp. rate 20, height 6' (1.829 m), weight 59 kg, SpO2 100 %.  General: 85 y.o. male resting comfortably in no acute distress. Pleasant and cooperative. HEENT: Normal  Neck: Supple. No carotid bruits or JVD appreciated. Lungs: No increased work of breathing. Clear to auscultation bilaterally. No wheezes, rhonchi, or rales. Heart: Irregular rhythm with regular rate.  Distinct S1 and S2. No murmurs, gallops, or rubs.  Abdomen: Soft, non-distended, and non-tender to palpation. Bowel sounds present.   Extremities: No clubbing, cyanosis or edema. Radial pulses 2+ and equal bilaterally. Skin: Warm and dry. Neuro: Alert and oriented x3. No focal deficits. Moves all extremities spontaneously. Psych: Normal affect.   Labs    Troponin (Point of Care Test) No results for input(s): TROPIPOC in the last 72 hours. Recent Labs    10/08/18 1157 10/08/18 1707 10/08/18 2331 10/09/18 0522  TROPONINI 0.05* 0.13* 0.13* 0.12*   Lab Results  Component Value Date   WBC 5.7 10/09/2018   HGB 11.6 (L) 10/09/2018   HCT 36.8 (L) 10/09/2018   MCV 97.6  10/09/2018   PLT 164 10/09/2018    Recent Labs  Lab 10/08/18 1157  NA 139  K 4.4  CL 107  CO2 25  BUN 27*  CREATININE 0.98  CALCIUM 9.2  GLUCOSE 122*   No results found for: CHOL, HDL, LDLCALC, TRIG Lab Results  Component Value Date   DDIMER 0.64 (H) 10/08/2018     Radiology Studies    Dg Chest 2 View  Result Date: 10/08/2018 CLINICAL DATA:  Weakness EXAM: CHEST - 2 VIEW COMPARISON:  10/21/2016 FINDINGS: Cardiac shadows within normal limits. The lungs are well aerated bilaterally. Aortic calcifications are noted. No focal infiltrate or sizable effusion is seen. No acute bony abnormality is noted. IMPRESSION: No acute abnormality noted. Electronically Signed   By: Inez Catalina M.D.   On: 10/08/2018 12:41    EKG     EKG: EKG was personally reviewed and demonstrates: Unable to view EKG from ED in chart. Telemetry: Telemetry was personally reviewed and demonstrates: Atrial fibrillation with ventricular rates ranging from the 40's to 90's. A few pauses all < 2.5 seconds also noted.   Cardiac Imaging    Echo pending.   Assessment & Plan    1. New Onset Atrial Fibrillation - Patient presented with increased fatigue and weakness and was found to be in atrial fibirllation. No chest pain, palpitation,  lightheadedness, or dizziness.  - Telemetry shows atrial fibrillation with ventricular rates ranging from high 40's to 90's with a few pauses all < 2.5 seconds. - D-dimer elevated at 0.64 but negative when adjusted for age. - TSH elevated at 7.020 but free T4 normal. - Potassium 4.4.  - Magnesium 2.0. - Will check Echo. - Will not start AV nodal agent at this time considering rate is well controlled and he is bradycardic at times. - CHA2DS2-VASc = 3 (HTN and age x2). Patient is currently on IV Heparin. Can likely transition to [place]. Patient has been on Xarelto before for a provoked DVT and tolerated that well.   2. Elevated Troponin and BNP - Troponin minimally elevated and flat at 0.05 >> 0.13 >> 0.13 >> 0.12. Not consistent with ACS. Possibly secondary to atrial fibrillation with slow ventricular rate. Patient denies any chest pain or shortness of breath.  - BNP elevated at 281.5 but patient does not appear volume overloaded on exam. - Will check Echo.  - Do not anticipate any further ischemic evaluation at this time pending Echo results. Will discuss with MD.   Signed, Darreld Mclean, PA-C 10/09/2018, 10:07 AM  For questions or updates, please contact   Please consult [email or web address] for contact info under Cardiology/STEMI.

## 2018-10-09 NOTE — Progress Notes (Signed)
  Echocardiogram 2D Echocardiogram has been performed.  Gene Hunter M 10/09/2018, 3:30 PM

## 2018-10-09 NOTE — Evaluation (Signed)
Physical Therapy One Time Evaluation Patient Details Name: Gene Hunter MRN: [id number] DOB: Jun 25, 1926 Today's Date: 10/09/2018   History of Present Illness  85 y.o. male with a history of hypertension, hyperlipidemia, hypothyroidism, and provoked DVT following hip fracture, bil THR, R TKR, blind in R eye, and admitted for new onset afib.  Clinical Impression  Patient evaluated by Physical Therapy with no further acute PT needs identified. All education has been completed and the patient has no further questions.  Pt ambulated in hallway and reports feeling better.  Pt lives alone however daughter in law and grandson (lawn care) assist as needed.  See below for any follow-up Physical Therapy or equipment needs. PT is signing off. Thank you for this referral.     Follow Up Recommendations No PT follow up    Equipment Recommendations  None recommended by PT    Recommendations for Other Services       Precautions / Restrictions Precautions Precautions: None Precaution Comments: blind R eye      Mobility  Bed Mobility Overal bed mobility: Modified Independent                Transfers Overall transfer level: Modified independent                  Ambulation/Gait Ambulation/Gait assistance: Supervision;Modified independent (Device/Increase time) Gait Distance (Feet): 400 Feet Assistive device: IV Pole Gait Pattern/deviations: Step-through pattern;Decreased stride length     General Gait Details: pt denies any symptoms, tolerated good distance  Stairs            Wheelchair Mobility    Modified Rankin (Stroke Patients Only)       Balance Overall balance assessment: No apparent balance deficits (not formally assessed)(denies any recent falls except outside a couple months ago with ground level changes)                                           Pertinent Vitals/Pain Pain Assessment: No/denies pain    Home Living Family/patient  expects to be discharged to:: Private residence Living Arrangements: Alone Available Help at Discharge: Family;Available PRN/intermittently Type of Home: House Home Access: Stairs to enter Entrance Stairs-Rails: Right Entrance Stairs-Number of Steps: 6 Home Layout: Able to live on main level with bedroom/bathroom;Laundry or work area in Education administrator: Kasandra Knudsen - single point      Prior Function Level of Independence: Independent with assistive device(s)         Comments: pt ambulatory with SPC, performs his own ADLS, does have housekeeper who sometimes assists with laundry (in basement)     Hand Dominance        Extremity/Trunk Assessment        Lower Extremity Assessment Lower Extremity Assessment: Overall WFL for tasks assessed    Cervical / Trunk Assessment Cervical / Trunk Assessment: Normal  Communication   Communication: No difficulties  Cognition Arousal/Alertness: Awake/alert Behavior During Therapy: WFL for tasks assessed/performed Overall Cognitive Status: Within Functional Limits for tasks assessed                                        General Comments      Exercises     Assessment/Plan    PT Assessment Patent does not need any further PT services  PT Problem List         PT Treatment Interventions      PT Goals (Current goals can be found in the Care Plan section)  Acute Rehab PT Goals PT Goal Formulation: All assessment and education complete, DC therapy    Frequency     Barriers to discharge        Co-evaluation               AM-PAC PT "6 Clicks" Mobility  Outcome Measure Help needed turning from your back to your side while in a flat bed without using bedrails?: None Help needed moving from lying on your back to sitting on the side of a flat bed without using bedrails?: None Help needed moving to and from a bed to a chair (including a wheelchair)?: None Help needed standing up from a  chair using your arms (e.g., wheelchair or bedside chair)?: A Little Help needed to walk in hospital room?: A Little Help needed climbing 3-5 steps with a railing? : A Little 6 Click Score: 21    End of Session Equipment Utilized During Treatment: Gait belt Activity Tolerance: Patient tolerated treatment well Patient left: in bed;with call bell/phone within reach;with bed alarm set;with family/visitor present Nurse Communication: Mobility status PT Visit Diagnosis: Difficulty in walking, not elsewhere classified (R26.2)    Time: 4718-5501 PT Time Calculation (min) (ACUTE ONLY): 17 min   Charges:   PT Evaluation $PT Eval Low Complexity: [place], PT, DPT Acute Rehabilitation Services Office: [phone] Pager: [phone]  Trena Platt 10/09/2018, 12:32 PM

## 2018-10-16 DIAGNOSIS — N402 Nodular prostate without lower urinary tract symptoms: Secondary | ICD-10-CM | POA: Diagnosis not present

## 2018-10-16 DIAGNOSIS — E039 Hypothyroidism, unspecified: Secondary | ICD-10-CM | POA: Diagnosis not present

## 2018-10-16 DIAGNOSIS — E559 Vitamin D deficiency, unspecified: Secondary | ICD-10-CM | POA: Diagnosis not present

## 2018-10-16 DIAGNOSIS — Z09 Encounter for follow-up examination after completed treatment for conditions other than malignant neoplasm: Secondary | ICD-10-CM | POA: Diagnosis not present

## 2018-10-16 DIAGNOSIS — E785 Hyperlipidemia, unspecified: Secondary | ICD-10-CM | POA: Diagnosis not present

## 2018-10-16 DIAGNOSIS — I10 Essential (primary) hypertension: Secondary | ICD-10-CM | POA: Diagnosis not present

## 2018-10-16 DIAGNOSIS — I4891 Unspecified atrial fibrillation: Secondary | ICD-10-CM | POA: Diagnosis not present

## 2018-10-24 DIAGNOSIS — E875 Hyperkalemia: Secondary | ICD-10-CM | POA: Diagnosis not present

## 2018-10-25 ENCOUNTER — Ambulatory Visit (INDEPENDENT_AMBULATORY_CARE_PROVIDER_SITE_OTHER): Payer: Medicare Other | Admitting: Adult Health

## 2018-10-25 ENCOUNTER — Encounter: Payer: Self-pay | Admitting: Adult Health

## 2018-10-25 VITALS — BP 101/59 | HR 70 | Ht 71.0 in | Wt 133.8 lb

## 2018-10-25 DIAGNOSIS — I480 Paroxysmal atrial fibrillation: Secondary | ICD-10-CM

## 2018-10-25 DIAGNOSIS — I952 Hypotension due to drugs: Secondary | ICD-10-CM

## 2018-10-25 DIAGNOSIS — I358 Other nonrheumatic aortic valve disorders: Secondary | ICD-10-CM

## 2018-10-25 MED ORDER — LISINOPRIL 5 MG PO TABS: 5.0000 mg | ORAL_TABLET | Freq: Every day | ORAL | 12 refills | Status: DC

## 2018-10-25 NOTE — Patient Instructions (Addendum)
Medication Instructions:  DECREASE LISINOPRIL 5MG  DAILY If you need a refill on your cardiac medications before your next appointment, please call your pharmacy.  Labwork: NONE ORDERED  When you have your labs (blood work) drawn today and your tests are completely normal, you will receive your results only by MyChart Message (if you have MyChart) -OR-  A paper copy in the mail.  If you have any lab test that is abnormal or we need to change your treatment, we will call you to review these results.  Special Instructions: TAKE AND LOG YOUR BP AT THE SAME TIME DAILY AND BRING WITH YOU TO YOUR APPOINTMENTS  Follow-Up: You will need a follow up appointment in 1 months. You may see Buford Dresser, MD, Jory Sims, DNP, AACC or one of the following Advanced Practice Providers on your designated Care Team:   Jory Sims, DNP, AACC  Rosaria Ferries, PA-C  At [facility], you and your health needs are our priority.  As part of our continuing mission to provide you with exceptional heart care, we have created designated Provider Care Teams.  These Care Teams include your primary Cardiologist (physician) and Advanced Practice Providers (APPs -  Physician Assistants and Nurse Practitioners) who all work together to provide you with the care you need, when you need it.  Thank you for choosing [facility] at [facility]!!

## 2018-10-25 NOTE — Progress Notes (Signed)
Cardiology Office Note   Date:  10/25/2018   ID:  DASHEL GOINES, DOB Aug 01, 1926, MRN [id number]  PCP:  [name], MD  Cardiologist:  Dr.Christopher  Chief Complaint  Patient presents with  . Follow-up    pt c/o low energy; no other Sx.  Marland Kitchen Hospitalization Follow-up  . Hypertension  . Atrial Fibrillation     History of Present Illness: GRANT SWAGER is a 85 y.o. male who presents for ongoing assessment and management of atrial fibrillation. He was diagnosed with new onset during hospitalization in March 2020. He had been on Xarelto in the past for DVT. He has a CHADS VASC Score of 3. He has other history of gross hematuria about one year ago, HTN, HL, and Hypothyroidism.  He was not placed on AV nodal blocking agents due to bradycardia. Echocardiogram revealed EF of  60%-65% without severe thickening and calcification of the aortic valve without stenosis.Marland Kitchen He was started on Eliquis 2.5 mg BID with CHADS Vasc Score of 3.   He is accompanied by his daughter today. He denies chest pain, DOE, or dizziness. She states that his energy is not very good, and has declined since he left the hospital. She states that he used to walk to the mailbox and be more active, but has not been doing this since he came home. He states that he has "been lazy."     Past Medical History:  Diagnosis Date  . Blindness of right eye   . BPH (benign prostatic hyperplasia)   . Essential hypertension   . HLD (hyperlipidemia)   . Hypothyroidism     Past Surgical History:  Procedure Laterality Date  . bilateral hip replacement    . ESOPHAGEAL MANOMETRY N/A 11/21/2017   Procedure: ESOPHAGEAL MANOMETRY (EM);  Surgeon: Carol Ada, MD;  Location: [facility];  Service: Endoscopy;  Laterality: N/A;  . HERNIA REPAIR    . JOINT REPLACEMENT    . right knee replacement       Current Outpatient Medications  Medication Sig Dispense Refill  . alfuzosin (UROXATRAL) 10 MG 24 hr tablet Take 10 mg by mouth  daily.    Marland Kitchen apixaban (ELIQUIS) 2.5 MG TABS tablet Take 1 tablet (2.5 mg total) by mouth 2 (two) times daily. 60 tablet 0  . atorvastatin (LIPITOR) 20 MG tablet Take 20 mg by mouth daily.    . calcium carbonate (OSCAL) 1500 (600 Ca) MG TABS tablet Take 600 mg of elemental calcium by mouth daily with breakfast.    . cholecalciferol (VITAMIN D) 1000 units tablet Take 1,000 Units by mouth daily.    Marland Kitchen docusate sodium (COLACE) 100 MG capsule Take 100 mg by mouth daily.    Marland Kitchen levothyroxine (LEVOXYL) 100 MCG tablet Take 100 mcg by mouth daily before breakfast.     . lisinopril (PRINIVIL,ZESTRIL) 5 MG tablet Take 1 tablet (5 mg total) by mouth daily. 30 tablet 12  . Multiple Vitamins-Minerals (MULTIVITAMIN WITH MINERALS) tablet Take 1 tablet by mouth daily.    . traMADol (ULTRAM) 50 MG tablet Take 1 tablet (50 mg total) by mouth every 6 (six) hours as needed for severe pain. 15 tablet 0   No current facility-administered medications for this visit.     Allergies:   Bee venom; Milk-related compounds; Coricidin d cold-flu-sinus [chlorphen-pe-acetaminophen]; and Maxzide [hydrochlorothiazide w-triamterene]    Social History:  The patient  reports that he has quit smoking. He has never used smokeless tobacco. He reports that he does not drink alcohol  or use drugs.   Family History:  The patient's family history includes Heart attack in his father and mother; Heart disease in his father.    ROS: All other systems are reviewed and negative. Unless otherwise mentioned in H&P    PHYSICAL EXAM: VS:  BP (!) 101/59   Pulse 70   Ht 5\' 11"  (1.803 m)   Wt 133 lb 12.8 oz (60.7 kg)   BMI 18.66 kg/m  , BMI Body mass index is 18.66 kg/m. GEN: Well nourished, well developed, in no acute distress HEENT: normal Neck: no JVD, carotid bruits, or masses Cardiac: RRR, 1/6 systolic murmur, distant heart sounds; rubs, or gallops,no edema  Respiratory:  Mild crackles in the bases. No coughing.  GI: soft, nontender,  nondistended, + BS MS: no deformity or atrophy Skin: warm and dry, no rash Neuro:  Strength and sensation are intact Psych: euthymic mood, full affect   EKG: SR with rate of 70 bpm has artifact.   Recent Labs: 10/08/2018: B Natriuretic Peptide 281.5; BUN 27; Creatinine, Ser 0.98; Magnesium 2.0; Potassium 4.4; Sodium 139; TSH 7.020 27-Oct-2018: Hemoglobin 11.6; Platelets 164    Lipid Panel No results found for: CHOL, TRIG, HDL, CHOLHDL, VLDL, LDLCALC, LDLDIRECT    Wt Readings from Last 3 Encounters:  10/25/18 133 lb 12.8 oz (60.7 kg)  10/08/18 130 lb 1 oz (59 kg)  11/04/17 150 lb (68 kg)    Other studies Reviewed: Echocardiogram 10-27-2018  1. The left ventricle has normal systolic function with an ejection fraction of 60-65%. The cavity size was normal. There is severely increased left ventricular wall thickness. Left ventricular diastology could not be evaluated secondary to atrial  fibrillation.  2. Left atrial size was mildly dilated.  3. The mitral valve is degenerative. Mild thickening of the mitral valve leaflet. Mild calcification of the mitral valve leaflet. There is severe mitral annular calcification present.  4. The tricuspid valve is normal in structure.  5. The aortic valve is tricuspid Severely thickening of the aortic valve Severe calcifcation of the aortic valve.  6. The pulmonic valve was normal in structure.  7. The inferior vena cava was dilated in size with <50% respiratory variability.  ASSESSMENT AND PLAN:  1.  Hypotension: I have completed orthostatic blood pressures in the exam room. He remains hypotensive but is not orthostatic. I will reduce the lisinopril to 5 mg from 10 mg daily. He has had recent labs by PCP, Dr Shelia Media on 10/25/2018. We are requesting those results.    2. PAF: He is now in SR, remains on apixaban. CHADS VASC Score of 3. He denies bleeding or excessive bruising. Will continue follow up.   3. Anemia: Required blood transfusions while  hospitalized. Last Hgb was 11.6 on 27-Oct-2018.   4. Frailty: He has not had any dizziness or falls. This is a concern with hx of anemia, hypotension and taking anticoagulation. He will need to add nutritional drinks to his regimen. PCP following him for this as well.   Current medicines are reviewed at length with the patient today.    Labs/ tests ordered today include: None-will request labs from Dr. Judeen Hammans. [place], ANP, AACC   10/25/2018 2:15 PM    [facility] [address] Office [phone] Fax [phone]

## 2018-11-01 ENCOUNTER — Telehealth: Payer: Self-pay | Admitting: Adult Health

## 2018-11-01 NOTE — Telephone Encounter (Signed)
This is not my patient -- Dr. Harrell Gave is the Attending Cardiologist & was seen by Eugenio Hoes, DNP.  Will defer to them.  Glenetta Hew, MD

## 2018-11-01 NOTE — Telephone Encounter (Signed)
Its okay for her not to check his BP daily now. Unless he is symptomatic, I think he will be find. Maybe once a week would be better.

## 2018-11-01 NOTE — Telephone Encounter (Signed)
Gene Hunter patient's daughter in law is calling because she checks patient's BP everyday, however she still has to go to work in an office she wants to know what is the risk for the patient by her going to his house to check his BP.  Should she not check his BP for now.  He was advised at his last visit to check his BP daily. She stated he doesn't do a good job doing it himself that is the reason she goes over to check it for him.

## 2018-11-01 NOTE — Telephone Encounter (Signed)
Pts daughter, Blanch Media, called to report that sh is anxious about having to check the pts BP daily since she works in a Architect and exposed to many people throughout the day..she dos not have his readings with her but the systolic has been in the 734'L..  She says he has been feeling well.. he did work in the yard the last 2 days but feeling very tired from that today.Marland Kitchen  He also has an appt with Jory Sims NP on 11/22/18 that she is unsure about and nervous about bringing him but also nevous about his BP not being checked daily..  Will forward to Jory Sims NP for her recommendations. She has tried to teach the pt how to check it himself but he is unable to put the cuff on right.

## 2018-11-02 NOTE — Telephone Encounter (Signed)
Returned call to pts daughter, Gene Hunter, informed ok to take and log BP once weekly and if this should become abnormal take again in 10-15 minutes if still abnormal calll and inform office/[place],DNP and we will discuss this at that time

## 2018-11-13 ENCOUNTER — Other Ambulatory Visit: Payer: Self-pay | Admitting: Adult Health

## 2018-11-13 MED ORDER — APIXABAN 2.5 MG PO TABS: 2.5000 mg | ORAL_TABLET | Freq: Two times a day (BID) | ORAL | 5 refills | Status: DC

## 2018-11-13 NOTE — Telephone Encounter (Signed)
° ° ° ° °*  STAT* If patient is at the pharmacy, call can be transferred to refill team.   1. Which medications need to be refilled? (please list name of each medication and dose if known) apixaban (ELIQUIS) 2.5 MG TABS tablet 2. Which pharmacy/location (including street and city if local pharmacy) is medication to be sent to? [facility] [place].  3. Do they need a 30 day or 90 day supply? [place]

## 2018-11-13 NOTE — Telephone Encounter (Signed)
Eliquis refilled.  

## 2018-11-14 ENCOUNTER — Telehealth: Payer: Self-pay

## 2018-11-14 ENCOUNTER — Telehealth: Payer: Self-pay | Admitting: Adult Health

## 2018-11-14 NOTE — Telephone Encounter (Signed)
Do you guys have any samples?   Please advise, thank you!

## 2018-11-14 NOTE — Telephone Encounter (Signed)
Gene Hunter, samples were given, we just need to have the PA completed as soon as you have time.   Thank you so much

## 2018-11-14 NOTE — Telephone Encounter (Signed)
Leaving pt  2 weeks supply of Eliquis 2.5 mg tablet upfront for pt to pick downstairs.

## 2018-11-14 NOTE — Telephone Encounter (Signed)
  Patient calling the office for samples of medication:   1.  What medication and dosage are you requesting samples for?  apixaban (ELIQUIS) 2.5 MG TABS tablet  2.  Are you currently out of this medication?   Yes, he is completely out and is waiting on the PA to go through to get his prescription filled. Per Almyra Free, RN at [facility], patient was asked to contact [employer] office to see if there were samples they could pick up.

## 2018-11-14 NOTE — Telephone Encounter (Signed)
I called the pharmacy, they stated that the Eliquis needs to have a PA completed.   I will contact patient and advise we will work on this, and to try to contact our [address] location of any samples. Hopefully the PA will be completed quickly and approved.

## 2018-11-14 NOTE — Telephone Encounter (Signed)
I called patient, spoke with daughter advised that samples were at [address] office.   Daughter verbalized understanding.

## 2018-11-14 NOTE — Telephone Encounter (Signed)
DIL of Pt called. The pt hasn't been able to get his most recent rx of apixaban (ELIQUIS) 2.5 MG TABS tablet . The pharmacy says the pt needs to call the Dr, because the medication hasn't been approved by the insurance company. The pt was put on Eliquis the first time in the hospital last month.   The Pt will be out of medication by tonight   *STAT* If patient is at the pharmacy, call can be transferred to refill team.   1. Which medications need to be refilled? (please list name of each medication and dose if known)  apixaban (ELIQUIS) 2.5 MG TABS tablet  2. Which pharmacy/location (including street and city if local pharmacy) is medication to be sent to?  [facility]  3. Do they need a 30 day or 90 day supply? 30 with refills

## 2018-11-15 NOTE — Telephone Encounter (Signed)
Unable to do PA . I don't have access to insurance denial. Was not provided with a company or phone #.

## 2018-11-15 NOTE — Telephone Encounter (Signed)
S/w pt he has scale and BP cuff to get vitals before visit. No mychart or email.    In the setting of the current Covid19 crisis, you are scheduled for a telephone(phone or video) visit with your provider on 4-15 (date) at 130pm (time).  Just as we do with many in-office visits, in order for you to participate in this visit, we must obtain consent.  If you'd like, I can send this to your mychart (if signed up) or email for you to review.  Otherwise, I can obtain your verbal consent now.  All virtual visits are billed to your insurance company just like a normal visit would be.  By agreeing to a virtual visit, we'd like you to understand that the technology does not allow for your provider to perform an examination, and thus may limit your provider's ability to fully assess your condition.  Finally, though the technology is pretty good, we cannot assure that it will always work on either your or our end, and in the setting of a video visit, we may have to convert it to a phone-only visit.  In either situation, we cannot ensure that we have a secure connection.  Are you willing to proceed?

## 2018-11-20 ENCOUNTER — Telehealth: Payer: Self-pay | Admitting: Adult Health

## 2018-11-20 NOTE — Telephone Encounter (Signed)
Smart phone/No MyChart/pre reg completed 11-20-18 ST Patient consents to phone visit. 11-20-18 ST

## 2018-11-20 NOTE — Progress Notes (Signed)
Virtual Visit via Telephone Note   This visit type was conducted due to national recommendations for restrictions regarding the COVID-19 Pandemic (e.g. social distancing) in an effort to limit this patient's exposure and mitigate transmission in our community.  Due to his co-morbid illnesses, this patient is at least at moderate risk for complications without adequate follow up.  This format is felt to be most appropriate for this patient at this time.  The patient did not have access to video technology/had technical difficulties with video requiring transitioning to audio format only (telephone).  All issues noted in this document were discussed and addressed.  No physical exam could be performed with this format.  Please refer to the patient's chart for his  consent to telehealth for [facility].   Evaluation Performed:  Follow-up visit  This visit type was conducted due to national recommendations for restrictions regarding the COVID-19 Pandemic (e.g. social distancing).  This format is felt to be most appropriate for this patient at this time.  All issues noted in this document were discussed and addressed.  No physical exam was performed (except for noted visual exam findings with Telehealth visits).  See MyChart message from today for the patient's consent to telehealth for [facility].  Patient has given verbal permission to conduct this visit via virtual appointment and to bill insurance 11/22/2018 1:42 PM     Date:  11/22/2018   ID:  Gene Hunter, DOB 07-22-1926, MRN [id number]  Patient Location:  [facility] [id number]   Provider location:   [place], [place] office   PCP:  [name], MD  Cardiologist:  Buford Dresser, MD  Electrophysiologist:  None   Chief Complaint:  Follow up, BP evaluation.   History of Present Illness:    Gene Hunter is a 85 y.o. male who presents via audio/video conferencing for a telehealth visit today for  ongoing assessment and management of newly diagnosed atrial fibrillation, which was noted during hospitalization March 2020.  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  He was not placed on AV nodal blocking agents due to bradycardia.  Echocardiogram revealed an EF of 60% to 65% with severe thickening and calcification of the aortic valve without stenosis.  On last office visit 10/25/2018 he was complaining of fatigue, he had declined since leaving the [facility] in March.  He had been accompanied by his granddaughter on that office visit and she reported that he uses a walker to the mailbox and be more active but since coming home from the [facility] he has been "lazy".  Orthostatic blood pressures were completed during that last office visit and he was not found to be orthostatic however he was hypotensive and therefore lisinopril was reduced to 5 mg daily from 10 mg daily.  Per EKG he remained in normal sinus rhythm.  He was advised on nutritional drinks to help with energy and calorie intake as he was not eating or drinking well.  His granddaughter was to take his blood pressure once a week.  His daughter in law is with him to discuss how he is feeling as he has trouble hearing over the phone. Blood pressures are a little better with increase of about 10 points from 103/59 to 115/68 today. He is more active. Stays out in the yard for 3-4 hours a day, sometimes more if the weather  allows. He denies any dizziness or fatigue. No bleeding, hemoptysis, or excessive bruising. HR has been well controlled in the 70's.   The patient does not symptoms concerning for COVID-19 infection (fever, chills, cough, or new SHORTNESS OF BREATH).    Prior CV studies:   The following studies were reviewed today:  Echocardiogram 10/09/2018  1. The left ventricle has normal systolic function with an ejection fraction of 60-65%. The cavity size was  normal. There is severely increased left ventricular wall thickness. Left ventricular diastology could not be evaluated secondary to atrial  fibrillation. 2. Left atrial size was mildly dilated. 3. The mitral valve is degenerative. Mild thickening of the mitral valve leaflet. Mild calcification of the mitral valve leaflet. There is severe mitral annular calcification present. 4. The tricuspid valve is normal in structure. 5. The aortic valve is tricuspid Severely thickening of the aortic valve Severe calcifcation of the aortic valve. 6. The pulmonic valve was normal in structure. 7. The inferior vena cava was dilated in size with <50% respiratory variability.   Past Medical History:  Diagnosis Date   Blindness of right eye    BPH (benign prostatic hyperplasia)    Essential hypertension    HLD (hyperlipidemia)    Hypothyroidism    Past Surgical History:  Procedure Laterality Date   bilateral hip replacement     ESOPHAGEAL MANOMETRY N/A 11/21/2017   Procedure: ESOPHAGEAL MANOMETRY (EM);  Surgeon: Carol Ada, MD;  Location: [facility];  Service: Endoscopy;  Laterality: N/A;   HERNIA REPAIR     JOINT REPLACEMENT     right knee replacement       Current Meds  Medication Sig   alfuzosin (UROXATRAL) 10 MG 24 hr tablet Take 10 mg by mouth daily.   apixaban (ELIQUIS) 2.5 MG TABS tablet Take 1 tablet (2.5 mg total) by mouth 2 (two) times daily.   atorvastatin (LIPITOR) 20 MG tablet Take 20 mg by mouth daily.   calcium carbonate (OSCAL) 1500 (600 Ca) MG TABS tablet Take 600 mg of elemental calcium by mouth daily with breakfast.   cholecalciferol (VITAMIN D) 1000 units tablet Take 1,000 Units by mouth daily.   docusate sodium (COLACE) 100 MG capsule Take 100 mg by mouth daily.   levothyroxine (LEVOXYL) 100 MCG tablet Take 100 mcg by mouth daily before breakfast.    lisinopril (PRINIVIL,ZESTRIL) 5 MG tablet Take 1 tablet (5 mg total) by mouth daily.   Multiple  Vitamins-Minerals (MULTIVITAMIN WITH MINERALS) tablet Take 1 tablet by mouth daily.   traMADol (ULTRAM) 50 MG tablet Take 1 tablet (50 mg total) by mouth every 6 (six) hours as needed for severe pain.     Allergies:   Bee venom; Milk-related compounds; Coricidin d cold-flu-sinus [chlorphen-pe-acetaminophen]; and Maxzide [hydrochlorothiazide w-triamterene]   Social History   Tobacco Use   Smoking status: Former Smoker   Smokeless tobacco: Never Used  Substance Use Topics   Alcohol use: No   Drug use: No     Family Hx: The patient's family history includes Heart attack in his father and mother; Heart disease in his father.  ROS:   Please see the history of present illness.    All other systems reviewed and are negative.   Labs/Other Tests and Data Reviewed:    Recent Labs: 10/08/2018: B Natriuretic Peptide 281.5; BUN 27; Creatinine, Ser 0.98; Magnesium 2.0; Potassium 4.4; Sodium 139; TSH 7.020 10/09/2018: Hemoglobin 11.6; Platelets 164   Recent Lipid Panel No results found for: CHOL, TRIG, HDL, CHOLHDL,  LDLCALC, LDLDIRECT  Wt Readings from Last 3 Encounters:  11/22/18 135 lb 3.2 oz (61.3 kg)  10/25/18 133 lb 12.8 oz (60.7 kg)  10/08/18 130 lb 1 oz (59 kg)     Exam:    Vital Signs:  BP 115/68 (BP Location: Left Arm)    Pulse 71    Ht 5\' 11"  (1.803 m)    Wt 135 lb 3.2 oz (61.3 kg)    BMI 18.86 kg/m    Well nourished, well developed male in no acute distress. He is described as having more energy, no shortness of breath or dizziness by his daughter in law. Some bruises on his arms from working in the yard. He is very hard of hearing. He is steady on his feet and uses walker some for ambulation.   ASSESSMENT & PLAN:    1.  Hypertension: BP has increased some with lower dose of lisinopril. I would like him to have a little higher BP to avoid dizziness, especially now that he is out in his yard and in the sun. I will cut back to 2.5 mg daily. His daughter in law is taking  his BP and recording it 3-4 times a week. She is to email me his recorded BP for the next few weeks as we are changing his medications. He will have follow up BMET in one month and been seen again after labs.   2. Atrial fib: He denies racing HR, or bleeding issues on Eliquis. However, Eliquis has not been approved by his Pensions consultant. He had been on Xarelto in the past which was covered. I will change him to Xarelto 15 mg daily. New Rx is sent in. He is to take the Eliquis until he receives the Xarelto. Follow up CBC is ordered.   3.Hypercholesterolemia: He is on atorvastatin. No complaints of myalgia pain. Needs follow up lipids and LFT's with next blood draw.    COVID-19 Education: The signs and symptoms of COVID-19 were discussed with the patient and how to seek care for testing (follow up with PCP or arrange E-visit).  The importance of social distancing was discussed today.  Patient Risk:   After full review of this patients clinical status, I feel that they are at least moderate risk at this time.  Time:   Today, I have spent 10 minutes with the patient with telehealth technology discussing BP, HR and his overall health status with the patient and his daughter in law. Questions from daughter in law are answered.    Medication Adjustments/Labs and Tests Ordered: Current medicines are reviewed at length with the patient today.  Concerns regarding medicines are outlined above.   Tests Ordered: No orders of the defined types were placed in this encounter.   Medication Changes: Decrease lisinopril to 2.5 mg daily, and change Eliquis to Xarelto 15 mg daily as above.   Disposition: One month for BP and status check. If okay, will space out appointments to every 6 months.   Signed, Phill Myron. Gene Pugh, ANP, AACC  11/22/2018 1:17 PM    [facility] [place], [place], [place]  [address] Phone: [phone]; Fax: [phone]

## 2018-11-22 ENCOUNTER — Telehealth (INDEPENDENT_AMBULATORY_CARE_PROVIDER_SITE_OTHER): Payer: Medicare Other | Admitting: Adult Health

## 2018-11-22 VITALS — BP 115/68 | HR 71 | Ht 71.0 in | Wt 135.2 lb

## 2018-11-22 DIAGNOSIS — E7800 Pure hypercholesterolemia, unspecified: Secondary | ICD-10-CM

## 2018-11-22 DIAGNOSIS — I10 Essential (primary) hypertension: Secondary | ICD-10-CM

## 2018-11-22 DIAGNOSIS — Z79899 Other long term (current) drug therapy: Secondary | ICD-10-CM

## 2018-11-22 DIAGNOSIS — I4821 Permanent atrial fibrillation: Secondary | ICD-10-CM

## 2018-11-22 MED ORDER — RIVAROXABAN 15 MG PO TABS: 15.0000 mg | ORAL_TABLET | Freq: Two times a day (BID) | ORAL | 6 refills | Status: DC

## 2018-11-22 MED ORDER — LISINOPRIL 5 MG PO TABS: 2.5000 mg | ORAL_TABLET | Freq: Every day | ORAL | 12 refills | Status: DC

## 2018-11-22 NOTE — Patient Instructions (Addendum)
Medication Instructions:  STOP ELIQUIS START XARELTO 15MG  DAILY DECREASE LISINOPRIL 2.5MG  DAILY (1/2 TAB) If you need a refill on your cardiac medications before your next appointment, please call your pharmacy.  Labwork: BMET,LFT, LIPID AND CBC IN  ONE MONTH (12-22-2018) BEFORE FOLLOW UP APPOINTMENT HERE IN THE OFFICE AT [facility]  You will NOT need to fast   Take the provided lab slips with you to the lab for your blood draw.   When you have your labs (blood work) drawn today and your tests are completely normal, you will receive your results only by MyChart Message (if you have MyChart) -OR-  A paper copy in the mail.  If you have any lab test that is abnormal or we need to change your treatment, we will call you to review these results.  Follow-Up: You will need a follow up appointment in 1 months WITH Jory Sims, DNP, [place] may see Buford Dresser, MD or one of the following Advanced Practice Providers on your designated Care Team:  Rosaria Ferries, PA-C  Jory Sims, DNP, ANP     At [facility], you and your health needs are our priority.  As part of our continuing mission to provide you with exceptional heart care, we have created designated Provider Care Teams.  These Care Teams include your primary Cardiologist (physician) and Advanced Practice Providers (APPs -  Physician Assistants and Nurse Practitioners) who all work together to provide you with the care you need, when you need it.  Thank you for choosing [facility] at [facility]!!

## 2018-11-27 ENCOUNTER — Telehealth: Payer: Self-pay | Admitting: Adult Health

## 2018-11-27 NOTE — Telephone Encounter (Signed)
LM for Gene [facility] to call back [phone].

## 2018-11-27 NOTE — Telephone Encounter (Signed)
1. On 11/25/18 pt's daughter called to report pt's BP  Has gotten lower and the medication was cut in half on : 4/93  stalic was 86 and on Sunday 90 and the 18th 91.    2. Per pharmacy call need some directions on med XARELTO 15 mg  if twice a day may need pre authorization on this medication

## 2018-11-29 ENCOUNTER — Telehealth: Payer: Self-pay | Admitting: Adult Health

## 2018-11-29 MED ORDER — RIVAROXABAN 15 MG PO TABS: 15.0000 mg | ORAL_TABLET | Freq: Every day | ORAL | 6 refills | Status: DC

## 2018-11-29 NOTE — Telephone Encounter (Signed)
New Message            The pharmacy is calling Gene Hunter) is needing clarification for "Eliquis/Xarelto

## 2018-11-29 NOTE — Telephone Encounter (Signed)
Left message for pt to call.

## 2018-11-29 NOTE — Telephone Encounter (Addendum)
Spoke with pt dtr-n-law, aware samples at the front desk for pick-up. She reports his bp is starting to come up some. She will continue to monitor.

## 2018-11-29 NOTE — Telephone Encounter (Signed)
  Daughter in law is returning a call and is also requesting some samples  Patient calling the office for samples of medication:   1.  What medication and dosage are you requesting samples for? Rivaroxaban (XARELTO) 15 MG TABS tablet  2.  Are you currently out of this medication? Is new script was sent to the pharmacy but it will be about a week before he can get it due to needing approval.

## 2018-11-29 NOTE — Telephone Encounter (Signed)
Spoke with pt pharmacy, the [place] script was sent in for twice daily. Confirmed with the pharmacy the xarelto is 15 mg once daily. New script sent to the pharmacy

## 2018-12-11 ENCOUNTER — Other Ambulatory Visit: Payer: Self-pay

## 2018-12-11 DIAGNOSIS — Z79899 Other long term (current) drug therapy: Secondary | ICD-10-CM

## 2018-12-11 DIAGNOSIS — E7800 Pure hypercholesterolemia, unspecified: Secondary | ICD-10-CM

## 2018-12-11 DIAGNOSIS — I10 Essential (primary) hypertension: Secondary | ICD-10-CM

## 2018-12-11 NOTE — Addendum Note (Signed)
Addended by: Waylan Rocher on: 12/11/2018 12:24 PM   Modules accepted: Orders

## 2018-12-12 ENCOUNTER — Telehealth: Payer: Self-pay | Admitting: Cardiology

## 2018-12-12 NOTE — Telephone Encounter (Signed)
Spoke with patient's daughter. States patient will be out of samples on Monday. Waiting for insurance to give approval for the Xarelto so they can pick up from pharmacy. Sheral Apley, RN left samples at [facility] office front desk to pick up if needed. Advised daughter I will call pharmacy and see if prior Josem Kaufmann is needed.

## 2018-12-12 NOTE — Telephone Encounter (Signed)
  Pt c/o medication issue:  1. Name of Medication: Rivaroxaban (XARELTO) 15 MG TABS tablet  2. How are you currently taking this medication (dosage and times per day)? As directed  3. Are you having a reaction (difficulty breathing--STAT)?  No  4. What is your medication issue? Patient is almost out of samples and they need approval from the insurance to be able to get the medicine filled. They have enough left to get through the week.

## 2018-12-12 NOTE — Telephone Encounter (Signed)
Spoke with patient's pharmacy. Pharmacy said Xarelto was approved with a cost of $118.53 a month.  Called patient's daughter back and let her know this information. She said she had called patient's pharmacy Friday and pharmacy was supposed to call them. Stated she will contact pharmacy and pick up patient's medication. She was appreciative.

## 2018-12-29 ENCOUNTER — Telehealth: Payer: Self-pay | Admitting: Adult Health

## 2019-01-02 ENCOUNTER — Telehealth: Payer: Self-pay | Admitting: Cardiology

## 2019-01-02 ENCOUNTER — Telehealth: Payer: Self-pay | Admitting: Adult Health

## 2019-01-02 NOTE — Telephone Encounter (Signed)
error 

## 2019-01-02 NOTE — Telephone Encounter (Signed)
New Message    Blanch Media is calling because she said the Pt went for his labs last week and he wasn't able to get them done.  They rescheduled the virtual visit and said they will go for labs on Friday

## 2019-01-02 NOTE — Telephone Encounter (Signed)
Follow up:   Patient returning your call concering his apt please call patient back.

## 2019-01-02 NOTE — Progress Notes (Deleted)
{Choose 1 Note Type (Telehealth Visit or Telephone Visit):[phone]}   Date:  01/02/2019   ID:  Gene Hunter, DOB 1925-11-11, MRN [id number]  {Patient Location:[phone]::"Home"} {Provider Location:[phone]::"Home"}  PCP:  [name], MD  Cardiologist:  Buford Dresser, MD  Electrophysiologist:  None   Evaluation Performed:  {Choose Visit PPJK:[id number]::"KDXIPJ-AS Visit"}  Chief Complaint:  ***  History of Present Illness:    Gene Hunter is a 85 y.o. male we are following for ongoing assessment and management of newly diagnosed atrial fibrillation which was noted during hospitalization in March 2020.  The patient is also on Xarelto which she had been on in the past for DVT and has a CHADS VAS Score of 3.  The patient also has a history of orthostatic hypotension and lisinopril was reduced to 5 mg daily.  On last virtual office visit the patient was feeling very well and active he did not have any complaints of dizziness, heart racing, or excessive bruising or bleeding.  He was back out in his yard and feeling better and was more active.  The patient {does/does not:200015} have symptoms concerning for COVID-19 infection (fever, chills, cough, or new shortness of breath).    Past Medical History:  Diagnosis Date  . Blindness of right eye   . BPH (benign prostatic hyperplasia)   . Essential hypertension   . HLD (hyperlipidemia)   . Hypothyroidism    Past Surgical History:  Procedure Laterality Date  . bilateral hip replacement    . ESOPHAGEAL MANOMETRY N/A 11/21/2017   Procedure: ESOPHAGEAL MANOMETRY (EM);  Surgeon: Carol Ada, MD;  Location: [facility];  Service: Endoscopy;  Laterality: N/A;  . HERNIA REPAIR    . JOINT REPLACEMENT    . right knee replacement       No outpatient medications have been marked as taking for the 01/03/19 encounter (Appointment) with Lendon Colonel, NP.     Allergies:   Bee venom; Milk-related compounds; Coricidin d  cold-flu-sinus [chlorphen-pe-acetaminophen]; and Maxzide [hydrochlorothiazide w-triamterene]   Social History   Tobacco Use  . Smoking status: Former Research scientist (life sciences)  . Smokeless tobacco: Never Used  Substance Use Topics  . Alcohol use: No  . Drug use: No     Family Hx: The patient's family history includes Heart attack in his father and mother; Heart disease in his father.  ROS:   Please see the history of present illness.    *** All other systems reviewed and are negative.   Prior CV studies:   The following studies were reviewed today: Echocardiogram 10/09/2018 1. The left ventricle has normal systolic function with an ejection fraction of 60-65%. The cavity size was normal. There is severely increased left ventricular wall thickness. Left ventricular diastology could not be evaluated secondary to atrial  fibrillation. 2. Left atrial size was mildly dilated. 3. The mitral valve is degenerative. Mild thickening of the mitral valve leaflet. Mild calcification of the mitral valve leaflet. There is severe mitral annular calcification present. 4. The tricuspid valve is normal in structure. 5. The aortic valve is tricuspid Severely thickening of the aortic valve Severe calcifcation of the aortic valve. 6. The pulmonic valve was normal in structure. 7. The inferior vena cava was dilated in size with <50% respiratory variability.  Labs/Other Tests and Data Reviewed:    EKG:  {EKG/Telemetry Strips Reviewed:[phone]}  Recent Labs: 10/08/2018: B Natriuretic Peptide 281.5; BUN 27; Creatinine, Ser 0.98; Magnesium 2.0; Potassium 4.4; Sodium 139; TSH 7.020 10/09/2018: Hemoglobin 11.6; Platelets 164  Recent Lipid Panel No results found for: CHOL, TRIG, HDL, CHOLHDL, LDLCALC, LDLDIRECT  Wt Readings from Last 3 Encounters:  11/22/18 135 lb 3.2 oz (61.3 kg)  10/25/18 133 lb 12.8 oz (60.7 kg)  10/08/18 130 lb 1 oz (59 kg)     Objective:    Vital Signs:  There were no vitals taken for  this visit.   {HeartCare Virtual Exam (Optional):[phone]::"VITAL SIGNS:  reviewed"}  ASSESSMENT & PLAN:    1. ***  COVID-19 Education: The signs and symptoms of COVID-19 were discussed with the patient and how to seek care for testing (follow up with PCP or arrange E-visit).  ***The importance of social distancing was discussed today.  Time:   Today, I have spent *** minutes with the patient with telehealth technology discussing the above problems.     Medication Adjustments/Labs and Tests Ordered: Current medicines are reviewed at length with the patient today.  Concerns regarding medicines are outlined above.   Tests Ordered: No orders of the defined types were placed in this encounter.   Medication Changes: No orders of the defined types were placed in this encounter.   Disposition:  Follow up {follow up:15908}  Signed, Phill Myron. [place] Pugh, ANP, AACC  01/02/2019 7:51 AM    [facility]

## 2019-01-02 NOTE — Telephone Encounter (Signed)
home phone/ declined my chart/ consent/ pre reg completed  °

## 2019-01-02 NOTE — Telephone Encounter (Signed)
Attempted to call back but phone rings several times and then hangs up.

## 2019-01-03 ENCOUNTER — Telehealth: Payer: Medicare Other | Admitting: Adult Health

## 2019-01-03 NOTE — Telephone Encounter (Signed)
Left message that okay to do labs this week, they need to be drawn prior to his follow up appointment 01-10-19. She will call back with questions.

## 2019-01-05 DIAGNOSIS — Z79899 Other long term (current) drug therapy: Secondary | ICD-10-CM | POA: Diagnosis not present

## 2019-01-05 DIAGNOSIS — I10 Essential (primary) hypertension: Secondary | ICD-10-CM | POA: Diagnosis not present

## 2019-01-05 DIAGNOSIS — E7800 Pure hypercholesterolemia, unspecified: Secondary | ICD-10-CM | POA: Diagnosis not present

## 2019-01-05 LAB — LIPID PANEL
Chol/HDL Ratio: 1.7 ratio (ref 0.0–5.0)
Cholesterol, Total: 105 mg/dL (ref 100–199)
HDL: 63 mg/dL (ref >39)
LDL Calculated: 34 mg/dL (ref 0–99)
Triglycerides: 42 mg/dL (ref 0–149)
VLDL Cholesterol Cal: 8 mg/dL (ref 5–40)

## 2019-01-05 LAB — HEPATIC FUNCTION PANEL
ALT: 36 IU/L (ref 0–44)
AST: 31 IU/L (ref 0–40)
Albumin: 4.2 g/dL (ref 3.5–4.6)
Alkaline Phosphatase: 78 IU/L (ref 39–117)
Bilirubin Total: 0.7 mg/dL (ref 0.0–1.2)
Bilirubin, Direct: 0.27 mg/dL (ref 0.00–0.40)
Total Protein: 6.2 g/dL (ref 6.0–8.5)

## 2019-01-05 LAB — CBC
Hematocrit: 35.8 % — ABNORMAL LOW (ref 37.5–51.0)
Hemoglobin: 11.9 g/dL — ABNORMAL LOW (ref 13.0–17.7)
MCH: 31 pg (ref 26.6–33.0)
MCHC: 33.2 g/dL (ref 31.5–35.7)
MCV: 93 fL (ref 79–97)
Platelets: 212 10*3/uL (ref 150–450)
RBC: 3.84 x10E6/uL — ABNORMAL LOW (ref 4.14–5.80)
RDW: 12.9 % (ref 11.6–15.4)
WBC: 4.8 10*3/uL (ref 3.4–10.8)

## 2019-01-05 LAB — BASIC METABOLIC PANEL
BUN/Creatinine Ratio: 20 (ref 10–24)
BUN: 18 mg/dL (ref 10–36)
CO2: 26 mmol/L (ref 20–29)
Calcium: 9.2 mg/dL (ref 8.6–10.2)
Chloride: 101 mmol/L (ref 96–106)
Creatinine, Ser: 0.89 mg/dL (ref 0.76–1.27)
GFR calc Af Amer: 87 mL/min/{1.73_m2} (ref >59)
GFR calc non Af Amer: 75 mL/min/{1.73_m2} (ref >59)
Glucose: 87 mg/dL (ref 65–99)
Potassium: 4.5 mmol/L (ref 3.5–5.2)
Sodium: 139 mmol/L (ref 134–144)

## 2019-01-08 ENCOUNTER — Telehealth: Payer: Self-pay | Admitting: Adult Health

## 2019-01-08 NOTE — Telephone Encounter (Signed)
Consent/ declined my chart/ home phone/ pre reg completed

## 2019-01-08 NOTE — Progress Notes (Signed)
Error seen by Coletta Memos NP  Signed, Phill Myron. [place] Pugh, ANP, AACC  01/08/2019 3:12 PM    [facility]

## 2019-01-10 ENCOUNTER — Telehealth (INDEPENDENT_AMBULATORY_CARE_PROVIDER_SITE_OTHER): Payer: Medicare Other | Admitting: General Practice

## 2019-01-10 VITALS — BP 104/69 | HR 86 | Ht 72.0 in | Wt 135.4 lb

## 2019-01-10 DIAGNOSIS — I4821 Permanent atrial fibrillation: Secondary | ICD-10-CM

## 2019-01-10 DIAGNOSIS — Z79899 Other long term (current) drug therapy: Secondary | ICD-10-CM

## 2019-01-10 DIAGNOSIS — I10 Essential (primary) hypertension: Secondary | ICD-10-CM

## 2019-01-10 DIAGNOSIS — E7800 Pure hypercholesterolemia, unspecified: Secondary | ICD-10-CM

## 2019-01-10 NOTE — Patient Instructions (Addendum)
  Follow-Up: You will need a follow up appointment in 3 months-04-13-2019 WITH DR CHRISTOPHER.   You may see Buford Dresser, MD or one of the following Advanced Practice Providers on your designated Care Team:  Rosaria Ferries, PA-C Jory Sims, DNP, ANP       Medication Instructions:  NO CHANGES- Your physician recommends that you continue on your current medications as directed. Please refer to the Current Medication list given to you today. If you need a refill on your cardiac medications before your next appointment, please call your pharmacy.  Labwork: CBC-COUPLE WEEKS BEFORE FOLLOW UP APPOINTMENT IN 3 MONTHS HERE IN OUR OFFICE AT [facility]   You will NOT need to fast   Take the provided lab slips with you to the lab for your blood draw.   When you have your labs (blood work) drawn today and your tests are completely normal, you will receive your results only by MyChart Message (if you have MyChart) -OR-  A paper copy in the mail.  If you have any lab test that is abnormal or we need to change your treatment, we will call you to review these results.  At [facility], you and your health needs are our priority.  As part of our continuing mission to provide you with exceptional heart care, we have created designated Provider Care Teams.  These Care Teams include your primary Cardiologist (physician) and Advanced Practice Providers (APPs -  Physician Assistants and Nurse Practitioners) who all work together to provide you with the care you need, when you need it.  Thank you for choosing [facility] at [facility]!!

## 2019-01-10 NOTE — Progress Notes (Signed)
Virtual Visit via Telephone Note   This visit type was conducted due to national recommendations for restrictions regarding the COVID-19 Pandemic (e.g. social distancing) in an effort to limit this patient's exposure and mitigate transmission in our community.  Due to his co-morbid illnesses, this patient is at least at moderate risk for complications without adequate follow up.  This format is felt to be most appropriate for this patient at this time.  The patient did not have access to video technology/had technical difficulties with video requiring transitioning to audio format only (telephone).  All issues noted in this document were discussed and addressed.  No physical exam could be performed with this format.  Please refer to the patient's chart for his  consent to telehealth for [facility].  Evaluation Performed:  Follow-up visit  This visit type was conducted due to national recommendations for restrictions regarding the COVID-19 Pandemic (e.g. social distancing).  This format is felt to be most appropriate for this patient at this time.  All issues noted in this document were discussed and addressed.  No physical exam was performed (except for noted visual exam findings with Video Visits).  Please refer to the patient's chart (MyChart message for video visits and phone note for telephone visits) for the patient's consent to telehealth for [facility]  Date:  01/10/2019   ID:  [name], DOB 1925/09/30, MRN [id number]  Patient Location:  [address] [address]   Provider location:   [address]. [place], [place] [address]  PCP:  [name], MD  Cardiologist:  Buford Dresser, MD  Electrophysiologist:  None   Chief Complaint: follow up   History of Present Illness:    Gene Hunter is a 85 y.o. male who presents via audio/video conferencing for a telehealth visit today.  Patient verified DOB and address.  PMH of new atrial  fibrillation found during recent hospitalization (10/2018), essential hypertension, hypothyroidism, closed left hip fracture, BPH, constipation, history of falls, HLD.  On Xarelto, Eliquis 2.5 not approved by insurance. (01/10/2019-will try to get Eliquis 2.5mg  approved again pt has hx of falls and has fallen at home April 2020)   Echocardiogram 10/09/2018 LVEF 60 to 65% with severe thickening and calcification of aortic valves without stenosis.  EKG 10/25/2018 sinus rhythm 70 bpm.  Last office visit on 10/25/2018 patient was complaining of fatigue, he had declined since leaving the hospital in March.  He was accompanied by his granddaughter.  She reported that he has been walking with a walker and seemed " lazy" since hospital admission.  Orthostatic blood pressures were completed, no orthostatic hypotension noted.  However, due to hypotension lisinopril reduced from 10 mg to 5 mg daily.  Blood pressure improved from 103/59 to 115/68, and according to his daughter-in-law he became more active with yard work.  She endorsed 3 to 4 hours of yard work daily when weather allowed.  Today the patient's daughter is helping the patient with the virtual visit due to his hearing loss.  He endorses 3 to 4 hours of yard work most days of the week.  The daughter reports a fall at the end of April while the patient was indoors in his kitchen.  He denies chest pain, shortness of breath, palpitations, dizziness, lower extremity swelling, hematuria, dark or tarry stools, epistaxis, hemoptysis, excessive bruising, orthopnea, and PND.  Patient misunderstood last lisinopril instructions and has stopped medication.  Blood pressures have been stable, systolic low 976'B to 341'P.  The patient does not have symptoms concerning for COVID-19 infection (fever, chills, cough, or new SHORTNESS OF BREATH).  The patient has been wearing a mask and social distancing when he is out in public and running errands.   Prior CV studies:   The  following studies were reviewed today:  Echocardiogram 10/09/2018   IMPRESSIONS    1. The left ventricle has normal systolic function with an ejection fraction of 60-65%. The cavity size was normal. There is severely increased left ventricular wall thickness. Left ventricular diastology could not be evaluated secondary to atrial  fibrillation.  2. Left atrial size was mildly dilated.  3. The mitral valve is degenerative. Mild thickening of the mitral valve leaflet. Mild calcification of the mitral valve leaflet. There is severe mitral annular calcification present.  4. The tricuspid valve is normal in structure.  5. The aortic valve is tricuspid Severely thickening of the aortic valve Severe calcifcation of the aortic valve.  6. The pulmonic valve was normal in structure.  7. The inferior vena cava was dilated in size with <50% respiratory variability.  EKG 10/25/2018   sinus rhythm 70 bpm.    Past Medical History:  Diagnosis Date  . Blindness of right eye   . BPH (benign prostatic hyperplasia)   . Essential hypertension   . HLD (hyperlipidemia)   . Hypothyroidism    Past Surgical History:  Procedure Laterality Date  . bilateral hip replacement    . ESOPHAGEAL MANOMETRY N/A 11/21/2017   Procedure: ESOPHAGEAL MANOMETRY (EM);  Surgeon: Carol Ada, MD;  Location: [facility];  Service: Endoscopy;  Laterality: N/A;  . HERNIA REPAIR    . JOINT REPLACEMENT    . right knee replacement       Current Meds  Medication Sig  . alfuzosin (UROXATRAL) 10 MG 24 hr tablet Take 10 mg by mouth daily.  Marland Kitchen atorvastatin (LIPITOR) 20 MG tablet Take 20 mg by mouth daily.  . calcium carbonate (OSCAL) 1500 (600 Ca) MG TABS tablet Take 600 mg of elemental calcium by mouth daily with breakfast.  . cholecalciferol (VITAMIN D) 1000 units tablet Take 1,000 Units by mouth daily.  Marland Kitchen docusate sodium (COLACE) 100 MG capsule Take 100 mg by mouth daily.  Marland Kitchen levothyroxine (LEVOXYL) 100 MCG tablet Take 100  mcg by mouth daily before breakfast.   . Multiple Vitamins-Minerals (MULTIVITAMIN WITH MINERALS) tablet Take 1 tablet by mouth daily.  . Rivaroxaban (XARELTO) 15 MG TABS tablet Take 1 tablet (15 mg total) by mouth daily with supper.  . traMADol (ULTRAM) 50 MG tablet Take 1 tablet (50 mg total) by mouth every 6 (six) hours as needed for severe pain.     Allergies:   Bee venom; Milk-related compounds; Coricidin d cold-flu-sinus [chlorphen-pe-acetaminophen]; and Maxzide [hydrochlorothiazide w-triamterene]   Social History   Tobacco Use  . Smoking status: Former Research scientist (life sciences)  . Smokeless tobacco: Never Used  Substance Use Topics  . Alcohol use: No  . Drug use: No     Family Hx: The patient's family history includes Heart attack in his father and mother; Heart disease in his father.  ROS:   Please see the history of present illness.     All other systems reviewed and are negative.   Labs/Other Tests and Data Reviewed:    Recent Labs: 10/08/2018: B Natriuretic Peptide 281.5; Magnesium 2.0; TSH 7.020 01/05/2019: ALT 36; BUN 18; Creatinine, Ser 0.89; Hemoglobin 11.9; Platelets 212; Potassium 4.5; Sodium 139   Recent Lipid Panel Lab Results  Component  Value Date/Time   CHOL 105 01/05/2019 09:51 AM   TRIG 42 01/05/2019 09:51 AM   HDL 63 01/05/2019 09:51 AM   CHOLHDL 1.7 01/05/2019 09:51 AM   LDLCALC 34 01/05/2019 09:51 AM    Wt Readings from Last 3 Encounters:  11/22/18 135 lb 3.2 oz (61.3 kg)  10/25/18 133 lb 12.8 oz (60.7 kg)  10/08/18 130 lb 1 oz (59 kg)     Exam:    Vital Signs:  There were no vitals taken for this visit.   Well nourished, well developed male in no  acute distress.   ASSESSMENT & PLAN:    1.  Atrial fibrillation Continue Xarelto 15 mg daily Will try her prior authorization Eliquis 2.5 due to falls. Monitor home blood pressure and heart rate Order CBC to follow hemoglobin trend prior to next visit.  2.  Essential hypertension Continue to monitor blood  pressures at home. Continue physical activity as tolerated  3.  Hypercholesterolemia Continue atorvastatin 20 mg daily  4.  Medication management Will try prior evaluation Eliquis 2.5 due to falls.  COVID-19 Education: The signs and symptoms of COVID-19 were discussed with the patient and how to seek care for testing (follow up with PCP or arrange E-visit).  The importance of social distancing was discussed today.  Patient Risk:   After full review of this patients clinical status, I feel that they are at least moderate risk at this time.  Time:   Today, I have spent 12 minutes with the patient with telehealth technology discussing atrial fibrillation, hypertension, blood thinning medication.     Medication Adjustments/Labs and Tests Ordered: Current medicines are reviewed at length with the patient today.  Concerns regarding medicines are outlined above.   Tests Ordered: No orders of the defined types were placed in this encounter.  Medication Changes: No orders of the defined types were placed in this encounter.   Disposition:  in 3 month(s) with Dr. Harrell Gave  Signed, Deberah Pelton, NP  01/10/2019 8:37 AM    [facility]

## 2019-02-01 ENCOUNTER — Ambulatory Visit
Admission: EM | Admit: 2019-02-01 | Discharge: 2019-02-01 | Disposition: A | Discharge status: Home / Self Care | Payer: Medicare Other | Attending: Family Medicine | Admitting: Family Medicine

## 2019-02-01 ENCOUNTER — Other Ambulatory Visit: Payer: Self-pay

## 2019-02-01 ENCOUNTER — Encounter: Payer: Self-pay | Admitting: Emergency Medicine

## 2019-02-01 DIAGNOSIS — S61412A Laceration without foreign body of left hand, initial encounter: Secondary | ICD-10-CM | POA: Diagnosis not present

## 2019-02-01 DIAGNOSIS — S60511A Abrasion of right hand, initial encounter: Secondary | ICD-10-CM | POA: Diagnosis not present

## 2019-02-01 DIAGNOSIS — W19XXXA Unspecified fall, initial encounter: Secondary | ICD-10-CM

## 2019-02-01 DIAGNOSIS — S0081XA Abrasion of other part of head, initial encounter: Secondary | ICD-10-CM

## 2019-02-01 NOTE — ED Triage Notes (Signed)
Pt presents to [facility] for assessment after having a trip and fall this morning in the middle of the street waking back from his neighbors house this morning.  Abrasions noted to nose, bilateral knees.  Laceration/avulsion noted to left hand.  Abrasions to right.

## 2019-02-01 NOTE — ED Notes (Signed)
Provider made aware of facial injury and patient is on blood thinners (Xarelto).

## 2019-02-01 NOTE — ED Notes (Signed)
Patient able to ambulate independently  

## 2019-02-01 NOTE — ED Notes (Signed)
Dressing applied to area (non-adherent and coban)

## 2019-02-01 NOTE — ED Provider Notes (Signed)
[name]   [id number] 02/01/19 Arrival Time: [name]:  1. Laceration of left hand without foreign body, initial encounter   2. Facial abrasion, initial encounter   3. Hand abrasion, right, initial encounter     Normal neurologic exam. No suspicion for ICH, SAH, or skull fracture. No indication for neurodiagnostic imaging at this time.  Procedure: Verbal consent obtained. Patient provided with risks and alternatives to the procedure. Wound copiously irrigated with NS then cleansed with betadine. Local anesthesia: Lidocaine 1% with epinephrine. Wound carefully explored. No foreign body, tendon injury, or nonviable tissue were noted. Using sterile technique, 5 interrupted 5-0 Prolene sutures were placed to reapproximate the wound. Procedure tolerated well. No complications. Minimal bleeding. Advised to look for and return for any signs of infection such as redness, swelling, discharge, or worsening pain. Return for suture removal in 7-10 days.   Discharge Instructions     Please seek prompt medical care if:  You have: ? A very bad (severe) headache that is not helped by medicine. ? Trouble walking or weakness in your arms and legs. ? Clear or bloody fluid coming from your nose or ears. ? Changes in your seeing (vision). ? Jerky movements that you cannot control (seizure).  You throw up (vomit).  Your symptoms get worse.  You lose balance.  Your speech is slurred.  You pass out.  You are sleepier and have trouble staying awake.  The black centers of your eyes (pupils) change in size.  These symptoms may be an emergency. Do not wait to see if the symptoms will go away. Get medical help right away. Call your local emergency services. Do not drive yourself to the hospital.    Will use OTC analgesics as needed for discomfort.  See AVS for head injury discharge precautions.  Reviewed expectations re: course of current medical issues. Questions  answered. Outlined signs and symptoms indicating need for more acute intervention. Patient verbalized understanding. After Visit Summary given.  SUBJECTIVE: History from: patient. Gene Hunter is a 85 y.o. male who presents with complaint of a fall while walking down his street. Braced himself with his hands with resulting abrasions and laceration to his right ulnar palm. Minimal bleeding; controlled. Did scrape his face on the ground. No LOC. Ambulatory since fall. No extremity sensation changes or weakness. No associated n/v. No visual changes. Normal bowel and bladder habits. OTC treatment: none  Denies: confusion, difficulty concentrating, dizziness, headache, paresthesias, ringing in ears, sensitivity to light and noise, slurred speech, vomiting and weakness. History of head injury or concussion: no.  ROS: As per HPI. All other systems negative.  OBJECTIVE:  Vitals:   02/01/19 1511  BP: 117/67  Pulse: 69  Resp: 18  Temp: (!) 97.4 F (36.3 C)  TempSrc: Oral  SpO2: 100%     Glascow Coma Scale: 15  General appearance: alert; no distress HEENT: normocephalic; atraumatic; conjunctivae normal; TMs normal; oral mucosa normal Neck: supple with FROM; no midline cervical tenderness or deformity; no anterior mass or crepitus; trachea midline Lungs: clear to auscultation bilaterally Heart: regular rate and rhythm Abdomen: soft, non-tender; no bruising Back: no midline tenderness Extremities: moves all extremities normally; no cyanosis or edema; symmetrical with no gross deformities Skin: warm and dry; approx 2 cm curved laceration of ulnar right palm; no active bleeding; all fingers of right hand with FROM and normal distal sensation Neurologic: normal gait; CN 2-12 grossly intact Psychological: alert and cooperative; normal mood and  affect  No results found.  Allergies  Allergen Reactions  . Bee Venom Swelling and Other (See Comments)    Reaction:  All over body swelling    . Milk-Related Compounds Rash  . Coricidin D Cold-Flu-Sinus [Chlorphen-Pe-Acetaminophen]   . Maxzide [Hydrochlorothiazide W-Triamterene] Palpitations   Past Medical History:  Diagnosis Date  . Blindness of right eye   . BPH (benign prostatic hyperplasia)   . Essential hypertension   . HLD (hyperlipidemia)   . Hypothyroidism    Past Surgical History:  Procedure Laterality Date  . bilateral hip replacement    . ESOPHAGEAL MANOMETRY N/A 11/21/2017   Procedure: ESOPHAGEAL MANOMETRY (EM);  Surgeon: Carol Ada, MD;  Location: [facility];  Service: Endoscopy;  Laterality: N/A;  . HERNIA REPAIR    . JOINT REPLACEMENT    . right knee replacement     Family History  Problem Relation Age of Onset  . Heart disease Father   . Heart attack Father        in his 72's to 55's  . Heart attack Mother        in her 66's         Vanessa Kick, MD 02/01/19 [id number]

## 2019-02-08 NOTE — Telephone Encounter (Signed)
Open n error °

## 2019-02-09 ENCOUNTER — Other Ambulatory Visit: Payer: Self-pay

## 2019-02-09 ENCOUNTER — Ambulatory Visit
Admission: EM | Admit: 2019-02-09 | Discharge: 2019-02-09 | Disposition: A | Discharge status: Home / Self Care | Payer: Medicare Other | Attending: Emergency Medicine | Admitting: Emergency Medicine

## 2019-02-09 DIAGNOSIS — W57XXXA Bitten or stung by nonvenomous insect and other nonvenomous arthropods, initial encounter: Secondary | ICD-10-CM | POA: Diagnosis not present

## 2019-02-09 DIAGNOSIS — Z4802 Encounter for removal of sutures: Secondary | ICD-10-CM | POA: Diagnosis not present

## 2019-02-09 DIAGNOSIS — T148XXA Other injury of unspecified body region, initial encounter: Secondary | ICD-10-CM

## 2019-02-09 DIAGNOSIS — T07XXXA Unspecified multiple injuries, initial encounter: Secondary | ICD-10-CM | POA: Diagnosis not present

## 2019-02-09 MED ORDER — DOXYCYCLINE HYCLATE 100 MG PO CAPS: 100.0000 mg | ORAL_CAPSULE | Freq: Two times a day (BID) | ORAL | 0 refills | Status: DC

## 2019-02-09 NOTE — ED Provider Notes (Signed)
[facility]    CSN: [id number] Arrival date & time: 02/09/19  1416     History   Chief Complaint Chief Complaint  Patient presents with  . Suture / Staple Removal  . Tick Removal    HPI Gene Hunter is a 85 y.o. male.   Gene Hunter presents with family with complaints of tick bites to right lower leg as well as left lower leg, as well as suture removal to wound to left palm of hand. He fell 6/25 with resulting bruising and skin tears, which are healing. Daughter in law noted the ticks today. Redness to right lower leg. No other rash or fevers. Unknown how long they have been present. No pain. He is ambulatory without difficulty. No fever. He had sutures placed to left palm here on 6/25.     ROS per HPI, negative if not otherwise mentioned.      Past Medical History:  Diagnosis Date  . Blindness of right eye   . BPH (benign prostatic hyperplasia)   . Essential hypertension   . HLD (hyperlipidemia)   . Hypothyroidism     Patient Active Problem List   Diagnosis Date Noted  . Atrial fibrillation ([place]) 10/08/2018  . Closed fracture of left hip ([place]) 09/04/2016  . Constipation 09/04/2016  . Fall 09/04/2016  . BPH (benign prostatic hyperplasia) 09/04/2016  . Essential hypertension   . HLD (hyperlipidemia)   . Hypothyroidism     Past Surgical History:  Procedure Laterality Date  . bilateral hip replacement    . ESOPHAGEAL MANOMETRY N/A 11/21/2017   Procedure: ESOPHAGEAL MANOMETRY (EM);  Surgeon: Carol Ada, MD;  Location: [facility];  Service: Endoscopy;  Laterality: N/A;  . HERNIA REPAIR    . JOINT REPLACEMENT    . right knee replacement         Home Medications    Prior to Admission medications   Medication Sig Start Date End Date Taking? Authorizing Provider  alfuzosin (UROXATRAL) 10 MG 24 hr tablet Take 10 mg by mouth daily. 09/25/18   [provider]  atorvastatin (LIPITOR) 20 MG tablet Take 20 mg by mouth daily.     [provider]  calcium carbonate (OSCAL) 1500 (600 Ca) MG TABS tablet Take 600 mg of elemental calcium by mouth daily with breakfast.    [provider]  cholecalciferol (VITAMIN D) 1000 units tablet Take 1,000 Units by mouth daily.    [provider]  docusate sodium (COLACE) 100 MG capsule Take 100 mg by mouth daily.    [provider]  doxycycline (VIBRAMYCIN) 100 MG capsule Take 1 capsule (100 mg total) by mouth 2 (two) times daily. 02/09/19   Zigmund Gottron, NP  levothyroxine (LEVOXYL) 100 MCG tablet Take 100 mcg by mouth daily before breakfast.     [provider]  Multiple Vitamins-Minerals (MULTIVITAMIN WITH MINERALS) tablet Take 1 tablet by mouth daily.    [provider]  Rivaroxaban (XARELTO) 15 MG TABS tablet Take 1 tablet (15 mg total) by mouth daily with supper. 11/29/18   Lendon Colonel, NP  traMADol (ULTRAM) 50 MG tablet Take 1 tablet (50 mg total) by mouth every 6 (six) hours as needed for severe pain. 11/04/17   Dalia Heading, PA-C    Family History Family History  Problem Relation Age of Onset  . Heart disease Father   . Heart attack Father        in his 69's to 55's  . Heart  attack Mother        in her 1's    Social History Social History   Tobacco Use  . Smoking status: Former Research scientist (life sciences)  . Smokeless tobacco: Never Used  Substance Use Topics  . Alcohol use: No  . Drug use: No     Allergies   Bee venom, Milk-related compounds, Coricidin d cold-flu-sinus [chlorphen-pe-acetaminophen], and Maxzide [hydrochlorothiazide w-triamterene]   Review of Systems Review of Systems   Physical Exam Triage Vital Signs ED Triage Vitals  Enc Vitals Group     BP 02/09/19 1424 103/61     Pulse Rate 02/09/19 1424 75     Resp 02/09/19 1424 16     Temp 02/09/19 1424 98 F (36.7 C)     Temp Source 02/09/19 1424 Oral     SpO2 02/09/19 1424 98 %     Weight --      Height --      Head Circumference --       Peak Flow --      Pain Score 02/09/19 1432 0     Pain Loc --      Pain Edu? --      Excl. in [place]? --    No data found.  Updated Vital Signs BP 103/61 (BP Location: Left Arm)   Pulse 75   Temp 98 F (36.7 C) (Oral)   Resp 16   SpO2 98%    Physical Exam Vitals signs reviewed.  HENT:     Head: Normocephalic and atraumatic.  Cardiovascular:     Rate and Rhythm: Normal rate.  Skin:         Comments: Multiple phases of healing bruising scattered to arms, knees and trunk; yellow to right knee without pain or swelling; scattered scabbing noted as well  Neurological:     Mental Status: He is alert.      UC Treatments / Results  Labs (all labs ordered are listed, but only abnormal results are displayed) Labs Reviewed - No data to display  EKG   Radiology No results found.  Procedures Procedures (including critical care time)  Medications Ordered in UC Medications - No data to display  Initial Impression / Assessment and Plan / UC Course  I have reviewed the triage vital signs and the nursing notes.  Pertinent labs & imaging results that were available during my care of the patient were reviewed by me and considered in my medical decision making (see chart for details).     Ticks removed; right lower leg with surrounding redness. Unknown how long they were embedded. Doxy provided. Wound care discussed.  Final Clinical Impressions(s) / UC Diagnoses   Final diagnoses:  Tick bite with subsequent removal of tick  Visit for suture removal  Multiple bruises     Discharge Instructions     Cleanse affected areas with soap and water daily.  Complete course of antibiotics.  Please return for any worsening of symptoms- increased redness, pain, rash, fevers, pus or drainage or otherwise worsening     ED Prescriptions    Medication Sig Dispense Auth. Provider   doxycycline (VIBRAMYCIN) 100 MG capsule Take 1 capsule (100 mg total) by mouth 2 (two) times daily. 20  capsule Zigmund Gottron, NP     Controlled Substance Prescriptions [place] Controlled Substance Registry consulted? Not Applicable   Zigmund Gottron, NP 02/09/19 1458

## 2019-02-09 NOTE — ED Triage Notes (Signed)
Removed 4 sutures from lt hand. No redness or drainage noted. Pt also has a tick in bedded in his skin to rt ankle area

## 2019-02-09 NOTE — Discharge Instructions (Signed)
Cleanse affected areas with soap and water daily.  Complete course of antibiotics.  Please return for any worsening of symptoms- increased redness, pain, rash, fevers, pus or drainage or otherwise worsening

## 2019-02-19 DIAGNOSIS — E7800 Pure hypercholesterolemia, unspecified: Secondary | ICD-10-CM | POA: Diagnosis not present

## 2019-02-19 DIAGNOSIS — E039 Hypothyroidism, unspecified: Secondary | ICD-10-CM | POA: Diagnosis not present

## 2019-02-19 DIAGNOSIS — I4891 Unspecified atrial fibrillation: Secondary | ICD-10-CM | POA: Diagnosis not present

## 2019-02-19 DIAGNOSIS — I10 Essential (primary) hypertension: Secondary | ICD-10-CM | POA: Diagnosis not present

## 2019-02-19 DIAGNOSIS — E119 Type 2 diabetes mellitus without complications: Secondary | ICD-10-CM | POA: Diagnosis not present

## 2019-02-19 DIAGNOSIS — B0089 Other herpesviral infection: Secondary | ICD-10-CM | POA: Diagnosis not present

## 2019-04-13 ENCOUNTER — Ambulatory Visit: Payer: POS | Admitting: Cardiology

## 2019-04-23 DIAGNOSIS — E039 Hypothyroidism, unspecified: Secondary | ICD-10-CM | POA: Diagnosis not present

## 2019-04-27 ENCOUNTER — Telehealth: Payer: Self-pay

## 2019-04-27 ENCOUNTER — Telehealth (INDEPENDENT_AMBULATORY_CARE_PROVIDER_SITE_OTHER): Payer: Medicare Other | Admitting: Cardiology

## 2019-04-27 ENCOUNTER — Encounter: Payer: Self-pay | Admitting: Cardiology

## 2019-04-27 DIAGNOSIS — I480 Paroxysmal atrial fibrillation: Secondary | ICD-10-CM

## 2019-04-27 DIAGNOSIS — I998 Other disorder of circulatory system: Secondary | ICD-10-CM

## 2019-04-27 DIAGNOSIS — Z7189 Other specified counseling: Secondary | ICD-10-CM | POA: Diagnosis not present

## 2019-04-27 NOTE — Telephone Encounter (Signed)
Called pt for virtual appointment scheduled with Dr. Harrell Gave today 9/18. Pt was unsure of his BP reading on the machine and voiced he doesn't know what medication he take as his daughter in law prepares his meds for him. Nurse called daughter in law and left message to call back and go over medications.

## 2019-04-27 NOTE — Patient Instructions (Signed)

## 2019-04-27 NOTE — Progress Notes (Signed)
Virtual Visit via Telephone Note   This visit type was conducted due to national recommendations for restrictions regarding the COVID-19 Pandemic (e.g. social distancing) in an effort to limit this patient's exposure and mitigate transmission in our community.  Due to his co-morbid illnesses, this patient is at least at moderate risk for complications without adequate follow up.  This format is felt to be most appropriate for this patient at this time.  The patient did not have access to video technology/had technical difficulties with video requiring transitioning to audio format only (telephone).  All issues noted in this document were discussed and addressed.  No physical exam could be performed with this format.  Please refer to the patient's chart for his  consent to telehealth for [facility].   Date:  04/27/2019   ID:  Gene Hunter, DOB 01/15/1926, MRN [id number]  Patient Location: Home Provider Location: Home  PCP:  [name], MD  Cardiologist:  Buford Dresser, MD  Electrophysiologist:  None   Evaluation Performed:  Follow-Up Visit  Chief Complaint:  Follow up  History of Present Illness:    Gene Hunter is a 85 y.o. 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  The patient does not have symptoms concerning for COVID-19 infection (fever, chills, cough, or new shortness of breath).   He is hard of hearing over the phone. He has three numbers written down but doesn't know what they are for--he thinks they might be blood pressure. His daughter in law comes every afternoon to take his BP and load his medications. He will ask his daughter in law to call us with blood pressure numbers.   He overall feels well. He denies bruising or bleeding. No falls, no lightheadedness. Denies chest pain, shortness of breath at rest or with normal exertion. No PND, orthopnea, LE edema or  unexpected weight gain. No syncope or palpitations. Easy fatigue but that is chronic.  Overall says he feels good for 85 years old. He did see his PCP last week (I cannot see in the chart) and was told everything was fine.    Past Medical History:  Diagnosis Date   Blindness of right eye    BPH (benign prostatic hyperplasia)    Essential hypertension    HLD (hyperlipidemia)    Hypothyroidism    Past Surgical History:  Procedure Laterality Date   bilateral hip replacement     ESOPHAGEAL MANOMETRY N/A 11/21/2017   Procedure: ESOPHAGEAL MANOMETRY (EM);  Surgeon: Carol Ada, MD;  Location: [facility];  Service: Endoscopy;  Laterality: N/A;   HERNIA REPAIR     JOINT REPLACEMENT     right knee replacement       No outpatient medications have been marked as taking for the 04/27/19 encounter (Telemedicine) with Buford Dresser, MD.     Allergies:   Bee venom, Milk-related compounds, Coricidin d cold-flu-sinus [chlorphen-pe-acetaminophen], and Maxzide [hydrochlorothiazide w-triamterene]   Social History   Tobacco Use   Smoking status: Former Smoker   Smokeless tobacco: Never Used  Substance Use Topics   Alcohol use: No   Drug use: No     Family Hx: The patient's family history includes Heart attack in his father and mother; Heart disease in his father.  ROS:   Please see the history of present illness.    Constitutional: Negative for chills, fever, night sweats, unintentional weight loss  Respiratory: Negative for cough, sputum, wheezing.  Cardiovascular: See HPI. Gastrointestinal: Negative for abdominal pain, melena, and hematochezia.  Genitourinary: Negative for dysuria and hematuria.  Musculoskeletal: Negative for falls and myalgias.  Neurological: Negative for focal weakness, focal sensory changes and loss of consciousness.  All other systems reviewed and are negative.   Prior CV studies:   The following studies were reviewed  today: Echocardiogram 10/09/2018  1. The left ventricle has normal systolic function with an ejection fraction of 60-65%. The cavity size was normal. There is severely increased left ventricular wall thickness. Left ventricular diastology could not be evaluated secondary to atrial  fibrillation. 2. Left atrial size was mildly dilated. 3. The mitral valve is degenerative. Mild thickening of the mitral valve leaflet. Mild calcification of the mitral valve leaflet. There is severe mitral annular calcification present. 4. The tricuspid valve is normal in structure. 5. The aortic valve is tricuspid Severely thickening of the aortic valve Severe calcifcation of the aortic valve. 6. The pulmonic valve was normal in structure. 7. The inferior vena cava was dilated in size with <50% respiratory variability.  Labs/Other Tests and Data Reviewed:    EKG:  An ECG dated 10/25/18 was personally reviewed today and demonstrated:  SR, artifact  Recent Labs: 10/08/2018: B Natriuretic Peptide 281.5; Magnesium 2.0; TSH 7.020 01/05/2019: ALT 36; BUN 18; Creatinine, Ser 0.89; Hemoglobin 11.9; Platelets 212; Potassium 4.5; Sodium 139   Recent Lipid Panel Lab Results  Component Value Date/Time   CHOL 105 01/05/2019 09:51 AM   TRIG 42 01/05/2019 09:51 AM   HDL 63 01/05/2019 09:51 AM   CHOLHDL 1.7 01/05/2019 09:51 AM   LDLCALC 34 01/05/2019 09:51 AM    Wt Readings from Last 3 Encounters:  01/10/19 135 lb 7.2 oz (61.4 kg)  11/22/18 135 lb 3.2 oz (61.3 kg)  10/25/18 133 lb 12.8 oz (60.7 kg)     Objective:    Vital Signs:  There were no vitals taken for this visit.  Hard of hearing Speaking comfortably on the phone Alert and oriented No acute distress  ASSESSMENT & PLAN:    Paroxysmal atrial fibrillation: asymptomatic. Cannot tell when he is in/out of rhythm. Last visit was sinus -chadsvasc=3, doing well on rivaroxaban -no falls or bleeding -has prior history of anemia. Per his report, PCP told him  last week everything was find  Fluctuating BP: was high in the past, and then low on post-hospitalization follow up. He has his BP checked daily but does not know the values. He will have his daughter in law call us with numbers.  COVID-19 Education: The signs and symptoms of COVID-19 were discussed with the patient and how to seek care for testing (follow up with PCP or arrange E-visit).  The importance of social distancing was discussed today.  Time:   Today, I have spent 15 minutes with the patient with telehealth technology discussing the above problems.    Patient Instructions  Medication Instructions:  Your Physician recommend you continue on your current medication as directed.    If you need a refill on your cardiac medications before your next appointment, please call your pharmacy.   Lab work: None  Testing/Procedures: None  Follow-Up: At [facility], you and your health needs are our priority.  As part of our continuing mission to provide you with exceptional heart care, we have created designated Provider Care Teams.  These Care Teams include your primary Cardiologist (physician) and Advanced Practice Providers (APPs -  Physician Assistants and Nurse Practitioners) who all work together to  provide you with the care you need, when you need it. You will need a follow up appointment in 3 months.  Please call our office 2 months in advance to schedule this appointment.  You may see Buford Dresser, MD or one of the following Advanced Practice Providers on your designated Care Team:   Rosaria Ferries, PA-C  Jory Sims, DNP, ANP     Follow Up:  3 mos or sooner PRN  Signed, Buford Dresser, MD  04/27/2019 11:08 AM    [place]

## 2019-05-10 DIAGNOSIS — E119 Type 2 diabetes mellitus without complications: Secondary | ICD-10-CM | POA: Diagnosis not present

## 2019-05-10 DIAGNOSIS — E7800 Pure hypercholesterolemia, unspecified: Secondary | ICD-10-CM | POA: Diagnosis not present

## 2019-05-10 DIAGNOSIS — E039 Hypothyroidism, unspecified: Secondary | ICD-10-CM | POA: Diagnosis not present

## 2019-05-10 DIAGNOSIS — N400 Enlarged prostate without lower urinary tract symptoms: Secondary | ICD-10-CM | POA: Diagnosis not present

## 2019-05-10 DIAGNOSIS — I4891 Unspecified atrial fibrillation: Secondary | ICD-10-CM | POA: Diagnosis not present

## 2019-05-10 DIAGNOSIS — I10 Essential (primary) hypertension: Secondary | ICD-10-CM | POA: Diagnosis not present

## 2019-07-11 DIAGNOSIS — E559 Vitamin D deficiency, unspecified: Secondary | ICD-10-CM | POA: Diagnosis not present

## 2019-07-11 DIAGNOSIS — I4891 Unspecified atrial fibrillation: Secondary | ICD-10-CM | POA: Diagnosis not present

## 2019-07-11 DIAGNOSIS — Z86718 Personal history of other venous thrombosis and embolism: Secondary | ICD-10-CM | POA: Diagnosis not present

## 2019-07-11 DIAGNOSIS — E039 Hypothyroidism, unspecified: Secondary | ICD-10-CM | POA: Diagnosis not present

## 2019-07-11 DIAGNOSIS — E785 Hyperlipidemia, unspecified: Secondary | ICD-10-CM | POA: Diagnosis not present

## 2019-07-11 DIAGNOSIS — E119 Type 2 diabetes mellitus without complications: Secondary | ICD-10-CM | POA: Diagnosis not present

## 2019-07-11 DIAGNOSIS — I10 Essential (primary) hypertension: Secondary | ICD-10-CM | POA: Diagnosis not present

## 2019-07-26 ENCOUNTER — Telehealth: Payer: Self-pay | Admitting: Cardiology

## 2019-07-26 NOTE — Telephone Encounter (Signed)
Informed patient's daughter-n-law that it's fine to come with patient since he is confused. Will put message in appointment notes.

## 2019-07-26 NOTE — Telephone Encounter (Signed)
Gene Hunter would like to come with patient tomorrow to his appt as he gets confused.

## 2019-07-27 ENCOUNTER — Other Ambulatory Visit: Payer: Self-pay

## 2019-07-27 ENCOUNTER — Encounter: Payer: Self-pay | Admitting: Cardiology

## 2019-07-27 ENCOUNTER — Ambulatory Visit (INDEPENDENT_AMBULATORY_CARE_PROVIDER_SITE_OTHER): Payer: Medicare Other | Admitting: Cardiology

## 2019-07-27 VITALS — BP 125/70 | HR 66 | Ht 72.0 in | Wt 144.2 lb

## 2019-07-27 DIAGNOSIS — I358 Other nonrheumatic aortic valve disorders: Secondary | ICD-10-CM

## 2019-07-27 DIAGNOSIS — I480 Paroxysmal atrial fibrillation: Secondary | ICD-10-CM | POA: Diagnosis not present

## 2019-07-27 DIAGNOSIS — I998 Other disorder of circulatory system: Secondary | ICD-10-CM

## 2019-07-27 DIAGNOSIS — E7800 Pure hypercholesterolemia, unspecified: Secondary | ICD-10-CM | POA: Diagnosis not present

## 2019-07-27 DIAGNOSIS — Z7189 Other specified counseling: Secondary | ICD-10-CM | POA: Diagnosis not present

## 2019-07-27 NOTE — Patient Instructions (Signed)

## 2019-07-27 NOTE — Progress Notes (Signed)
Cardiology Office Note:    Date:  07/27/2019   ID:  Gene Hunter, DOB 1926/07/14, MRN [id number]  PCP:  Gene Pretty, MD  Cardiologist:  Buford Dresser, MD  Referring MD: Gene Pretty, MD   CC: follow up  History of Present Illness:    Gene Hunter is a 85 y.o. 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  Today: Overall feels well. Reviewed his medications today. No falls, no loss of consciousness. Energy is unchanged. He says he feels as good as he could hope for 85 years old. No other concerns today.  Denies chest pain, shortness of breath at rest or with normal exertion. No PND, orthopnea, LE edema or unexpected weight gain. No syncope or palpitations. No issues with bleeding. No falls.   Past Medical History:  Diagnosis Date  . Blindness of right eye   . BPH (benign prostatic hyperplasia)   . Essential hypertension   . HLD (hyperlipidemia)   . Hypothyroidism     Past Surgical History:  Procedure Laterality Date  . bilateral hip replacement    . ESOPHAGEAL MANOMETRY N/A 11/21/2017   Procedure: ESOPHAGEAL MANOMETRY (EM);  Surgeon: Carol Ada, MD;  Location: [facility];  Service: Endoscopy;  Laterality: N/A;  . HERNIA REPAIR    . JOINT REPLACEMENT    . right knee replacement      Current Medications: Current Outpatient Medications on File Prior to Visit  Medication Sig  . alfuzosin (UROXATRAL) 10 MG 24 hr tablet Take 10 mg by mouth daily.  Marland Kitchen atorvastatin (LIPITOR) 20 MG tablet Take 20 mg by mouth daily.  . calcium carbonate (OSCAL) 1500 (600 Ca) MG TABS tablet Take 600 mg of elemental calcium by mouth daily with breakfast.  . cholecalciferol (VITAMIN D) 1000 units tablet Take 1,000 Units by mouth daily.  Marland Kitchen docusate sodium (COLACE) 100 MG capsule Take 100 mg by mouth daily.  Marland Kitchen ELIQUIS 2.5 MG TABS tablet 5 mg 2 (two) times daily.  Marland Kitchen levothyroxine (SYNTHROID) 100 MCG tablet Take  120 mcg by mouth daily before breakfast. Increase to 120 mcg  . Multiple Vitamins-Minerals (MULTIVITAMIN WITH MINERALS) tablet Take 1 tablet by mouth daily.  . polyethylene glycol powder (GLYCOLAX/MIRALAX) 17 GM/SCOOP powder Take by mouth as needed.  . traMADol (ULTRAM) 50 MG tablet Take 1 tablet (50 mg total) by mouth every 6 (six) hours as needed for severe pain.   No current facility-administered medications on file prior to visit.     Allergies:   Bee venom, Milk-related compounds, Coricidin d cold-flu-sinus [chlorphen-pe-acetaminophen], and Maxzide [hydrochlorothiazide w-triamterene]   Social History   Tobacco Use  . Smoking status: Former Research scientist (life sciences)  . Smokeless tobacco: Never Used  Substance Use Topics  . Alcohol use: No  . Drug use: No    Family History: family history includes Heart attack in his father and mother; Heart disease in his father.  ROS:   Please see the history of present illness.  Additional pertinent ROS: Constitutional: Negative for chills, fever, night sweats, unintentional weight loss  HENT: Negative for ear pain, positive for chronic hearing loss.   Eyes: Negative for loss of vision and eye pain.  Respiratory: Negative for cough, sputum, wheezing.   Cardiovascular: See HPI. Gastrointestinal: Negative for abdominal pain, melena, and hematochezia.  Genitourinary: Negative for dysuria and hematuria.  Musculoskeletal: Negative for falls and myalgias.  Skin: Negative for itching and rash.  Neurological: Negative for focal weakness, focal sensory  changes and loss of consciousness.  Endo/Heme/Allergies: Does not bruise/bleed easily.     EKGs/Labs/Other Studies Reviewed:    The following studies were reviewed today: Echocardiogram 10/09/2018 1. The left ventricle has normal systolic function with an ejection fraction of 60-65%. The cavity size was normal. There is severely increased left ventricular wall thickness. Left ventricular diastology could not be  evaluated secondary to atrial  fibrillation. 2. Left atrial size was mildly dilated. 3. The mitral valve is degenerative. Mild thickening of the mitral valve leaflet. Mild calcification of the mitral valve leaflet. There is severe mitral annular calcification present. 4. The tricuspid valve is normal in structure. 5. The aortic valve is tricuspid Severely thickening of the aortic valve Severe calcifcation of the aortic valve. 6. The pulmonic valve was normal in structure. 7. The inferior vena cava was dilated in size with <50% respiratory variability.  EKG:  EKG is personally reviewed.  The ekg ordered 10/25/18 demonstrates possible SR with significant artifact, but prior ECG with what looks like possible atypical flutter, this cannot be excluded.  Recent Labs: 10/08/2018: B Natriuretic Peptide 281.5; Magnesium 2.0; TSH 7.020 01/05/2019: ALT 36; BUN 18; Creatinine, Ser 0.89; Hemoglobin 11.9; Platelets 212; Potassium 4.5; Sodium 139  Recent Lipid Panel    Component Value Date/Time   CHOL 105 01/05/2019 0951   TRIG 42 01/05/2019 0951   HDL 63 01/05/2019 0951   CHOLHDL 1.7 01/05/2019 0951   LDLCALC 34 01/05/2019 0951    Physical Exam:    VS:  BP 125/70   Pulse 66   Ht 6' (1.829 m)   Wt 144 lb 3.2 oz (65.4 kg)   SpO2 100%   BMI 19.56 kg/m     Wt Readings from Last 3 Encounters:  07/27/19 144 lb 3.2 oz (65.4 kg)  01/10/19 135 lb 7.2 oz (61.4 kg)  11/22/18 135 lb 3.2 oz (61.3 kg)    GEN: Well nourished, well developed in no acute distress HEENT: Normal, moist mucous membranes NECK: No JVD CARDIAC: regular rhythm, normal S1 and S2, no rubs or gallops. 1/6 SEM. VASCULAR: Radial and DP pulses 2+ bilaterally. No carotid bruits RESPIRATORY:  Clear to auscultation without rales, wheezing or rhonchi  ABDOMEN: Soft, non-tender, non-distended MUSCULOSKELETAL:  Ambulates independently SKIN: Warm and dry, no edema NEUROLOGIC:  Alert and oriented x 3. No focal neuro deficits  noted. PSYCHIATRIC:  Normal affect    ASSESSMENT:    1. Paroxysmal atrial fibrillation (HCC)   2. Fluctuating blood pressure   3. Hypercholesterolemia   4. Aortic heart murmur   5. Cardiac risk counseling    PLAN:    Atrial fibrillation, reported as both paroxysmal and permanent. Regular rate today. Prior ECG with artifact, one prior appears to be atrial fib vs atypical flutter -on apixaban 2.5 mg BID. He is over 21 years old, but his last Cr is 0.89. Weight today is 65 kg, but before has been <60 kg per report. We discussed that the appropriate dose is 5 mg BID based on data today, but he does not want to change from his current dosing. Discussed this may not offer adequate stroke protection. Continue to follow.  Prior hypotension: improved now that he is off antihypertensives. Remains on alfuzosin, monitor for symptoms  Hyperlipidemia: on atorvastatin for prevention. Is tolerating. Given there is some data for rebound events with withdrawal of statin, will continue for now  Murmur: aortic. Reportedly severely thickened with decreased excursion. Reported as no stenosis on echo. Difficult images. Murmur not  consistent with severe AS today, but monitor  Cardiac risk counseling and prevention recommendations: -recommend heart healthy/Mediterranean diet, with whole grains, fruits, vegetable, fish, lean meats, nuts, and olive oil. Limit salt. -recommend moderate walking, 3-5 times/week for 30-50 minutes each session. Aim for at least 150 minutes.week. Goal should be pace of 3 miles/hours, or walking 1.5 miles in 30 minutes -recommend avoidance of tobacco products. Avoid excess alcohol.  Plan for follow up:6 mos or sooner PRN  Medication Adjustments/Labs and Tests Ordered: Current medicines are reviewed at length with the patient today.  Concerns regarding medicines are outlined above.  No orders of the defined types were placed in this encounter.  No orders of the defined types were  placed in this encounter.   Patient Instructions  Medication Instructions:  Your Physician recommend you continue on your current medication as directed.    *If you need a refill on your cardiac medications before your next appointment, please call your pharmacy*  Lab Work: None  Testing/Procedures: None  Follow-Up: At [facility], you and your health needs are our priority.  As part of our continuing mission to provide you with exceptional heart care, we have created designated Provider Care Teams.  These Care Teams include your primary Cardiologist (physician) and Advanced Practice Providers (APPs -  Physician Assistants and Nurse Practitioners) who all work together to provide you with the care you need, when you need it.  Your next appointment:   6 month(s)  The format for your next appointment:   In Person  Provider:   Buford Dresser, MD    Signed, Buford Dresser, MD PhD 07/27/2019     [place]

## 2019-08-10 ENCOUNTER — Encounter: Payer: Self-pay | Admitting: Emergency Medicine

## 2019-08-10 ENCOUNTER — Ambulatory Visit
Admission: EM | Admit: 2019-08-10 | Discharge: 2019-08-10 | Disposition: A | Discharge status: Home / Self Care | Payer: Medicare Other | Attending: Emergency Medicine | Admitting: Emergency Medicine

## 2019-08-10 ENCOUNTER — Other Ambulatory Visit: Payer: Self-pay

## 2019-08-10 DIAGNOSIS — W19XXXA Unspecified fall, initial encounter: Secondary | ICD-10-CM | POA: Diagnosis not present

## 2019-08-10 DIAGNOSIS — S41112A Laceration without foreign body of left upper arm, initial encounter: Secondary | ICD-10-CM

## 2019-08-10 MED ORDER — TETANUS-DIPHTH-ACELL PERTUSSIS 5-2.5-18.5 LF-MCG/0.5 IM SUSP
0.5000 mL | Freq: Once | INTRAMUSCULAR | Status: AC
  Administered 2019-08-10: 0.5 mL via INTRAMUSCULAR

## 2019-08-10 MED ORDER — TRAMADOL HCL 50 MG PO TABS: 50.0000 mg | ORAL_TABLET | Freq: Four times a day (QID) | ORAL | 0 refills | Status: DC | PRN

## 2019-08-10 NOTE — ED Notes (Signed)
Patient able to ambulate independently  

## 2019-08-10 NOTE — ED Triage Notes (Addendum)
Pt presents to [facility] for assessment after tripping and falling between the couch and the TV stand trying to stand to go to the bathroom on Sunday.  Patient states he went to take the bandage off yesterday and the area bled.  C/o pretty persistent pain to area.  Avulsion noted to 1-2 inched below left AC, actively bleeding.

## 2019-08-10 NOTE — ED Provider Notes (Signed)
[facility]    CSN: [id number] Arrival date & time: 08/10/19  1001      History   Chief Complaint Chief Complaint  Patient presents with  . Arm Pain    HPI HUU DOLL is a 86 y.o. male with history of hypothyroidism, hypertension, A. fib (on Eliquis) presenting for evaluation of skin tear.  States that he fell on Sunday (no head trauma, LOC, other areas of pain) which caused skin tear on left forearm.  Patient endorsing pain.  Has been changing dressings at home, keeping area dry.  States he still has some bloody oozing.  Has tried ice packs, APAP, ibuprofen without significant relief of pain.   Past Medical History:  Diagnosis Date  . Blindness of right eye   . BPH (benign prostatic hyperplasia)   . Essential hypertension   . HLD (hyperlipidemia)   . Hypothyroidism     Patient Active Problem List   Diagnosis Date Noted  . Atrial fibrillation ([place]) 10/08/2018  . Closed fracture of left hip ([place]) 09/04/2016  . Constipation 09/04/2016  . Fall 09/04/2016  . BPH (benign prostatic hyperplasia) 09/04/2016  . Essential hypertension   . HLD (hyperlipidemia)   . Hypothyroidism     Past Surgical History:  Procedure Laterality Date  . bilateral hip replacement    . ESOPHAGEAL MANOMETRY N/A 11/21/2017   Procedure: ESOPHAGEAL MANOMETRY (EM);  Surgeon: Carol Ada, MD;  Location: [facility];  Service: Endoscopy;  Laterality: N/A;  . HERNIA REPAIR    . JOINT REPLACEMENT    . right knee replacement         Home Medications    Prior to Admission medications   Medication Sig Start Date End Date Taking? Authorizing Provider  alfuzosin (UROXATRAL) 10 MG 24 hr tablet Take 10 mg by mouth daily. 09/25/18   [provider]  atorvastatin (LIPITOR) 20 MG tablet Take 20 mg by mouth daily.    [provider]  calcium carbonate (OSCAL) 1500 (600 Ca) MG TABS tablet Take 600 mg of elemental calcium by mouth daily with breakfast.    [provider]  cholecalciferol (VITAMIN D) 1000 units tablet Take 1,000 Units by mouth daily.    [provider]  docusate sodium (COLACE) 100 MG capsule Take 100 mg by mouth daily.    [provider]  ELIQUIS 2.5 MG TABS tablet 5 mg 2 (two) times daily. 04/02/19   [provider]  levothyroxine (SYNTHROID) 100 MCG tablet Take 120 mcg by mouth daily before breakfast. Increase to 120 mcg    [provider]  Multiple Vitamins-Minerals (MULTIVITAMIN WITH MINERALS) tablet Take 1 tablet by mouth daily.    [provider]  polyethylene glycol powder (GLYCOLAX/MIRALAX) 17 GM/SCOOP powder Take by mouth as needed. 09/09/16   [provider]  traMADol (ULTRAM) 50 MG tablet Take 1 tablet (50 mg total) by mouth every 6 (six) hours as needed for severe pain. 08/10/19   Hall-Potvin, [place], PA-C    Family History Family History  Problem Relation Age of Onset  . Heart disease Father   . Heart attack Father        in his 59's to 65's  . Heart attack Mother        in her 75's    Social History Social History   Tobacco Use  . Smoking status: Former Research scientist (life sciences)  . Smokeless tobacco: Never Used  Substance Use Topics  . Alcohol use: No  . Drug use: No  Allergies   Bee venom, Milk-related compounds, Coricidin d cold-flu-sinus [chlorphen-pe-acetaminophen], and Maxzide [hydrochlorothiazide w-triamterene]   Review of Systems Review of Systems  Constitutional: Negative for fatigue and fever.  Respiratory: Negative for cough and shortness of breath.   Cardiovascular: Negative for chest pain and palpitations.  Gastrointestinal: Negative for abdominal pain, diarrhea and vomiting.  Musculoskeletal: Negative for arthralgias and myalgias.  Skin: Positive for wound. Negative for rash.  Neurological: Negative for speech difficulty and headaches.  All other systems reviewed and are negative.    Physical Exam Triage Vital Signs ED Triage Vitals  [08/10/19 1011]  Enc Vitals Group     BP 116/62     Pulse Rate 87     Resp 16     Temp 98.2 F (36.8 C)     Temp Source Temporal     SpO2 96 %     Weight      Height      Head Circumference      Peak Flow      Pain Score 7     Pain Loc      Pain Edu?      Excl. in [place]?    No data found.  Updated Vital Signs BP 116/62 (BP Location: Left Arm)   Pulse 87   Temp 98.2 F (36.8 C) (Temporal)   Resp 16   SpO2 96%   Visual Acuity Right Eye Distance:   Left Eye Distance:   Bilateral Distance:    Right Eye Near:   Left Eye Near:    Bilateral Near:     Physical Exam Constitutional:      General: He is not in acute distress.    Appearance: He is normal weight. He is not ill-appearing.  HENT:     Head: Normocephalic and atraumatic.     Mouth/Throat:     Mouth: Mucous membranes are moist.     Pharynx: Oropharynx is clear.  Eyes:     General: No scleral icterus.    Pupils: Pupils are equal, round, and reactive to light.  Cardiovascular:     Rate and Rhythm: Normal rate.  Pulmonary:     Effort: Pulmonary effort is normal. No respiratory distress.     Breath sounds: No wheezing.  Musculoskeletal:        General: Tenderness present. No swelling. Normal range of motion.  Skin:    Capillary Refill: Capillary refill takes less than 2 seconds.     Coloration: Skin is not jaundiced or pale.     Findings: No rash.     Comments: 2.5 cm superficial skin tear.  No active bleeding, trace oozing  Neurological:     Mental Status: He is alert and oriented to person, place, and time.      UC Treatments / Results  Labs (all labs ordered are listed, but only abnormal results are displayed) Labs Reviewed - No data to display  EKG   Radiology No results found.  Procedures Laceration Repair  Date/Time: 08/10/2019 11:31 AM Performed by: Quincy Sheehan, PA-C Authorized by: Quincy Sheehan, PA-C   Consent:    Consent obtained:  Verbal   Consent given by:   Patient   Risks discussed:  Infection, need for additional repair, pain, poor cosmetic result and poor wound healing   Alternatives discussed:  No treatment and delayed treatment Universal protocol:    Patient identity confirmed:  Verbally with patient Anesthesia (see MAR for exact dosages):    Anesthesia method:  None Laceration  details:    Location:  Shoulder/arm   Shoulder/arm location:  L lower arm   Length (cm):  3   Depth (mm):  2 Repair type:    Repair type:  Simple Pre-procedure details:    Preparation:  Patient was prepped and draped in usual sterile fashion Exploration:    Hemostasis achieved with:  Direct pressure   Wound exploration: wound explored through full range of motion     Wound extent: no areolar tissue violation noted, no fascia violation noted, no foreign bodies/material noted, no nerve damage noted and no tendon damage noted     Contaminated: no   Treatment:    Area cleansed with:  Hibiclens   Amount of cleaning:  Standard Skin repair:    Repair method:  Steri-Strips   Number of Steri-Strips:  3 Approximation:    Approximation:  Close Post-procedure details:    Dressing:  Non-adherent dressing and bulky dressing   Patient tolerance of procedure:  Tolerated well, no immediate complications   (including critical care time)  Medications Ordered in UC Medications  Tdap (BOOSTRIX) injection 0.5 mL (0.5 mLs Intramuscular Given 08/10/19 1033)    Initial Impression / Assessment and Plan / UC Course  I have reviewed the triage vital signs and the nursing notes.  Pertinent labs & imaging results that were available during my care of the patient were reviewed by me and considered in my medical decision making (see chart for details).     Patient afebrile, nontoxic.  Normotensive without tachycardia.  No active bleeding identified, though patient does have significant pain with palpation.  Patient has tolerated tramadol well in the past, failed conservative  management at home: Refill sent (last filled February 2019).  Three Steri-Strips placed with overlying pressure dressing  whichpatient tolerated well.  Patient has family member at home who can help him with dressing changes.  Patient states he has appointment next week with PCP: Intends on keeping this.  Return precautions discussed, patient verbalized understanding and is agreeable to plan. Final Clinical Impressions(s) / UC Diagnoses   Final diagnoses:  Skin tear of left upper arm without complication, initial encounter     Discharge Instructions     Keep area(s) clean and dry. Important to follow up with PCP in 1 week for reevaluation. Return for worsening pain, redness, swelling, discharge, fever.  Helpful prevention tips: Keep nails short to avoid secondary skin infections.    ED Prescriptions    Medication Sig Dispense Auth. Provider   traMADol (ULTRAM) 50 MG tablet Take 1 tablet (50 mg total) by mouth every 6 (six) hours as needed for severe pain. 12 tablet Hall-Potvin, [place], PA-C     I have reviewed the PDMP during this encounter.   Hall-Potvin, [place], [place] 08/10/19 1133

## 2019-08-10 NOTE — Discharge Instructions (Addendum)
Keep area(s) clean and dry. Important to follow up with PCP in 1 week for reevaluation. Return for worsening pain, redness, swelling, discharge, fever.  Helpful prevention tips: Keep nails short to avoid secondary skin infections.

## 2019-08-26 ENCOUNTER — Encounter: Payer: Self-pay | Admitting: Cardiology

## 2019-08-26 DIAGNOSIS — I358 Other nonrheumatic aortic valve disorders: Secondary | ICD-10-CM | POA: Insufficient documentation

## 2019-08-27 DIAGNOSIS — E559 Vitamin D deficiency, unspecified: Secondary | ICD-10-CM | POA: Diagnosis not present

## 2019-08-27 DIAGNOSIS — E119 Type 2 diabetes mellitus without complications: Secondary | ICD-10-CM | POA: Diagnosis not present

## 2019-08-27 DIAGNOSIS — Z125 Encounter for screening for malignant neoplasm of prostate: Secondary | ICD-10-CM | POA: Diagnosis not present

## 2019-08-27 DIAGNOSIS — I10 Essential (primary) hypertension: Secondary | ICD-10-CM | POA: Diagnosis not present

## 2019-08-27 DIAGNOSIS — E039 Hypothyroidism, unspecified: Secondary | ICD-10-CM | POA: Diagnosis not present

## 2019-08-30 DIAGNOSIS — R310 Gross hematuria: Secondary | ICD-10-CM | POA: Diagnosis not present

## 2019-08-30 DIAGNOSIS — N3941 Urge incontinence: Secondary | ICD-10-CM | POA: Diagnosis not present

## 2019-08-30 DIAGNOSIS — N401 Enlarged prostate with lower urinary tract symptoms: Secondary | ICD-10-CM | POA: Diagnosis not present

## 2019-09-03 DIAGNOSIS — Z0000 Encounter for general adult medical examination without abnormal findings: Secondary | ICD-10-CM | POA: Diagnosis not present

## 2019-09-03 DIAGNOSIS — R972 Elevated prostate specific antigen [PSA]: Secondary | ICD-10-CM | POA: Diagnosis not present

## 2019-09-03 DIAGNOSIS — I4891 Unspecified atrial fibrillation: Secondary | ICD-10-CM | POA: Diagnosis not present

## 2019-09-03 DIAGNOSIS — I10 Essential (primary) hypertension: Secondary | ICD-10-CM | POA: Diagnosis not present

## 2019-09-03 DIAGNOSIS — E119 Type 2 diabetes mellitus without complications: Secondary | ICD-10-CM | POA: Diagnosis not present

## 2019-09-03 DIAGNOSIS — Z8781 Personal history of (healed) traumatic fracture: Secondary | ICD-10-CM | POA: Diagnosis not present

## 2019-09-03 DIAGNOSIS — E039 Hypothyroidism, unspecified: Secondary | ICD-10-CM | POA: Diagnosis not present

## 2019-09-03 DIAGNOSIS — R2689 Other abnormalities of gait and mobility: Secondary | ICD-10-CM | POA: Diagnosis not present

## 2019-09-04 DIAGNOSIS — E559 Vitamin D deficiency, unspecified: Secondary | ICD-10-CM | POA: Diagnosis not present

## 2019-09-04 DIAGNOSIS — M150 Primary generalized (osteo)arthritis: Secondary | ICD-10-CM | POA: Diagnosis not present

## 2019-09-04 DIAGNOSIS — E1142 Type 2 diabetes mellitus with diabetic polyneuropathy: Secondary | ICD-10-CM | POA: Diagnosis not present

## 2019-09-04 DIAGNOSIS — I10 Essential (primary) hypertension: Secondary | ICD-10-CM | POA: Diagnosis not present

## 2019-09-04 DIAGNOSIS — I4891 Unspecified atrial fibrillation: Secondary | ICD-10-CM | POA: Diagnosis not present

## 2019-09-04 DIAGNOSIS — Z7901 Long term (current) use of anticoagulants: Secondary | ICD-10-CM | POA: Diagnosis not present

## 2019-09-04 DIAGNOSIS — Z96643 Presence of artificial hip joint, bilateral: Secondary | ICD-10-CM | POA: Diagnosis not present

## 2019-09-04 DIAGNOSIS — Z8781 Personal history of (healed) traumatic fracture: Secondary | ICD-10-CM | POA: Diagnosis not present

## 2019-09-04 DIAGNOSIS — E785 Hyperlipidemia, unspecified: Secondary | ICD-10-CM | POA: Diagnosis not present

## 2019-09-04 DIAGNOSIS — Z9181 History of falling: Secondary | ICD-10-CM | POA: Diagnosis not present

## 2019-09-04 DIAGNOSIS — G4700 Insomnia, unspecified: Secondary | ICD-10-CM | POA: Diagnosis not present

## 2019-09-04 DIAGNOSIS — R2681 Unsteadiness on feet: Secondary | ICD-10-CM | POA: Diagnosis not present

## 2019-09-04 DIAGNOSIS — Z86718 Personal history of other venous thrombosis and embolism: Secondary | ICD-10-CM | POA: Diagnosis not present

## 2019-09-04 DIAGNOSIS — E039 Hypothyroidism, unspecified: Secondary | ICD-10-CM | POA: Diagnosis not present

## 2019-09-04 DIAGNOSIS — Z96651 Presence of right artificial knee joint: Secondary | ICD-10-CM | POA: Diagnosis not present

## 2019-09-07 DIAGNOSIS — E559 Vitamin D deficiency, unspecified: Secondary | ICD-10-CM | POA: Diagnosis not present

## 2019-09-07 DIAGNOSIS — E1142 Type 2 diabetes mellitus with diabetic polyneuropathy: Secondary | ICD-10-CM | POA: Diagnosis not present

## 2019-09-07 DIAGNOSIS — M150 Primary generalized (osteo)arthritis: Secondary | ICD-10-CM | POA: Diagnosis not present

## 2019-09-07 DIAGNOSIS — I10 Essential (primary) hypertension: Secondary | ICD-10-CM | POA: Diagnosis not present

## 2019-09-07 DIAGNOSIS — I4891 Unspecified atrial fibrillation: Secondary | ICD-10-CM | POA: Diagnosis not present

## 2019-09-07 DIAGNOSIS — R2681 Unsteadiness on feet: Secondary | ICD-10-CM | POA: Diagnosis not present

## 2019-09-11 DIAGNOSIS — M150 Primary generalized (osteo)arthritis: Secondary | ICD-10-CM | POA: Diagnosis not present

## 2019-09-11 DIAGNOSIS — I10 Essential (primary) hypertension: Secondary | ICD-10-CM | POA: Diagnosis not present

## 2019-09-11 DIAGNOSIS — R2681 Unsteadiness on feet: Secondary | ICD-10-CM | POA: Diagnosis not present

## 2019-09-11 DIAGNOSIS — E559 Vitamin D deficiency, unspecified: Secondary | ICD-10-CM | POA: Diagnosis not present

## 2019-09-11 DIAGNOSIS — E1142 Type 2 diabetes mellitus with diabetic polyneuropathy: Secondary | ICD-10-CM | POA: Diagnosis not present

## 2019-09-11 DIAGNOSIS — I4891 Unspecified atrial fibrillation: Secondary | ICD-10-CM | POA: Diagnosis not present

## 2019-09-13 DIAGNOSIS — I10 Essential (primary) hypertension: Secondary | ICD-10-CM | POA: Diagnosis not present

## 2019-09-13 DIAGNOSIS — R2681 Unsteadiness on feet: Secondary | ICD-10-CM | POA: Diagnosis not present

## 2019-09-13 DIAGNOSIS — M150 Primary generalized (osteo)arthritis: Secondary | ICD-10-CM | POA: Diagnosis not present

## 2019-09-13 DIAGNOSIS — I4891 Unspecified atrial fibrillation: Secondary | ICD-10-CM | POA: Diagnosis not present

## 2019-09-13 DIAGNOSIS — E559 Vitamin D deficiency, unspecified: Secondary | ICD-10-CM | POA: Diagnosis not present

## 2019-09-13 DIAGNOSIS — E1142 Type 2 diabetes mellitus with diabetic polyneuropathy: Secondary | ICD-10-CM | POA: Diagnosis not present

## 2019-09-18 DIAGNOSIS — E559 Vitamin D deficiency, unspecified: Secondary | ICD-10-CM | POA: Diagnosis not present

## 2019-09-18 DIAGNOSIS — E1142 Type 2 diabetes mellitus with diabetic polyneuropathy: Secondary | ICD-10-CM | POA: Diagnosis not present

## 2019-09-18 DIAGNOSIS — I4891 Unspecified atrial fibrillation: Secondary | ICD-10-CM | POA: Diagnosis not present

## 2019-09-18 DIAGNOSIS — M150 Primary generalized (osteo)arthritis: Secondary | ICD-10-CM | POA: Diagnosis not present

## 2019-09-18 DIAGNOSIS — I10 Essential (primary) hypertension: Secondary | ICD-10-CM | POA: Diagnosis not present

## 2019-09-18 DIAGNOSIS — R2681 Unsteadiness on feet: Secondary | ICD-10-CM | POA: Diagnosis not present

## 2019-09-20 DIAGNOSIS — I4891 Unspecified atrial fibrillation: Secondary | ICD-10-CM | POA: Diagnosis not present

## 2019-09-20 DIAGNOSIS — E1142 Type 2 diabetes mellitus with diabetic polyneuropathy: Secondary | ICD-10-CM | POA: Diagnosis not present

## 2019-09-20 DIAGNOSIS — I10 Essential (primary) hypertension: Secondary | ICD-10-CM | POA: Diagnosis not present

## 2019-09-20 DIAGNOSIS — R2681 Unsteadiness on feet: Secondary | ICD-10-CM | POA: Diagnosis not present

## 2019-09-21 DIAGNOSIS — M150 Primary generalized (osteo)arthritis: Secondary | ICD-10-CM | POA: Diagnosis not present

## 2019-09-21 DIAGNOSIS — I10 Essential (primary) hypertension: Secondary | ICD-10-CM | POA: Diagnosis not present

## 2019-09-21 DIAGNOSIS — R2681 Unsteadiness on feet: Secondary | ICD-10-CM | POA: Diagnosis not present

## 2019-09-21 DIAGNOSIS — I4891 Unspecified atrial fibrillation: Secondary | ICD-10-CM | POA: Diagnosis not present

## 2019-09-21 DIAGNOSIS — E1142 Type 2 diabetes mellitus with diabetic polyneuropathy: Secondary | ICD-10-CM | POA: Diagnosis not present

## 2019-09-21 DIAGNOSIS — E559 Vitamin D deficiency, unspecified: Secondary | ICD-10-CM | POA: Diagnosis not present

## 2019-09-25 DIAGNOSIS — I4891 Unspecified atrial fibrillation: Secondary | ICD-10-CM | POA: Diagnosis not present

## 2019-09-25 DIAGNOSIS — E559 Vitamin D deficiency, unspecified: Secondary | ICD-10-CM | POA: Diagnosis not present

## 2019-09-25 DIAGNOSIS — R2681 Unsteadiness on feet: Secondary | ICD-10-CM | POA: Diagnosis not present

## 2019-09-25 DIAGNOSIS — I10 Essential (primary) hypertension: Secondary | ICD-10-CM | POA: Diagnosis not present

## 2019-09-25 DIAGNOSIS — M150 Primary generalized (osteo)arthritis: Secondary | ICD-10-CM | POA: Diagnosis not present

## 2019-09-25 DIAGNOSIS — E1142 Type 2 diabetes mellitus with diabetic polyneuropathy: Secondary | ICD-10-CM | POA: Diagnosis not present

## 2019-10-02 DIAGNOSIS — E559 Vitamin D deficiency, unspecified: Secondary | ICD-10-CM | POA: Diagnosis not present

## 2019-10-02 DIAGNOSIS — I4891 Unspecified atrial fibrillation: Secondary | ICD-10-CM | POA: Diagnosis not present

## 2019-10-02 DIAGNOSIS — I10 Essential (primary) hypertension: Secondary | ICD-10-CM | POA: Diagnosis not present

## 2019-10-02 DIAGNOSIS — R2681 Unsteadiness on feet: Secondary | ICD-10-CM | POA: Diagnosis not present

## 2019-10-02 DIAGNOSIS — M150 Primary generalized (osteo)arthritis: Secondary | ICD-10-CM | POA: Diagnosis not present

## 2019-10-02 DIAGNOSIS — E1142 Type 2 diabetes mellitus with diabetic polyneuropathy: Secondary | ICD-10-CM | POA: Diagnosis not present

## 2019-10-04 DIAGNOSIS — Z86718 Personal history of other venous thrombosis and embolism: Secondary | ICD-10-CM | POA: Diagnosis not present

## 2019-10-04 DIAGNOSIS — Z8781 Personal history of (healed) traumatic fracture: Secondary | ICD-10-CM | POA: Diagnosis not present

## 2019-10-04 DIAGNOSIS — Z7901 Long term (current) use of anticoagulants: Secondary | ICD-10-CM | POA: Diagnosis not present

## 2019-10-04 DIAGNOSIS — M150 Primary generalized (osteo)arthritis: Secondary | ICD-10-CM | POA: Diagnosis not present

## 2019-10-04 DIAGNOSIS — Z96643 Presence of artificial hip joint, bilateral: Secondary | ICD-10-CM | POA: Diagnosis not present

## 2019-10-04 DIAGNOSIS — G4700 Insomnia, unspecified: Secondary | ICD-10-CM | POA: Diagnosis not present

## 2019-10-04 DIAGNOSIS — Z96651 Presence of right artificial knee joint: Secondary | ICD-10-CM | POA: Diagnosis not present

## 2019-10-04 DIAGNOSIS — Z9181 History of falling: Secondary | ICD-10-CM | POA: Diagnosis not present

## 2019-10-04 DIAGNOSIS — E1142 Type 2 diabetes mellitus with diabetic polyneuropathy: Secondary | ICD-10-CM | POA: Diagnosis not present

## 2019-10-04 DIAGNOSIS — I4891 Unspecified atrial fibrillation: Secondary | ICD-10-CM | POA: Diagnosis not present

## 2019-10-04 DIAGNOSIS — I10 Essential (primary) hypertension: Secondary | ICD-10-CM | POA: Diagnosis not present

## 2019-10-04 DIAGNOSIS — E559 Vitamin D deficiency, unspecified: Secondary | ICD-10-CM | POA: Diagnosis not present

## 2019-10-04 DIAGNOSIS — E039 Hypothyroidism, unspecified: Secondary | ICD-10-CM | POA: Diagnosis not present

## 2019-10-04 DIAGNOSIS — R2681 Unsteadiness on feet: Secondary | ICD-10-CM | POA: Diagnosis not present

## 2019-10-04 DIAGNOSIS — E785 Hyperlipidemia, unspecified: Secondary | ICD-10-CM | POA: Diagnosis not present

## 2019-10-10 DIAGNOSIS — I10 Essential (primary) hypertension: Secondary | ICD-10-CM | POA: Diagnosis not present

## 2019-10-10 DIAGNOSIS — I4891 Unspecified atrial fibrillation: Secondary | ICD-10-CM | POA: Diagnosis not present

## 2019-10-10 DIAGNOSIS — R2681 Unsteadiness on feet: Secondary | ICD-10-CM | POA: Diagnosis not present

## 2019-10-10 DIAGNOSIS — E559 Vitamin D deficiency, unspecified: Secondary | ICD-10-CM | POA: Diagnosis not present

## 2019-10-10 DIAGNOSIS — E1142 Type 2 diabetes mellitus with diabetic polyneuropathy: Secondary | ICD-10-CM | POA: Diagnosis not present

## 2019-10-10 DIAGNOSIS — M150 Primary generalized (osteo)arthritis: Secondary | ICD-10-CM | POA: Diagnosis not present

## 2019-10-17 DIAGNOSIS — E1142 Type 2 diabetes mellitus with diabetic polyneuropathy: Secondary | ICD-10-CM | POA: Diagnosis not present

## 2019-10-17 DIAGNOSIS — R2681 Unsteadiness on feet: Secondary | ICD-10-CM | POA: Diagnosis not present

## 2019-10-17 DIAGNOSIS — I4891 Unspecified atrial fibrillation: Secondary | ICD-10-CM | POA: Diagnosis not present

## 2019-10-17 DIAGNOSIS — E559 Vitamin D deficiency, unspecified: Secondary | ICD-10-CM | POA: Diagnosis not present

## 2019-10-17 DIAGNOSIS — I10 Essential (primary) hypertension: Secondary | ICD-10-CM | POA: Diagnosis not present

## 2019-10-17 DIAGNOSIS — M150 Primary generalized (osteo)arthritis: Secondary | ICD-10-CM | POA: Diagnosis not present

## 2019-10-24 DIAGNOSIS — E559 Vitamin D deficiency, unspecified: Secondary | ICD-10-CM | POA: Diagnosis not present

## 2019-10-24 DIAGNOSIS — M150 Primary generalized (osteo)arthritis: Secondary | ICD-10-CM | POA: Diagnosis not present

## 2019-10-24 DIAGNOSIS — I10 Essential (primary) hypertension: Secondary | ICD-10-CM | POA: Diagnosis not present

## 2019-10-24 DIAGNOSIS — R2681 Unsteadiness on feet: Secondary | ICD-10-CM | POA: Diagnosis not present

## 2019-10-24 DIAGNOSIS — I4891 Unspecified atrial fibrillation: Secondary | ICD-10-CM | POA: Diagnosis not present

## 2019-10-24 DIAGNOSIS — E1142 Type 2 diabetes mellitus with diabetic polyneuropathy: Secondary | ICD-10-CM | POA: Diagnosis not present

## 2019-11-13 ENCOUNTER — Encounter: Payer: Self-pay | Admitting: Emergency Medicine

## 2019-11-13 ENCOUNTER — Ambulatory Visit
Admission: EM | Admit: 2019-11-13 | Discharge: 2019-11-13 | Disposition: A | Discharge status: Home / Self Care | Payer: Medicare Other

## 2019-11-13 ENCOUNTER — Other Ambulatory Visit: Payer: Self-pay

## 2019-11-13 DIAGNOSIS — Z711 Person with feared health complaint in whom no diagnosis is made: Secondary | ICD-10-CM

## 2019-11-13 DIAGNOSIS — M25552 Pain in left hip: Secondary | ICD-10-CM

## 2019-11-13 NOTE — ED Provider Notes (Signed)
[facility]    CSN: [id number] Arrival date & time: 11/13/19  1711      History   Chief Complaint Chief Complaint  Patient presents with  . Groin Pain    HPI Gene Hunter is a 86 y.o. male with history of BPH, hypothyroidism, hypertension presenting with daughter for evaluation of left hip/groin pain.  States this began around 3 PM.  States he received second dose of Covid vaccine around 10 AM.  Patient states pain is nonradiating, no known exacerbating factors.  No left leg numbness, weakness, difficulty walking.  Denies testicular pain or swelling.  Daughter gave patient Aleve before coming into office: Patient states hip pain is feeling better at time of assessment.  No fall, trauma, fever.   Past Medical History:  Diagnosis Date  . Blindness of right eye   . BPH (benign prostatic hyperplasia)   . Essential hypertension   . HLD (hyperlipidemia)   . Hypothyroidism     Patient Active Problem List   Diagnosis Date Noted  . Aortic heart murmur 08/26/2019  . Atrial fibrillation ([place]) 10/08/2018  . Closed fracture of left hip ([place]) 09/04/2016  . Constipation 09/04/2016  . Fall 09/04/2016  . BPH (benign prostatic hyperplasia) 09/04/2016  . Essential hypertension   . HLD (hyperlipidemia)   . Hypothyroidism     Past Surgical History:  Procedure Laterality Date  . bilateral hip replacement    . ESOPHAGEAL MANOMETRY N/A 11/21/2017   Procedure: ESOPHAGEAL MANOMETRY (EM);  Surgeon: Carol Ada, MD;  Location: [facility];  Service: Endoscopy;  Laterality: N/A;  . HERNIA REPAIR    . JOINT REPLACEMENT    . right knee replacement         Home Medications    Prior to Admission medications   Medication Sig Start Date End Date Taking? Authorizing Provider  alfuzosin (UROXATRAL) 10 MG 24 hr tablet Take 10 mg by mouth daily. 09/25/18   [provider]  atorvastatin (LIPITOR) 20 MG tablet Take 20 mg by mouth daily.    [provider]  calcium  carbonate (OSCAL) 1500 (600 Ca) MG TABS tablet Take 600 mg of elemental calcium by mouth daily with breakfast.    [provider]  cholecalciferol (VITAMIN D) 1000 units tablet Take 1,000 Units by mouth daily.    [provider]  docusate sodium (COLACE) 100 MG capsule Take 100 mg by mouth daily.    [provider]  ELIQUIS 2.5 MG TABS tablet 5 mg 2 (two) times daily. 04/02/19   [provider]  levothyroxine (SYNTHROID) 100 MCG tablet Take 120 mcg by mouth daily before breakfast. Increase to 120 mcg    [provider]  Multiple Vitamins-Minerals (MULTIVITAMIN WITH MINERALS) tablet Take 1 tablet by mouth daily.    [provider]  polyethylene glycol powder (GLYCOLAX/MIRALAX) 17 GM/SCOOP powder Take by mouth as needed. 09/09/16   [provider]  traMADol (ULTRAM) 50 MG tablet Take 1 tablet (50 mg total) by mouth every 6 (six) hours as needed for severe pain. Patient not taking: Reported on 11/13/2019 08/10/19   Hall-Potvin, [place], PA-C    Family History Family History  Problem Relation Age of Onset  . Heart disease Father   . Heart attack Father        in his 68's to 40's  . Heart attack Mother        in her 45's    Social History Social History   Tobacco Use  .  Smoking status: Former Research scientist (life sciences)  . Smokeless tobacco: Never Used  Substance Use Topics  . Alcohol use: No  . Drug use: No     Allergies   Bee venom, Milk-related compounds, Coricidin d cold-flu-sinus [chlorphen-pe-acetaminophen], and Maxzide [hydrochlorothiazide w-triamterene]   Review of Systems As per HPI   Physical Exam Triage Vital Signs ED Triage Vitals  Enc Vitals Group     BP      Pulse      Resp      Temp      Temp src      SpO2      Weight      Height      Head Circumference      Peak Flow      Pain Score      Pain Loc      Pain Edu?      Excl. in [place]?    No data found.  Updated Vital Signs BP 127/66 (BP Location: Right Arm)    Pulse 77   Temp 97.7 F (36.5 C) (Oral)   Resp 18   SpO2 98%   Visual Acuity Right Eye Distance:   Left Eye Distance:   Bilateral Distance:    Right Eye Near:   Left Eye Near:    Bilateral Near:     Physical Exam Constitutional:      General: He is not in acute distress. HENT:     Head: Normocephalic and atraumatic.  Eyes:     General: No scleral icterus.    Pupils: Pupils are equal, round, and reactive to light.  Cardiovascular:     Rate and Rhythm: Normal rate.  Pulmonary:     Effort: Pulmonary effort is normal. No respiratory distress.     Breath sounds: No wheezing.  Abdominal:     General: Abdomen is flat. Bowel sounds are normal.     Palpations: Abdomen is soft.     Tenderness: There is no abdominal tenderness. There is no right CVA tenderness, left CVA tenderness or guarding.  Musculoskeletal:     Comments: Normal ROM of left hip.  No bony tenderness of hip.  No inguinal tenderness, mass.  Skin:    Coloration: Skin is not jaundiced or pale.  Neurological:     Mental Status: He is alert and oriented to person, place, and time.     Sensory: No sensory deficit.     Gait: Gait normal.      UC Treatments / Results  Labs (all labs ordered are listed, but only abnormal results are displayed) Labs Reviewed - No data to display  EKG   Radiology No results found.  Procedures Procedures (including critical care time)  Medications Ordered in UC Medications - No data to display  Initial Impression / Assessment and Plan / UC Course  I have reviewed the triage vital signs and the nursing notes.  Pertinent labs & imaging results that were available during my care of the patient were reviewed by me and considered in my medical decision making (see chart for details).     Patient afebrile, nontoxic, without fall or injury.  No bony tenderness: Radiography deferred.  Low concern for this being ADR of Covid vaccine.  Likely musculoskeletal, currently relieved  with Aleve.  Provided reassurance to patient and daughter.  Will treat supportively as outlined below, observe, follow-up with PCP for persistent, worsening symptoms.  Return precautions discussed, patient and daughter verbalized understanding and are agreeable to plan. Final Clinical Impressions(s) /  UC Diagnoses   Final diagnoses:  Worried well  Left hip pain     Discharge Instructions     May continue Aleve up to 2 times daily: Important to take with food/milk and drink plenty of water. May apply ice/heat as needed for additional pain relief. Important follow-up with primary care for persistent or worsening symptoms. Return for worsening pain, left leg numbness or weakness, testicular pain or swelling, fever.    ED Prescriptions    None     PDMP not reviewed this encounter.   Hall-Potvin, [place], [place] 11/13/19 1811

## 2019-11-13 NOTE — ED Triage Notes (Signed)
Pt here for left hip/groin pain after receiving his 2nd covid vaccine today; pt with hx of hip replacement

## 2019-11-13 NOTE — Discharge Instructions (Signed)
May continue Aleve up to 2 times daily: Important to take with food/milk and drink plenty of water. May apply ice/heat as needed for additional pain relief. Important follow-up with primary care for persistent or worsening symptoms. Return for worsening pain, left leg numbness or weakness, testicular pain or swelling, fever.

## 2019-12-04 DIAGNOSIS — E039 Hypothyroidism, unspecified: Secondary | ICD-10-CM | POA: Diagnosis not present

## 2019-12-04 DIAGNOSIS — I4891 Unspecified atrial fibrillation: Secondary | ICD-10-CM | POA: Diagnosis not present

## 2019-12-04 DIAGNOSIS — E559 Vitamin D deficiency, unspecified: Secondary | ICD-10-CM | POA: Diagnosis not present

## 2019-12-04 DIAGNOSIS — Z79899 Other long term (current) drug therapy: Secondary | ICD-10-CM | POA: Diagnosis not present

## 2019-12-04 DIAGNOSIS — E7800 Pure hypercholesterolemia, unspecified: Secondary | ICD-10-CM | POA: Diagnosis not present

## 2019-12-04 DIAGNOSIS — R636 Underweight: Secondary | ICD-10-CM | POA: Diagnosis not present

## 2019-12-04 DIAGNOSIS — E119 Type 2 diabetes mellitus without complications: Secondary | ICD-10-CM | POA: Diagnosis not present

## 2019-12-04 DIAGNOSIS — I10 Essential (primary) hypertension: Secondary | ICD-10-CM | POA: Diagnosis not present

## 2020-01-09 ENCOUNTER — Ambulatory Visit
Admission: EM | Admit: 2020-01-09 | Discharge: 2020-01-09 | Disposition: A | Discharge status: Home / Self Care | Payer: Medicare Other

## 2020-01-09 ENCOUNTER — Other Ambulatory Visit: Payer: Self-pay

## 2020-01-09 DIAGNOSIS — W57XXXA Bitten or stung by nonvenomous insect and other nonvenomous arthropods, initial encounter: Secondary | ICD-10-CM | POA: Diagnosis not present

## 2020-01-09 DIAGNOSIS — S30860A Insect bite (nonvenomous) of lower back and pelvis, initial encounter: Secondary | ICD-10-CM

## 2020-01-09 NOTE — ED Triage Notes (Signed)
Per daughter in law pt has ticks to both upper legs and lower legs. States not sure if she got the head out to rt ankle area.

## 2020-01-11 NOTE — ED Provider Notes (Signed)
[facility]    CSN: [id number] Arrival date & time: 01/09/20  1143      History   Chief Complaint Chief Complaint  Patient presents with   Tick Removal    HPI Gene Hunter is a 86 y.o. male.   Pt here to be checked for ticks.  Family member has found several ticks on pt.  No fever no chills no headaches.  Pt feels well   The history is provided by the patient and a caregiver. No language interpreter was used.    Past Medical History:  Diagnosis Date   Blindness of right eye    BPH (benign prostatic hyperplasia)    Essential hypertension    HLD (hyperlipidemia)    Hypothyroidism     Patient Active Problem List   Diagnosis Date Noted   Aortic heart murmur 08/26/2019   Atrial fibrillation ([place]) 10/08/2018   Closed fracture of left hip ([place]) 09/04/2016   Constipation 09/04/2016   Fall 09/04/2016   BPH (benign prostatic hyperplasia) 09/04/2016   Essential hypertension    HLD (hyperlipidemia)    Hypothyroidism     Past Surgical History:  Procedure Laterality Date   bilateral hip replacement     ESOPHAGEAL MANOMETRY N/A 11/21/2017   Procedure: ESOPHAGEAL MANOMETRY (EM);  Surgeon: Carol Ada, MD;  Location: [facility];  Service: Endoscopy;  Laterality: N/A;   HERNIA REPAIR     JOINT REPLACEMENT     right knee replacement         Home Medications    Prior to Admission medications   Medication Sig Start Date End Date Taking? Authorizing Provider  alfuzosin (UROXATRAL) 10 MG 24 hr tablet Take 10 mg by mouth daily. 09/25/18   [provider]  atorvastatin (LIPITOR) 20 MG tablet Take 20 mg by mouth daily.    [provider]  calcium carbonate (OSCAL) 1500 (600 Ca) MG TABS tablet Take 600 mg of elemental calcium by mouth daily with breakfast.    [provider]  cholecalciferol (VITAMIN D) 1000 units tablet Take 1,000 Units by mouth daily.    [provider]  docusate sodium (COLACE) 100 MG  capsule Take 100 mg by mouth daily.    [provider]  ELIQUIS 2.5 MG TABS tablet 5 mg 2 (two) times daily. 04/02/19   [provider]  levothyroxine (SYNTHROID) 100 MCG tablet Take 120 mcg by mouth daily before breakfast. Increase to 120 mcg    [provider]  Multiple Vitamins-Minerals (MULTIVITAMIN WITH MINERALS) tablet Take 1 tablet by mouth daily.    [provider]  polyethylene glycol powder (GLYCOLAX/MIRALAX) 17 GM/SCOOP powder Take by mouth as needed. 09/09/16   [provider]    Family History Family History  Problem Relation Age of Onset   Heart disease Father    Heart attack Father        in his 81's to 60's   Heart attack Mother        in her 18's    Social History Social History   Tobacco Use   Smoking status: Former Smoker   Smokeless tobacco: Never Used  Substance Use Topics   Alcohol use: No   Drug use: No     Allergies   Bee venom, Milk-related compounds, Coricidin d cold-flu-sinus [chlorphen-pe-acetaminophen], and Maxzide [hydrochlorothiazide w-triamterene]   Review of Systems Review of Systems  All other systems reviewed and are negative.    Physical Exam Triage Vital Signs ED Triage Vitals  Enc Vitals Group     BP 01/09/20 1202 136/80     Pulse Rate 01/09/20 1202 78     Resp 01/09/20 1202 18     Temp 01/09/20 1202 (!) 97.3 F (36.3 C)     Temp Source 01/09/20 1202 Oral     SpO2 01/09/20 1202 98 %     Weight --      Height --      Head Circumference --      Peak Flow --      Pain Score 01/09/20 1203 0     Pain Loc --      Pain Edu? --      Excl. in [place]? --    No data found.  Updated Vital Signs BP 136/80 (BP Location: Left Arm)    Pulse 78    Temp (!) 97.3 F (36.3 C) (Oral)    Resp 18    SpO2 98%   Visual Acuity Right Eye Distance:   Left Eye Distance:   Bilateral Distance:    Right Eye Near:   Left Eye Near:    Bilateral Near:     Physical Exam Vitals and nursing note  reviewed.  Constitutional:      Appearance: He is well-developed.  HENT:     Head: Normocephalic.  Cardiovascular:     Rate and Rhythm: Normal rate.  Pulmonary:     Effort: Pulmonary effort is normal.  Abdominal:     General: There is no distension.  Musculoskeletal:        General: Normal range of motion.     Cervical back: Normal range of motion.  Skin:    Comments: Tick removed from right inner buttock,  Intact, multiple  scabs other areas.    Neurological:     Mental Status: He is alert and oriented to person, place, and time.      UC Treatments / Results  Labs (all labs ordered are listed, but only abnormal results are displayed) Labs Reviewed - No data to display  EKG   Radiology No results found.  Procedures Procedures (including critical care time)  Medications Ordered in UC Medications - No data to display  Initial Impression / Assessment and Plan / UC Course  I have reviewed the triage vital signs and the nursing notes.  Pertinent labs & imaging results that were available during my care of the patient were reviewed by me and considered in my medical decision making (see chart for details).     Pt is outside a lot and gets multiple ticks.  Family member given twizzers and shown how to remove. Final Clinical Impressions(s) / UC Diagnoses   Final diagnoses:  Tick bite, initial encounter   Discharge Instructions   None    ED Prescriptions    None     PDMP not reviewed this encounter.  An After Visit Summary was printed and given to the patient.   Fransico Meadow, [place] 01/11/20 1025

## 2020-01-14 DIAGNOSIS — E039 Hypothyroidism, unspecified: Secondary | ICD-10-CM | POA: Diagnosis not present

## 2020-01-15 ENCOUNTER — Other Ambulatory Visit: Payer: Self-pay

## 2020-01-15 ENCOUNTER — Encounter: Payer: Self-pay | Admitting: Cardiology

## 2020-01-15 ENCOUNTER — Ambulatory Visit (INDEPENDENT_AMBULATORY_CARE_PROVIDER_SITE_OTHER): Payer: Medicare Other | Admitting: Cardiology

## 2020-01-15 VITALS — BP 95/56 | HR 72 | Ht 70.0 in | Wt 140.4 lb

## 2020-01-15 DIAGNOSIS — I358 Other nonrheumatic aortic valve disorders: Secondary | ICD-10-CM | POA: Diagnosis not present

## 2020-01-15 DIAGNOSIS — I480 Paroxysmal atrial fibrillation: Secondary | ICD-10-CM | POA: Diagnosis not present

## 2020-01-15 DIAGNOSIS — E7800 Pure hypercholesterolemia, unspecified: Secondary | ICD-10-CM | POA: Diagnosis not present

## 2020-01-15 DIAGNOSIS — I998 Other disorder of circulatory system: Secondary | ICD-10-CM | POA: Diagnosis not present

## 2020-01-15 DIAGNOSIS — Z7189 Other specified counseling: Secondary | ICD-10-CM | POA: Diagnosis not present

## 2020-01-15 NOTE — Patient Instructions (Addendum)
Medication Instructions:  Your Physician recommend you continue on your current medication as directed.    *If you need a refill on your cardiac medications before your next appointment, please call your pharmacy*   Lab Work: None   Testing/Procedures: None   Follow-Up: At [facility], you and your health needs are our priority.  As part of our continuing mission to provide you with exceptional heart care, we have created designated Provider Care Teams.  These Care Teams include your primary Cardiologist (physician) and Advanced Practice Providers (APPs -  Physician Assistants and Nurse Practitioners) who all work together to provide you with the care you need, when you need it.  We recommend signing up for the patient portal called "MyChart".  Sign up information is provided on this After Visit Summary.  MyChart is used to connect with patients for Virtual Visits (Telemedicine).  Patients are able to view lab/test results, encounter notes, upcoming appointments, etc.  Non-urgent messages can be sent to your provider as well.   To learn more about what you can do with MyChart, go to [email or web address].    Your next appointment:   6 month(s)  The format for your next appointment:   In Person  Provider:   Buford Dresser, MD   Other Instructions Check blood pressure occasionally at home. If top number less than 90, or if you feel lightheaded, call me. Options are to stop the alfuzosin or add a medication like midodrine to keep the blood pressure up.

## 2020-01-15 NOTE — Progress Notes (Signed)
Cardiology Office Note:    Date:  01/15/2020   ID:  Gene Hunter, DOB 06-Oct-1925, MRN [id number]  PCP:  [name], MD  Cardiologist:  Buford Dresser, MD  Referring MD: [name], MD   CC: follow up  History of Present Illness:    Gene Hunter is a 86 y.o. 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  Today: Energy level better. Able to work in the yard. No symptoms with activity.   Blood pressure low today. He went to get labs at Dr. Pennie Banter office, not sure if they took blood pressure yesterday. Has upcoming appt with Dr. Shelia Media next month. Denies lightheadedness or dizziness. Working on good oral intake of fluids and nutrition.   Has a BP cuff at home but not used recently. Recommend checking BP several times/week. If systolic less than 90 or he develops symptoms, they will call me. Otherwise they will bring log to upcoming visit with Dr. Shelia Media.  Denies chest pain, shortness of breath at rest or with normal exertion. No PND, orthopnea, LE edema or unexpected weight gain. No syncope or palpitations.  Past Medical History:  Diagnosis Date  . Blindness of right eye   . BPH (benign prostatic hyperplasia)   . Essential hypertension   . HLD (hyperlipidemia)   . Hypothyroidism     Past Surgical History:  Procedure Laterality Date  . bilateral hip replacement    . ESOPHAGEAL MANOMETRY N/A 11/21/2017   Procedure: ESOPHAGEAL MANOMETRY (EM);  Surgeon: Carol Ada, MD;  Location: [facility];  Service: Endoscopy;  Laterality: N/A;  . HERNIA REPAIR    . JOINT REPLACEMENT    . right knee replacement      Current Medications: Current Outpatient Medications on File Prior to Visit  Medication Sig  . alfuzosin (UROXATRAL) 10 MG 24 hr tablet Take 10 mg by mouth daily.  Marland Kitchen atorvastatin (LIPITOR) 20 MG tablet Take 20 mg by mouth daily.  . calcium carbonate (OSCAL) 1500 (600 Ca) MG TABS tablet Take  600 mg of elemental calcium by mouth daily with breakfast.  . cholecalciferol (VITAMIN D) 1000 units tablet Take 1,000 Units by mouth daily.  Marland Kitchen docusate sodium (COLACE) 100 MG capsule Take 100 mg by mouth daily.  Marland Kitchen ELIQUIS 2.5 MG TABS tablet 5 mg 2 (two) times daily.  Marland Kitchen levothyroxine (SYNTHROID) 100 MCG tablet Take 120 mcg by mouth daily before breakfast. Increase to 120 mcg  . Multiple Vitamins-Minerals (MULTIVITAMIN WITH MINERALS) tablet Take 1 tablet by mouth daily.  . polyethylene glycol powder (GLYCOLAX/MIRALAX) 17 GM/SCOOP powder Take by mouth as needed.   No current facility-administered medications on file prior to visit.     Allergies:   Bee venom, Milk-related compounds, Coricidin d cold-flu-sinus [chlorphen-pe-acetaminophen], and Maxzide [hydrochlorothiazide w-triamterene]   Social History   Tobacco Use  . Smoking status: Former Research scientist (life sciences)  . Smokeless tobacco: Never Used  Substance Use Topics  . Alcohol use: No  . Drug use: No    Family History: family history includes Heart attack in his father and mother; Heart disease in his father.  ROS:   Please see the history of present illness.  Additional pertinent ROS otherwise unremarkable.  EKGs/Labs/Other Studies Reviewed:    The following studies were reviewed today: Echocardiogram 10/09/2018 1. The left ventricle has normal systolic function with an ejection fraction of 60-65%. The cavity size was normal. There is severely increased left ventricular wall thickness. Left ventricular diastology could  not be evaluated secondary to atrial  fibrillation. 2. Left atrial size was mildly dilated. 3. The mitral valve is degenerative. Mild thickening of the mitral valve leaflet. Mild calcification of the mitral valve leaflet. There is severe mitral annular calcification present. 4. The tricuspid valve is normal in structure. 5. The aortic valve is tricuspid Severely thickening of the aortic valve Severe calcifcation of the aortic  valve. 6. The pulmonic valve was normal in structure. 7. The inferior vena cava was dilated in size with <50% respiratory variability.  EKG:  EKG is personally reviewed.  The ekg ordered 01/15/2020 demonstrates atrial fibrillation at 72 bpm, LAD, RBBB.  Recent Labs: No results found for requested labs within last 8760 hours.  Recent Lipid Panel    Component Value Date/Time   CHOL 105 01/05/2019 0951   TRIG 42 01/05/2019 0951   HDL 63 01/05/2019 0951   CHOLHDL 1.7 01/05/2019 0951   LDLCALC 34 01/05/2019 0951    Physical Exam:    VS:  BP (!) 95/56   Pulse 72   Ht 5\' 10"  (1.778 m)   Wt 140 lb 6.4 oz (63.7 kg)   SpO2 98%   BMI 20.15 kg/m     Wt Readings from Last 3 Encounters:  01/15/20 140 lb 6.4 oz (63.7 kg)  07/27/19 144 lb 3.2 oz (65.4 kg)  01/10/19 135 lb 7.2 oz (61.4 kg)    GEN: Well nourished, well developed in no acute distress HEENT: Normal, moist mucous membranes NECK: No JVD CARDIAC: irregularly irregular rhythm, normal S1 and S2, no rubs or gallops. 1/6 systolic murmur. VASCULAR: Radial and DP pulses 2+ bilaterally. No carotid bruits RESPIRATORY:  Clear to auscultation without rales, wheezing or rhonchi  ABDOMEN: Soft, non-tender, non-distended MUSCULOSKELETAL:  Ambulates independently SKIN: Warm and dry, no edema NEUROLOGIC:  Alert and oriented x 3. No focal neuro deficits noted. PSYCHIATRIC:  Normal affect    ASSESSMENT:    1. Paroxysmal atrial fibrillation (HCC)   2. Fluctuating blood pressure   3. Hypercholesterolemia   4. Aortic heart murmur   5. Cardiac risk counseling    PLAN:    Atrial fibrillation, paroxysmal vs. Longstanding persistent -on apixaban 2.5 mg BID. See prior discussion. While he meets age cutoff, he does not meet Cr cutoff or weight (though he is close). Have discussed recommendation for 5 mg BID dosing, he wishes to stay on 2.5 mg BID dosing. See prior note re: discussion of this  Intermittent hypotension:  -denies symptoms  today, but blood pressure is low -only on alfuzosin -he will monitor at home, call us if remains low -has upcoming appt with Dr. Shelia Media  Hyperlipidemia: on atorvastatin for prevention. Is tolerating. Given there is some data for rebound events with withdrawal of statin, will continue for now  Murmur: aortic. Reportedly severely thickened with decreased excursion. Reported as no stenosis on echo. Difficult images. Murmur not consistent with severe AS today, but monitor  Cardiac risk counseling and prevention recommendations: -recommend heart healthy/Mediterranean diet, with whole grains, fruits, vegetable, fish, lean meats, nuts, and olive oil. Limit salt. -recommend moderate walking, 3-5 times/week for 30-50 minutes each session. Aim for at least 150 minutes.week. Goal should be pace of 3 miles/hours, or walking 1.5 miles in 30 minutes -recommend avoidance of tobacco products. Avoid excess alcohol.  Plan for follow up: 6 mos or sooner PRN  Medication Adjustments/Labs and Tests Ordered: Current medicines are reviewed at length with the patient today.  Concerns regarding medicines are outlined above.  Orders Placed This Encounter  Procedures  . EKG 12-Lead   No orders of the defined types were placed in this encounter.   Patient Instructions  Medication Instructions:  Your Physician recommend you continue on your current medication as directed.    *If you need a refill on your cardiac medications before your next appointment, please call your pharmacy*   Lab Work: None   Testing/Procedures: None   Follow-Up: At [facility], you and your health needs are our priority.  As part of our continuing mission to provide you with exceptional heart care, we have created designated Provider Care Teams.  These Care Teams include your primary Cardiologist (physician) and Advanced Practice Providers (APPs -  Physician Assistants and Nurse Practitioners) who all work together to provide you  with the care you need, when you need it.  We recommend signing up for the patient portal called "MyChart".  Sign up information is provided on this After Visit Summary.  MyChart is used to connect with patients for Virtual Visits (Telemedicine).  Patients are able to view lab/test results, encounter notes, upcoming appointments, etc.  Non-urgent messages can be sent to your provider as well.   To learn more about what you can do with MyChart, go to [email or web address].    Your next appointment:   6 month(s)  The format for your next appointment:   In Person  Provider:   Buford Dresser, MD   Other Instructions Check blood pressure occasionally at home. If top number less than 90, or if you feel lightheaded, call me. Options are to stop the alfuzosin or add a medication like midodrine to keep the blood pressure up.    Signed, Buford Dresser, MD PhD 01/15/2020     [place]

## 2020-03-03 DIAGNOSIS — E785 Hyperlipidemia, unspecified: Secondary | ICD-10-CM | POA: Diagnosis not present

## 2020-03-03 DIAGNOSIS — E7800 Pure hypercholesterolemia, unspecified: Secondary | ICD-10-CM | POA: Diagnosis not present

## 2020-03-03 DIAGNOSIS — I4891 Unspecified atrial fibrillation: Secondary | ICD-10-CM | POA: Diagnosis not present

## 2020-03-03 DIAGNOSIS — E119 Type 2 diabetes mellitus without complications: Secondary | ICD-10-CM | POA: Diagnosis not present

## 2020-03-03 DIAGNOSIS — E039 Hypothyroidism, unspecified: Secondary | ICD-10-CM | POA: Diagnosis not present

## 2020-03-03 DIAGNOSIS — I10 Essential (primary) hypertension: Secondary | ICD-10-CM | POA: Diagnosis not present

## 2020-04-01 ENCOUNTER — Encounter: Payer: Self-pay | Admitting: Cardiology

## 2020-04-22 ENCOUNTER — Other Ambulatory Visit: Payer: Self-pay

## 2020-04-22 ENCOUNTER — Ambulatory Visit
Admission: EM | Admit: 2020-04-22 | Discharge: 2020-04-22 | Disposition: A | Discharge status: Home / Self Care | Payer: Medicare Other | Attending: Family Medicine | Admitting: Family Medicine

## 2020-04-22 DIAGNOSIS — S90425A Blister (nonthermal), left lesser toe(s), initial encounter: Secondary | ICD-10-CM | POA: Diagnosis not present

## 2020-04-22 NOTE — ED Triage Notes (Signed)
Pt present a blister on his left foot/2nd toe next to the big toe. Pt state he noticed the blister today. Pt denies any drainage or injury to his toe.

## 2020-04-22 NOTE — ED Provider Notes (Signed)
[facility]    CSN: [id number] Arrival date & time: 04/22/20  1133      History   Chief Complaint Chief Complaint  Patient presents with   Blister    left foot/second toe to the big toe    HPI Gene Hunter is a 86 y.o. male.   Patient awoke this morning with a large blister on his left second toe.  There is no pain involved.  He was wearing some dress shoes yesterday which he does not usually wear and then the blister was there this morning.  HPI  Past Medical History:  Diagnosis Date   Blindness of right eye    BPH (benign prostatic hyperplasia)    Essential hypertension    HLD (hyperlipidemia)    Hypothyroidism     Patient Active Problem List   Diagnosis Date Noted   Aortic heart murmur 08/26/2019   Atrial fibrillation ([place]) 10/08/2018   Closed fracture of left hip ([place]) 09/04/2016   Constipation 09/04/2016   Fall 09/04/2016   BPH (benign prostatic hyperplasia) 09/04/2016   Essential hypertension    HLD (hyperlipidemia)    Hypothyroidism     Past Surgical History:  Procedure Laterality Date   bilateral hip replacement     ESOPHAGEAL MANOMETRY N/A 11/21/2017   Procedure: ESOPHAGEAL MANOMETRY (EM);  Surgeon: Carol Ada, MD;  Location: [facility];  Service: Endoscopy;  Laterality: N/A;   HERNIA REPAIR     JOINT REPLACEMENT     right knee replacement         Home Medications    Prior to Admission medications   Medication Sig Start Date End Date Taking? Authorizing Provider  alfuzosin (UROXATRAL) 10 MG 24 hr tablet Take 10 mg by mouth daily. 09/25/18   [provider]  atorvastatin (LIPITOR) 20 MG tablet Take 20 mg by mouth daily.    [provider]  calcium carbonate (OSCAL) 1500 (600 Ca) MG TABS tablet Take 600 mg of elemental calcium by mouth daily with breakfast.    [provider]  cholecalciferol (VITAMIN D) 1000 units tablet Take 1,000 Units by mouth daily.    [provider]    docusate sodium (COLACE) 100 MG capsule Take 100 mg by mouth daily.    [provider]  ELIQUIS 2.5 MG TABS tablet 5 mg 2 (two) times daily. 04/02/19   [provider]  levothyroxine (SYNTHROID) 100 MCG tablet Take 120 mcg by mouth daily before breakfast. Increase to 120 mcg    [provider]  Multiple Vitamins-Minerals (MULTIVITAMIN WITH MINERALS) tablet Take 1 tablet by mouth daily.    [provider]  polyethylene glycol powder (GLYCOLAX/MIRALAX) 17 GM/SCOOP powder Take by mouth as needed. 09/09/16   [provider]    Family History Family History  Problem Relation Age of Onset   Heart disease Father    Heart attack Father        in his 58's to 84's   Heart attack Mother        in her 18's    Social History Social History   Tobacco Use   Smoking status: Former Smoker   Smokeless tobacco: Never Used  Scientific laboratory technician Use: Never used  Substance Use Topics   Alcohol use: No   Drug use: No     Allergies   Bee venom, Milk-related compounds, Coricidin d cold-flu-sinus [chlorphen-pe-acetaminophen], and Maxzide [hydrochlorothiazide w-triamterene]   Review of Systems Review of Systems  Skin: Positive for  wound.       Left second toe  All other systems reviewed and are negative.    Physical Exam Triage Vital Signs ED Triage Vitals  Enc Vitals Group     BP 04/22/20 1320 116/72     Pulse Rate 04/22/20 1320 93     Resp 04/22/20 1320 18     Temp 04/22/20 1320 97.7 F (36.5 C)     Temp Source 04/22/20 1320 Oral     SpO2 04/22/20 1320 97 %     Weight --      Height --      Head Circumference --      Peak Flow --      Pain Score 04/22/20 1321 0     Pain Loc --      Pain Edu? --      Excl. in [place]? --    No data found.  Updated Vital Signs BP 116/72 (BP Location: Left Arm)    Pulse 93    Temp 97.7 F (36.5 C) (Oral)    Resp 18    SpO2 97%   Visual Acuity Right Eye Distance:   Left Eye Distance:   Bilateral  Distance:    Right Eye Near:   Left Eye Near:    Bilateral Near:     Physical Exam Vitals and nursing note reviewed.  Constitutional:      Appearance: Normal appearance.  Skin:    Comments: There is a fairly large blister on the dorsum proximal phalanx of the second toe.  This was aspirated with clear serous fluid obtained.  No evidence of infection.  Blister was dressed with 2 x 2 and [place]  Neurological:     Mental Status: He is alert.      UC Treatments / Results  Labs (all labs ordered are listed, but only abnormal results are displayed) Labs Reviewed - No data to display  EKG   Radiology No results found.  Procedures Procedures (including critical care time)  Medications Ordered in UC Medications - No data to display  Initial Impression / Assessment and Plan / UC Course  I have reviewed the triage vital signs and the nursing notes.  Pertinent labs & imaging results that were available during my care of the patient were reviewed by me and considered in my medical decision making (see chart for details).     Blister, left second toe Final Clinical Impressions(s) / UC Diagnoses   Final diagnoses:  None   Discharge Instructions   None    ED Prescriptions    None     PDMP not reviewed this encounter.   Wardell Honour, MD 04/22/20 1341

## 2020-04-29 DIAGNOSIS — E119 Type 2 diabetes mellitus without complications: Secondary | ICD-10-CM | POA: Diagnosis not present

## 2020-04-29 DIAGNOSIS — B351 Tinea unguium: Secondary | ICD-10-CM | POA: Diagnosis not present

## 2020-04-29 DIAGNOSIS — Z23 Encounter for immunization: Secondary | ICD-10-CM | POA: Diagnosis not present

## 2020-04-29 DIAGNOSIS — L03032 Cellulitis of left toe: Secondary | ICD-10-CM | POA: Diagnosis not present

## 2020-05-05 DIAGNOSIS — B351 Tinea unguium: Secondary | ICD-10-CM | POA: Diagnosis not present

## 2020-05-05 DIAGNOSIS — L03032 Cellulitis of left toe: Secondary | ICD-10-CM | POA: Diagnosis not present

## 2020-05-05 DIAGNOSIS — Z23 Encounter for immunization: Secondary | ICD-10-CM | POA: Diagnosis not present

## 2020-05-20 ENCOUNTER — Other Ambulatory Visit: Payer: Self-pay

## 2020-05-20 ENCOUNTER — Ambulatory Visit (INDEPENDENT_AMBULATORY_CARE_PROVIDER_SITE_OTHER): Payer: Medicare Other | Admitting: Podiatry

## 2020-05-20 DIAGNOSIS — D689 Coagulation defect, unspecified: Secondary | ICD-10-CM | POA: Diagnosis not present

## 2020-05-20 DIAGNOSIS — B351 Tinea unguium: Secondary | ICD-10-CM

## 2020-05-20 NOTE — Progress Notes (Signed)
  Subjective:  Patient ID: Gene Hunter, male    DOB: 02/06/1926,  MRN: [id number]  Chief Complaint  Patient presents with  . Nail Problem    Nail trim 1-5 bilateral  . Foot Problem    Left dorsal foot scab previous blister 1 month duration    86 y.o. male presents with the above complaint. History confirmed with patient. States that there was previously a large scab on the top of the left foot, unsure how it started.  Objective:  Physical Exam: warm, good capillary refill, nail exam onychomycosis of the toenails, no trophic changes or ulcerative lesions. DP pulses palpable, PT pulses palpable and protective sensation intact Left Foot: normal exam, no swelling, tenderness, instability; ligaments intact, full range of motion of all ankle/foot joints. No evident blister, healed eschar loosely adhered to the skin Right Foot: normal exam, no swelling, tenderness, instability; ligaments intact, full range of motion of all ankle/foot joints   No images are attached to the encounter.  Assessment:   1. Onychomycosis   2. Coagulation defect [facility])    Plan:  Patient was evaluated and treated and all questions answered.  Onychomycosis and Coagulation Defect -Nails palliatively debrided secondary to pain  Procedure: Nail Debridement Rationale: Patient meets criteria for routine foot care due to coagulation defect Type of Debridement: manual, sharp debridement. Instrumentation: Nail nipper, rotary burr. Number of Nails: 10  ?Blister -No active lesions, appears fully healed.  Return in about 3 months (around 08/20/2020) for Diabetic Foot Care.

## 2020-07-15 ENCOUNTER — Other Ambulatory Visit: Payer: Self-pay

## 2020-07-15 ENCOUNTER — Encounter: Payer: Self-pay | Admitting: Cardiology

## 2020-07-15 ENCOUNTER — Ambulatory Visit (INDEPENDENT_AMBULATORY_CARE_PROVIDER_SITE_OTHER): Payer: Medicare Other | Admitting: Cardiology

## 2020-07-15 VITALS — BP 126/72 | HR 71 | Ht 71.0 in | Wt 141.0 lb

## 2020-07-15 DIAGNOSIS — I358 Other nonrheumatic aortic valve disorders: Secondary | ICD-10-CM | POA: Diagnosis not present

## 2020-07-15 DIAGNOSIS — I998 Other disorder of circulatory system: Secondary | ICD-10-CM

## 2020-07-15 DIAGNOSIS — I480 Paroxysmal atrial fibrillation: Secondary | ICD-10-CM

## 2020-07-15 DIAGNOSIS — E7800 Pure hypercholesterolemia, unspecified: Secondary | ICD-10-CM

## 2020-07-15 NOTE — Progress Notes (Signed)
Cardiology Office Note:    Date:  07/15/2020   ID:  Gene Hunter, DOB 11-04-1925, MRN [id number]  PCP:  Gene Pretty, MD  Cardiologist:  Gene Dresser, MD  Referring MD: Gene Pretty, MD   CC: follow up  History of Present Illness:    Gene Hunter is a 86 y.o. 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  Today: Here with daughter today. Energy level stable. No syncope. No chest pain or shortness of breath. No swelling in feet since several months ago but went away within a day, thought it might have been a reaction to new socks but then resolved. Appetite is good.  Bruises easily, but no melena/hematochezia/hematuria. Tolerating apixaban.  Denies chest pain, shortness of breath at rest or with normal exertion. No PND, orthopnea, LE edema or unexpected weight gain. No syncope or palpitations.  Past Medical History:  Diagnosis Date   Blindness of right eye    BPH (benign prostatic hyperplasia)    Essential hypertension    HLD (hyperlipidemia)    Hypothyroidism     Past Surgical History:  Procedure Laterality Date   bilateral hip replacement     ESOPHAGEAL MANOMETRY N/A 11/21/2017   Procedure: ESOPHAGEAL MANOMETRY (EM);  Surgeon: Gene Ada, MD;  Location: [facility];  Service: Endoscopy;  Laterality: N/A;   HERNIA REPAIR     JOINT REPLACEMENT     right knee replacement      Current Medications: Current Outpatient Medications on File Prior to Visit  Medication Sig   alfuzosin (UROXATRAL) 10 MG 24 hr tablet Take 10 mg by mouth daily.   amoxicillin-clavulanate (AUGMENTIN) 500-125 MG tablet Take 1 tablet by mouth 2 (two) times daily.   atorvastatin (LIPITOR) 20 MG tablet Take 20 mg by mouth daily.   calcium carbonate (OSCAL) 1500 (600 Ca) MG TABS tablet Take 600 mg of elemental calcium by mouth daily with breakfast.   cholecalciferol (VITAMIN D) 1000 units tablet Take 1,000 Units by mouth daily.    docusate sodium (COLACE) 100 MG capsule Take 100 mg by mouth daily.   ELIQUIS 2.5 MG TABS tablet 5 mg 2 (two) times daily.   levothyroxine (SYNTHROID) 100 MCG tablet Take 120 mcg by mouth daily before breakfast. Increase to 120 mcg   levothyroxine (SYNTHROID) 175 MCG tablet    Multiple Vitamins-Minerals (MULTIVITAMIN WITH MINERALS) tablet Take 1 tablet by mouth daily.   polyethylene glycol powder (GLYCOLAX/MIRALAX) 17 GM/SCOOP powder Take by mouth as needed.   No current facility-administered medications on file prior to visit.     Allergies:   Bee venom, Milk-related compounds, Coricidin d cold-flu-sinus [chlorphen-pe-acetaminophen], and Maxzide [hydrochlorothiazide w-triamterene]   Social History   Tobacco Use   Smoking status: Former Smoker   Smokeless tobacco: Never Used  Scientific laboratory technician Use: Never used  Substance Use Topics   Alcohol use: No   Drug use: No    Family History: family history includes Heart attack in his father and mother; Heart disease in his father.  ROS:   Please see the history of present illness.  Additional pertinent ROS otherwise unremarkable.  EKGs/Labs/Other Studies Reviewed:    The following studies were reviewed today: Echocardiogram 10/09/2018  1. The left ventricle has normal systolic function with an ejection fraction of 60-65%. The cavity size was normal. There is severely increased left ventricular wall thickness. Left ventricular diastology could not be evaluated secondary to atrial  fibrillation.  2. Left atrial  size was mildly dilated.  3. The mitral valve is degenerative. Mild thickening of the mitral valve leaflet. Mild calcification of the mitral valve leaflet. There is severe mitral annular calcification present.  4. The tricuspid valve is normal in structure.  5. The aortic valve is tricuspid Severely thickening of the aortic valve Severe calcifcation of the aortic valve.  6. The pulmonic valve was normal in structure.  7. The  inferior vena cava was dilated in size with <50% respiratory variability.  EKG:  EKG is personally reviewed.  The ekg ordered 07/15/20 demonstrates atrial fibrillation at 71 bpm, LAD, RBBB.  Recent Labs: No results found for requested labs within last 8760 hours.  Recent Lipid Panel    Component Value Date/Time   CHOL 105 01/05/2019 0951   TRIG 42 01/05/2019 0951   HDL 63 01/05/2019 0951   CHOLHDL 1.7 01/05/2019 0951   LDLCALC 34 01/05/2019 0951    Physical Exam:    VS:  BP 126/72   Pulse 71   Ht 5\' 11"  (1.803 m)   Wt 141 lb (64 kg)   BMI 19.67 kg/m     Wt Readings from Last 3 Encounters:  07/15/20 141 lb (64 kg)  01/15/20 140 lb 6.4 oz (63.7 kg)  07/27/19 144 lb 3.2 oz (65.4 kg)    GEN: Well nourished, well developed in no acute distress HEENT: Normal, moist mucous membranes NECK: No JVD CARDIAC: irregularly irregular rhythm, normal S1 and S2, no rubs or gallops. 1/6 systolic murmur. VASCULAR: Radial and DP pulses 2+ bilaterally. No carotid bruits RESPIRATORY:  Clear to auscultation without rales, wheezing or rhonchi  ABDOMEN: Soft, non-tender, non-distended MUSCULOSKELETAL:  Ambulates independently SKIN: Warm and dry, no edema NEUROLOGIC:  Alert and oriented x 3. No focal neuro deficits noted. PSYCHIATRIC:  Normal affect    ASSESSMENT:    1. Paroxysmal atrial fibrillation (HCC)   2. Fluctuating blood pressure   3. Hypercholesterolemia   4. Aortic heart murmur    PLAN:    Atrial fibrillation, paroxysmal vs. Longstanding persistent -on apixaban 2.5 mg BID. See prior discussion. While he meets age cutoff, he does not meet Cr cutoff or weight (though he is close). Have discussed recommendation for 5 mg BID dosing, he wishes to stay on 2.5 mg BID dosing. See prior note re: discussion of this  Intermittent hypotension:  -blood pressure today better than prior -only on alfuzosin  Hyperlipidemia: on atorvastatin for prevention. Is tolerating. Given there is some  data for rebound events with withdrawal of statin, will continue for now  Murmur: aortic. Reportedly severely thickened with decreased excursion. Reported as no stenosis on echo. Difficult images. Murmur not consistent with severe AS today, but monitor  Cardiac risk counseling and prevention recommendations: -recommend heart healthy/Mediterranean diet, with whole grains, fruits, vegetable, fish, lean meats, nuts, and olive oil. Limit salt. -recommend moderate walking, 3-5 times/week for 30-50 minutes each session. Aim for at least 150 minutes.week. Goal should be pace of 3 miles/hours, or walking 1.5 miles in 30 minutes -recommend avoidance of tobacco products. Avoid excess alcohol.  Plan for follow up: 6 mos or sooner PRN  Medication Adjustments/Labs and Tests Ordered: Current medicines are reviewed at length with the patient today.  Concerns regarding medicines are outlined above.  Orders Placed This Encounter  Procedures   EKG 12-Lead   No orders of the defined types were placed in this encounter.   Patient Instructions  Medication Instructions:  Your Physician recommend you continue on your current  medication as directed.    *If you need a refill on your cardiac medications before your next appointment, please call your pharmacy*   Lab Work: None   Testing/Procedures: None   Follow-Up: At [facility], you and your health needs are our priority.  As part of our continuing mission to provide you with exceptional heart care, we have created designated Provider Care Teams.  These Care Teams include your primary Cardiologist (physician) and Advanced Practice Providers (APPs -  Physician Assistants and Nurse Practitioners) who all work together to provide you with the care you need, when you need it.  We recommend signing up for the patient portal called "MyChart".  Sign up information is provided on this After Visit Summary.  MyChart is used to connect with patients for  Virtual Visits (Telemedicine).  Patients are able to view lab/test results, encounter notes, upcoming appointments, etc.  Non-urgent messages can be sent to your provider as well.   To learn more about what you can do with MyChart, go to [email or web address].    Your next appointment:   6 month(s)  The format for your next appointment:   In Person  Provider:   Buford Dresser, MD    Signed, Gene Dresser, MD PhD 07/15/2020     [place]

## 2020-07-15 NOTE — Patient Instructions (Signed)

## 2020-08-20 ENCOUNTER — Encounter: Payer: Self-pay | Admitting: Podiatry

## 2020-08-20 ENCOUNTER — Other Ambulatory Visit: Payer: Self-pay

## 2020-08-20 ENCOUNTER — Ambulatory Visit (INDEPENDENT_AMBULATORY_CARE_PROVIDER_SITE_OTHER): Payer: Medicare Other | Admitting: Podiatry

## 2020-08-20 DIAGNOSIS — D689 Coagulation defect, unspecified: Secondary | ICD-10-CM

## 2020-08-20 DIAGNOSIS — B351 Tinea unguium: Secondary | ICD-10-CM

## 2020-08-20 NOTE — Progress Notes (Signed)
This patient returns to my office for at risk foot care.  This patient requires this care by a professional since this patient will be at risk due to having coagulation defect.  Patient is taking eliquis.This patient is unable to cut nails himself since the patient cannot reach his nails.These nails are painful walking and wearing shoes.  This patient presents for at risk foot care today.  General Appearance  Alert, conversant and in no acute stress.  Vascular  Dorsalis pedis and posterior tibial  pulses are absent  bilaterally.  Capillary return is within normal limits  bilaterally. Cold feet.  Bilaterally.  Absent digital hair.  Neurologic  Senn-Weinstein monofilament wire test within normal limits  bilaterally. Muscle power within normal limits bilaterally.  Nails Thick disfigured discolored nails with subungual debris  from hallux to fifth toes bilaterally. No evidence of bacterial infection or drainage bilaterally.  Orthopedic  No limitations of motion  feet .  No crepitus or effusions noted.  No bony pathology or digital deformities noted.  Skin  normotropic skin with no porokeratosis noted bilaterally.  No signs of infections or ulcers noted.     Onychomycosis  Pain in right toes  Pain in left toes  Consent was obtained for treatment procedures.   Mechanical debridement of nails 1-5  bilaterally performed with a nail nipper.  Filed with dremel without incident.    Return office visit   3 months                   Told patient to return for periodic foot care and evaluation due to potential at risk complications.   Gardiner Barefoot DPM

## 2020-09-01 DIAGNOSIS — I10 Essential (primary) hypertension: Secondary | ICD-10-CM | POA: Diagnosis not present

## 2020-09-01 DIAGNOSIS — E559 Vitamin D deficiency, unspecified: Secondary | ICD-10-CM | POA: Diagnosis not present

## 2020-09-01 DIAGNOSIS — E119 Type 2 diabetes mellitus without complications: Secondary | ICD-10-CM | POA: Diagnosis not present

## 2020-09-01 DIAGNOSIS — E785 Hyperlipidemia, unspecified: Secondary | ICD-10-CM | POA: Diagnosis not present

## 2020-09-01 DIAGNOSIS — E039 Hypothyroidism, unspecified: Secondary | ICD-10-CM | POA: Diagnosis not present

## 2020-09-01 DIAGNOSIS — Z0000 Encounter for general adult medical examination without abnormal findings: Secondary | ICD-10-CM | POA: Diagnosis not present

## 2020-09-01 DIAGNOSIS — R972 Elevated prostate specific antigen [PSA]: Secondary | ICD-10-CM | POA: Diagnosis not present

## 2020-09-01 DIAGNOSIS — E7800 Pure hypercholesterolemia, unspecified: Secondary | ICD-10-CM | POA: Diagnosis not present

## 2020-09-04 DIAGNOSIS — I4891 Unspecified atrial fibrillation: Secondary | ICD-10-CM | POA: Diagnosis not present

## 2020-09-04 DIAGNOSIS — E559 Vitamin D deficiency, unspecified: Secondary | ICD-10-CM | POA: Diagnosis not present

## 2020-09-04 DIAGNOSIS — E039 Hypothyroidism, unspecified: Secondary | ICD-10-CM | POA: Diagnosis not present

## 2020-09-04 DIAGNOSIS — E7800 Pure hypercholesterolemia, unspecified: Secondary | ICD-10-CM | POA: Diagnosis not present

## 2020-09-04 DIAGNOSIS — Z0000 Encounter for general adult medical examination without abnormal findings: Secondary | ICD-10-CM | POA: Diagnosis not present

## 2020-09-04 DIAGNOSIS — E119 Type 2 diabetes mellitus without complications: Secondary | ICD-10-CM | POA: Diagnosis not present

## 2020-10-02 DIAGNOSIS — E039 Hypothyroidism, unspecified: Secondary | ICD-10-CM | POA: Diagnosis not present

## 2020-11-19 ENCOUNTER — Other Ambulatory Visit: Payer: Self-pay

## 2020-11-19 ENCOUNTER — Encounter: Payer: Self-pay | Admitting: Podiatry

## 2020-11-19 ENCOUNTER — Ambulatory Visit (INDEPENDENT_AMBULATORY_CARE_PROVIDER_SITE_OTHER): Payer: Medicare Other | Admitting: Podiatry

## 2020-11-19 DIAGNOSIS — D689 Coagulation defect, unspecified: Secondary | ICD-10-CM

## 2020-11-19 DIAGNOSIS — B351 Tinea unguium: Secondary | ICD-10-CM

## 2020-11-19 DIAGNOSIS — H9190 Unspecified hearing loss, unspecified ear: Secondary | ICD-10-CM | POA: Insufficient documentation

## 2020-11-19 DIAGNOSIS — H5440 Blindness, one eye, unspecified eye: Secondary | ICD-10-CM | POA: Insufficient documentation

## 2020-11-19 DIAGNOSIS — Z09 Encounter for follow-up examination after completed treatment for conditions other than malignant neoplasm: Secondary | ICD-10-CM | POA: Insufficient documentation

## 2020-11-19 DIAGNOSIS — R2689 Other abnormalities of gait and mobility: Secondary | ICD-10-CM | POA: Insufficient documentation

## 2020-11-19 DIAGNOSIS — E7800 Pure hypercholesterolemia, unspecified: Secondary | ICD-10-CM | POA: Insufficient documentation

## 2020-11-19 DIAGNOSIS — E119 Type 2 diabetes mellitus without complications: Secondary | ICD-10-CM | POA: Insufficient documentation

## 2020-11-19 DIAGNOSIS — R634 Abnormal weight loss: Secondary | ICD-10-CM | POA: Insufficient documentation

## 2020-11-19 DIAGNOSIS — Z8781 Personal history of (healed) traumatic fracture: Secondary | ICD-10-CM | POA: Insufficient documentation

## 2020-11-19 DIAGNOSIS — E559 Vitamin D deficiency, unspecified: Secondary | ICD-10-CM | POA: Insufficient documentation

## 2020-11-19 DIAGNOSIS — Z86718 Personal history of other venous thrombosis and embolism: Secondary | ICD-10-CM | POA: Insufficient documentation

## 2020-11-19 DIAGNOSIS — R636 Underweight: Secondary | ICD-10-CM | POA: Insufficient documentation

## 2020-11-19 NOTE — Progress Notes (Signed)
This patient returns to my office for at risk foot care.  This patient requires this care by a professional since this patient will be at risk due to having coagulation defect.  Patient is taking eliquis.This patient is unable to cut nails himself since the patient cannot reach his nails.These nails are painful walking and wearing shoes.  This patient presents for at risk foot care today.  General Appearance  Alert, conversant and in no acute stress.  Vascular  Dorsalis pedis and posterior tibial  pulses are absent  bilaterally.  Capillary return is within normal limits  bilaterally. Cold feet.  Bilaterally.  Absent digital hair.  Neurologic  Senn-Weinstein monofilament wire test within normal limits  bilaterally. Muscle power within normal limits bilaterally.  Nails Thick disfigured discolored nails with subungual debris  from hallux to fifth toes bilaterally. No evidence of bacterial infection or drainage bilaterally.  Orthopedic  No limitations of motion  feet .  No crepitus or effusions noted.  No bony pathology or digital deformities noted.  Skin  normotropic skin with no porokeratosis noted bilaterally.  No signs of infections or ulcers noted.     Onychomycosis  Pain in right toes  Pain in left toes  Consent was obtained for treatment procedures.   Mechanical debridement of nails 1-5  bilaterally performed with a nail nipper.  Filed with dremel without incident.    Return office visit   3 months                   Told patient to return for periodic foot care and evaluation due to potential at risk complications.   Gardiner Barefoot DPM

## 2020-12-19 DIAGNOSIS — E039 Hypothyroidism, unspecified: Secondary | ICD-10-CM | POA: Diagnosis not present

## 2020-12-29 DIAGNOSIS — H44522 Atrophy of globe, left eye: Secondary | ICD-10-CM | POA: Diagnosis not present

## 2020-12-29 DIAGNOSIS — E119 Type 2 diabetes mellitus without complications: Secondary | ICD-10-CM | POA: Diagnosis not present

## 2020-12-29 DIAGNOSIS — Z961 Presence of intraocular lens: Secondary | ICD-10-CM | POA: Diagnosis not present

## 2020-12-29 DIAGNOSIS — H31091 Other chorioretinal scars, right eye: Secondary | ICD-10-CM | POA: Diagnosis not present

## 2020-12-29 DIAGNOSIS — H17821 Peripheral opacity of cornea, right eye: Secondary | ICD-10-CM | POA: Diagnosis not present

## 2021-03-03 ENCOUNTER — Encounter: Payer: Self-pay | Admitting: Podiatry

## 2021-03-03 ENCOUNTER — Other Ambulatory Visit: Payer: Self-pay

## 2021-03-03 ENCOUNTER — Ambulatory Visit (INDEPENDENT_AMBULATORY_CARE_PROVIDER_SITE_OTHER): Payer: Medicare Other | Admitting: Podiatry

## 2021-03-03 DIAGNOSIS — D689 Coagulation defect, unspecified: Secondary | ICD-10-CM

## 2021-03-03 DIAGNOSIS — B351 Tinea unguium: Secondary | ICD-10-CM

## 2021-03-03 NOTE — Progress Notes (Signed)
This patient returns to my office for at risk foot care.  This patient requires this care by a professional since this patient will be at risk due to having coagulation defect.  Patient is taking eliquis.This patient is unable to cut nails himself since the patient cannot reach his nails.These nails are painful walking and wearing shoes.  This patient presents for at risk foot care today.  General Appearance  Alert, conversant and in no acute stress.  Vascular  Dorsalis pedis and posterior tibial  pulses are absent  bilaterally.  Capillary return is within normal limits  bilaterally. Cold feet.  Bilaterally.  Absent digital hair.  Neurologic  Senn-Weinstein monofilament wire test within normal limits  bilaterally. Muscle power within normal limits bilaterally.  Nails Thick disfigured discolored nails with subungual debris  from hallux to fifth toes bilaterally. No evidence of bacterial infection or drainage bilaterally.  Orthopedic  No limitations of motion  feet .  No crepitus or effusions noted.  No bony pathology or digital deformities noted.  Skin  normotropic skin with no porokeratosis noted bilaterally.  No signs of infections or ulcers noted.     Onychomycosis  Pain in right toes  Pain in left toes  Consent was obtained for treatment procedures.   Mechanical debridement of nails 1-5  bilaterally performed with a nail nipper.  Filed with dremel without incident.    Return office visit   3 months                   Told patient to return for periodic foot care and evaluation due to potential at risk complications.   Gardiner Barefoot DPM

## 2021-03-17 DIAGNOSIS — I4891 Unspecified atrial fibrillation: Secondary | ICD-10-CM | POA: Diagnosis not present

## 2021-03-17 DIAGNOSIS — E039 Hypothyroidism, unspecified: Secondary | ICD-10-CM | POA: Diagnosis not present

## 2021-03-17 DIAGNOSIS — E119 Type 2 diabetes mellitus without complications: Secondary | ICD-10-CM | POA: Diagnosis not present

## 2021-03-17 DIAGNOSIS — E7800 Pure hypercholesterolemia, unspecified: Secondary | ICD-10-CM | POA: Diagnosis not present

## 2021-03-24 DIAGNOSIS — E785 Hyperlipidemia, unspecified: Secondary | ICD-10-CM | POA: Diagnosis not present

## 2021-03-24 DIAGNOSIS — I4891 Unspecified atrial fibrillation: Secondary | ICD-10-CM | POA: Diagnosis not present

## 2021-03-24 DIAGNOSIS — E039 Hypothyroidism, unspecified: Secondary | ICD-10-CM | POA: Diagnosis not present

## 2021-03-24 DIAGNOSIS — E119 Type 2 diabetes mellitus without complications: Secondary | ICD-10-CM | POA: Diagnosis not present

## 2021-04-03 ENCOUNTER — Encounter (HOSPITAL_BASED_OUTPATIENT_CLINIC_OR_DEPARTMENT_OTHER): Payer: Self-pay

## 2021-06-03 ENCOUNTER — Encounter: Payer: Self-pay | Admitting: Podiatry

## 2021-06-03 ENCOUNTER — Ambulatory Visit (INDEPENDENT_AMBULATORY_CARE_PROVIDER_SITE_OTHER): Payer: Medicare Other | Admitting: Podiatry

## 2021-06-03 ENCOUNTER — Other Ambulatory Visit: Payer: Self-pay

## 2021-06-03 DIAGNOSIS — B351 Tinea unguium: Secondary | ICD-10-CM | POA: Diagnosis not present

## 2021-06-03 DIAGNOSIS — D689 Coagulation defect, unspecified: Secondary | ICD-10-CM

## 2021-06-03 NOTE — Progress Notes (Signed)
This patient returns to my office for at risk foot care.  This patient requires this care by a professional since this patient will be at risk due to having coagulation defect.  Patient is taking eliquis.This patient is unable to cut nails himself since the patient cannot reach his nails.These nails are painful walking and wearing shoes.  This patient presents for at risk foot care today.  General Appearance  Alert, conversant and in no acute stress.  Vascular  Dorsalis pedis and posterior tibial  pulses are absent  bilaterally.  Capillary return is within normal limits  bilaterally. Cold feet.  Bilaterally.  Absent digital hair.  Neurologic  Senn-Weinstein monofilament wire test within normal limits  bilaterally. Muscle power within normal limits bilaterally.  Nails Thick disfigured discolored nails with subungual debris  from hallux to fifth toes bilaterally. No evidence of bacterial infection or drainage bilaterally.  Orthopedic  No limitations of motion  feet .  No crepitus or effusions noted.  No bony pathology or digital deformities noted.  Skin  normotropic skin with no porokeratosis noted bilaterally.  No signs of infections or ulcers noted.     Onychomycosis  Pain in right toes  Pain in left toes  Consent was obtained for treatment procedures.   Mechanical debridement of nails 1-5  bilaterally performed with a nail nipper.  Filed with dremel without incident.    Return office visit   3 months                   Told patient to return for periodic foot care and evaluation due to potential at risk complications.   Gardiner Barefoot DPM

## 2021-06-30 DIAGNOSIS — R296 Repeated falls: Secondary | ICD-10-CM | POA: Diagnosis not present

## 2021-06-30 DIAGNOSIS — S5002XA Contusion of left elbow, initial encounter: Secondary | ICD-10-CM | POA: Diagnosis not present

## 2021-06-30 DIAGNOSIS — S59902A Unspecified injury of left elbow, initial encounter: Secondary | ICD-10-CM | POA: Diagnosis not present

## 2021-06-30 DIAGNOSIS — R531 Weakness: Secondary | ICD-10-CM | POA: Diagnosis not present

## 2021-07-01 DIAGNOSIS — E785 Hyperlipidemia, unspecified: Secondary | ICD-10-CM | POA: Diagnosis not present

## 2021-07-01 DIAGNOSIS — E1142 Type 2 diabetes mellitus with diabetic polyneuropathy: Secondary | ICD-10-CM | POA: Diagnosis not present

## 2021-07-01 DIAGNOSIS — W19XXXD Unspecified fall, subsequent encounter: Secondary | ICD-10-CM | POA: Diagnosis not present

## 2021-07-01 DIAGNOSIS — R2689 Other abnormalities of gait and mobility: Secondary | ICD-10-CM | POA: Diagnosis not present

## 2021-07-01 DIAGNOSIS — I4891 Unspecified atrial fibrillation: Secondary | ICD-10-CM | POA: Diagnosis not present

## 2021-07-01 DIAGNOSIS — E039 Hypothyroidism, unspecified: Secondary | ICD-10-CM | POA: Diagnosis not present

## 2021-07-01 DIAGNOSIS — G4700 Insomnia, unspecified: Secondary | ICD-10-CM | POA: Diagnosis not present

## 2021-07-01 DIAGNOSIS — E138 Other specified diabetes mellitus with unspecified complications: Secondary | ICD-10-CM | POA: Diagnosis not present

## 2021-07-01 DIAGNOSIS — H9190 Unspecified hearing loss, unspecified ear: Secondary | ICD-10-CM | POA: Diagnosis not present

## 2021-07-01 DIAGNOSIS — I10 Essential (primary) hypertension: Secondary | ICD-10-CM | POA: Diagnosis not present

## 2021-07-01 DIAGNOSIS — I4510 Unspecified right bundle-branch block: Secondary | ICD-10-CM | POA: Diagnosis not present

## 2021-07-01 DIAGNOSIS — Z7901 Long term (current) use of anticoagulants: Secondary | ICD-10-CM | POA: Diagnosis not present

## 2021-07-01 DIAGNOSIS — Z4800 Encounter for change or removal of nonsurgical wound dressing: Secondary | ICD-10-CM | POA: Diagnosis not present

## 2021-07-01 DIAGNOSIS — Z9181 History of falling: Secondary | ICD-10-CM | POA: Diagnosis not present

## 2021-07-01 DIAGNOSIS — M159 Polyosteoarthritis, unspecified: Secondary | ICD-10-CM | POA: Diagnosis not present

## 2021-07-01 DIAGNOSIS — K5904 Chronic idiopathic constipation: Secondary | ICD-10-CM | POA: Diagnosis not present

## 2021-07-01 DIAGNOSIS — S51002D Unspecified open wound of left elbow, subsequent encounter: Secondary | ICD-10-CM | POA: Diagnosis not present

## 2021-07-01 DIAGNOSIS — S5002XD Contusion of left elbow, subsequent encounter: Secondary | ICD-10-CM | POA: Diagnosis not present

## 2021-07-01 DIAGNOSIS — H5462 Unqualified visual loss, left eye, normal vision right eye: Secondary | ICD-10-CM | POA: Diagnosis not present

## 2021-07-01 DIAGNOSIS — N402 Nodular prostate without lower urinary tract symptoms: Secondary | ICD-10-CM | POA: Diagnosis not present

## 2021-07-13 DIAGNOSIS — S51002D Unspecified open wound of left elbow, subsequent encounter: Secondary | ICD-10-CM | POA: Diagnosis not present

## 2021-07-13 DIAGNOSIS — W19XXXD Unspecified fall, subsequent encounter: Secondary | ICD-10-CM | POA: Diagnosis not present

## 2021-07-13 DIAGNOSIS — M159 Polyosteoarthritis, unspecified: Secondary | ICD-10-CM | POA: Diagnosis not present

## 2021-07-13 DIAGNOSIS — E1142 Type 2 diabetes mellitus with diabetic polyneuropathy: Secondary | ICD-10-CM | POA: Diagnosis not present

## 2021-07-13 DIAGNOSIS — S5002XD Contusion of left elbow, subsequent encounter: Secondary | ICD-10-CM | POA: Diagnosis not present

## 2021-07-13 DIAGNOSIS — I10 Essential (primary) hypertension: Secondary | ICD-10-CM | POA: Diagnosis not present

## 2021-07-20 DIAGNOSIS — E1142 Type 2 diabetes mellitus with diabetic polyneuropathy: Secondary | ICD-10-CM | POA: Diagnosis not present

## 2021-07-20 DIAGNOSIS — S5002XD Contusion of left elbow, subsequent encounter: Secondary | ICD-10-CM | POA: Diagnosis not present

## 2021-07-20 DIAGNOSIS — I10 Essential (primary) hypertension: Secondary | ICD-10-CM | POA: Diagnosis not present

## 2021-07-20 DIAGNOSIS — W19XXXD Unspecified fall, subsequent encounter: Secondary | ICD-10-CM | POA: Diagnosis not present

## 2021-07-20 DIAGNOSIS — S51002D Unspecified open wound of left elbow, subsequent encounter: Secondary | ICD-10-CM | POA: Diagnosis not present

## 2021-07-20 DIAGNOSIS — M159 Polyosteoarthritis, unspecified: Secondary | ICD-10-CM | POA: Diagnosis not present

## 2021-07-22 ENCOUNTER — Encounter (HOSPITAL_BASED_OUTPATIENT_CLINIC_OR_DEPARTMENT_OTHER): Payer: Self-pay

## 2021-07-22 DIAGNOSIS — E1142 Type 2 diabetes mellitus with diabetic polyneuropathy: Secondary | ICD-10-CM | POA: Diagnosis not present

## 2021-07-22 DIAGNOSIS — I10 Essential (primary) hypertension: Secondary | ICD-10-CM | POA: Diagnosis not present

## 2021-07-22 DIAGNOSIS — S51002D Unspecified open wound of left elbow, subsequent encounter: Secondary | ICD-10-CM | POA: Diagnosis not present

## 2021-07-22 DIAGNOSIS — W19XXXD Unspecified fall, subsequent encounter: Secondary | ICD-10-CM | POA: Diagnosis not present

## 2021-07-22 DIAGNOSIS — M159 Polyosteoarthritis, unspecified: Secondary | ICD-10-CM | POA: Diagnosis not present

## 2021-07-22 DIAGNOSIS — S5002XD Contusion of left elbow, subsequent encounter: Secondary | ICD-10-CM | POA: Diagnosis not present

## 2021-07-24 DIAGNOSIS — M159 Polyosteoarthritis, unspecified: Secondary | ICD-10-CM | POA: Diagnosis not present

## 2021-07-24 DIAGNOSIS — E1142 Type 2 diabetes mellitus with diabetic polyneuropathy: Secondary | ICD-10-CM | POA: Diagnosis not present

## 2021-07-24 DIAGNOSIS — S5002XD Contusion of left elbow, subsequent encounter: Secondary | ICD-10-CM | POA: Diagnosis not present

## 2021-07-24 DIAGNOSIS — W19XXXD Unspecified fall, subsequent encounter: Secondary | ICD-10-CM | POA: Diagnosis not present

## 2021-07-24 DIAGNOSIS — I10 Essential (primary) hypertension: Secondary | ICD-10-CM | POA: Diagnosis not present

## 2021-07-24 DIAGNOSIS — S51002D Unspecified open wound of left elbow, subsequent encounter: Secondary | ICD-10-CM | POA: Diagnosis not present

## 2021-07-29 DIAGNOSIS — E1142 Type 2 diabetes mellitus with diabetic polyneuropathy: Secondary | ICD-10-CM | POA: Diagnosis not present

## 2021-07-29 DIAGNOSIS — W19XXXD Unspecified fall, subsequent encounter: Secondary | ICD-10-CM | POA: Diagnosis not present

## 2021-07-29 DIAGNOSIS — M159 Polyosteoarthritis, unspecified: Secondary | ICD-10-CM | POA: Diagnosis not present

## 2021-07-29 DIAGNOSIS — S51002D Unspecified open wound of left elbow, subsequent encounter: Secondary | ICD-10-CM | POA: Diagnosis not present

## 2021-07-29 DIAGNOSIS — I10 Essential (primary) hypertension: Secondary | ICD-10-CM | POA: Diagnosis not present

## 2021-07-29 DIAGNOSIS — S5002XD Contusion of left elbow, subsequent encounter: Secondary | ICD-10-CM | POA: Diagnosis not present

## 2021-07-30 DIAGNOSIS — E1142 Type 2 diabetes mellitus with diabetic polyneuropathy: Secondary | ICD-10-CM | POA: Diagnosis not present

## 2021-07-30 DIAGNOSIS — S51002D Unspecified open wound of left elbow, subsequent encounter: Secondary | ICD-10-CM | POA: Diagnosis not present

## 2021-07-30 DIAGNOSIS — W19XXXD Unspecified fall, subsequent encounter: Secondary | ICD-10-CM | POA: Diagnosis not present

## 2021-07-30 DIAGNOSIS — I10 Essential (primary) hypertension: Secondary | ICD-10-CM | POA: Diagnosis not present

## 2021-07-30 DIAGNOSIS — S5002XD Contusion of left elbow, subsequent encounter: Secondary | ICD-10-CM | POA: Diagnosis not present

## 2021-07-30 DIAGNOSIS — M159 Polyosteoarthritis, unspecified: Secondary | ICD-10-CM | POA: Diagnosis not present

## 2021-07-31 DIAGNOSIS — S5002XD Contusion of left elbow, subsequent encounter: Secondary | ICD-10-CM | POA: Diagnosis not present

## 2021-07-31 DIAGNOSIS — R2689 Other abnormalities of gait and mobility: Secondary | ICD-10-CM | POA: Diagnosis not present

## 2021-07-31 DIAGNOSIS — Z7901 Long term (current) use of anticoagulants: Secondary | ICD-10-CM | POA: Diagnosis not present

## 2021-07-31 DIAGNOSIS — Z9181 History of falling: Secondary | ICD-10-CM | POA: Diagnosis not present

## 2021-07-31 DIAGNOSIS — I4891 Unspecified atrial fibrillation: Secondary | ICD-10-CM | POA: Diagnosis not present

## 2021-07-31 DIAGNOSIS — G4700 Insomnia, unspecified: Secondary | ICD-10-CM | POA: Diagnosis not present

## 2021-07-31 DIAGNOSIS — M159 Polyosteoarthritis, unspecified: Secondary | ICD-10-CM | POA: Diagnosis not present

## 2021-07-31 DIAGNOSIS — E039 Hypothyroidism, unspecified: Secondary | ICD-10-CM | POA: Diagnosis not present

## 2021-07-31 DIAGNOSIS — I10 Essential (primary) hypertension: Secondary | ICD-10-CM | POA: Diagnosis not present

## 2021-07-31 DIAGNOSIS — E1142 Type 2 diabetes mellitus with diabetic polyneuropathy: Secondary | ICD-10-CM | POA: Diagnosis not present

## 2021-07-31 DIAGNOSIS — S51002D Unspecified open wound of left elbow, subsequent encounter: Secondary | ICD-10-CM | POA: Diagnosis not present

## 2021-07-31 DIAGNOSIS — H9190 Unspecified hearing loss, unspecified ear: Secondary | ICD-10-CM | POA: Diagnosis not present

## 2021-07-31 DIAGNOSIS — I4510 Unspecified right bundle-branch block: Secondary | ICD-10-CM | POA: Diagnosis not present

## 2021-07-31 DIAGNOSIS — K5904 Chronic idiopathic constipation: Secondary | ICD-10-CM | POA: Diagnosis not present

## 2021-07-31 DIAGNOSIS — N402 Nodular prostate without lower urinary tract symptoms: Secondary | ICD-10-CM | POA: Diagnosis not present

## 2021-07-31 DIAGNOSIS — E138 Other specified diabetes mellitus with unspecified complications: Secondary | ICD-10-CM | POA: Diagnosis not present

## 2021-07-31 DIAGNOSIS — W19XXXD Unspecified fall, subsequent encounter: Secondary | ICD-10-CM | POA: Diagnosis not present

## 2021-07-31 DIAGNOSIS — E785 Hyperlipidemia, unspecified: Secondary | ICD-10-CM | POA: Diagnosis not present

## 2021-07-31 DIAGNOSIS — H5462 Unqualified visual loss, left eye, normal vision right eye: Secondary | ICD-10-CM | POA: Diagnosis not present

## 2021-07-31 DIAGNOSIS — Z4800 Encounter for change or removal of nonsurgical wound dressing: Secondary | ICD-10-CM | POA: Diagnosis not present

## 2021-08-05 DIAGNOSIS — S5002XD Contusion of left elbow, subsequent encounter: Secondary | ICD-10-CM | POA: Diagnosis not present

## 2021-08-05 DIAGNOSIS — E1142 Type 2 diabetes mellitus with diabetic polyneuropathy: Secondary | ICD-10-CM | POA: Diagnosis not present

## 2021-08-05 DIAGNOSIS — M159 Polyosteoarthritis, unspecified: Secondary | ICD-10-CM | POA: Diagnosis not present

## 2021-08-05 DIAGNOSIS — S51002D Unspecified open wound of left elbow, subsequent encounter: Secondary | ICD-10-CM | POA: Diagnosis not present

## 2021-08-05 DIAGNOSIS — I10 Essential (primary) hypertension: Secondary | ICD-10-CM | POA: Diagnosis not present

## 2021-08-05 DIAGNOSIS — W19XXXD Unspecified fall, subsequent encounter: Secondary | ICD-10-CM | POA: Diagnosis not present

## 2021-08-12 DIAGNOSIS — S51002D Unspecified open wound of left elbow, subsequent encounter: Secondary | ICD-10-CM | POA: Diagnosis not present

## 2021-08-12 DIAGNOSIS — I10 Essential (primary) hypertension: Secondary | ICD-10-CM | POA: Diagnosis not present

## 2021-08-12 DIAGNOSIS — W19XXXD Unspecified fall, subsequent encounter: Secondary | ICD-10-CM | POA: Diagnosis not present

## 2021-08-12 DIAGNOSIS — S5002XD Contusion of left elbow, subsequent encounter: Secondary | ICD-10-CM | POA: Diagnosis not present

## 2021-08-12 DIAGNOSIS — M159 Polyosteoarthritis, unspecified: Secondary | ICD-10-CM | POA: Diagnosis not present

## 2021-08-12 DIAGNOSIS — E1142 Type 2 diabetes mellitus with diabetic polyneuropathy: Secondary | ICD-10-CM | POA: Diagnosis not present

## 2021-08-13 DIAGNOSIS — I10 Essential (primary) hypertension: Secondary | ICD-10-CM | POA: Diagnosis not present

## 2021-08-13 DIAGNOSIS — W19XXXD Unspecified fall, subsequent encounter: Secondary | ICD-10-CM | POA: Diagnosis not present

## 2021-08-13 DIAGNOSIS — S51002D Unspecified open wound of left elbow, subsequent encounter: Secondary | ICD-10-CM | POA: Diagnosis not present

## 2021-08-13 DIAGNOSIS — E1142 Type 2 diabetes mellitus with diabetic polyneuropathy: Secondary | ICD-10-CM | POA: Diagnosis not present

## 2021-08-13 DIAGNOSIS — M159 Polyosteoarthritis, unspecified: Secondary | ICD-10-CM | POA: Diagnosis not present

## 2021-08-13 DIAGNOSIS — S5002XD Contusion of left elbow, subsequent encounter: Secondary | ICD-10-CM | POA: Diagnosis not present

## 2021-08-19 DIAGNOSIS — W19XXXD Unspecified fall, subsequent encounter: Secondary | ICD-10-CM | POA: Diagnosis not present

## 2021-08-19 DIAGNOSIS — S51002D Unspecified open wound of left elbow, subsequent encounter: Secondary | ICD-10-CM | POA: Diagnosis not present

## 2021-08-19 DIAGNOSIS — S5002XD Contusion of left elbow, subsequent encounter: Secondary | ICD-10-CM | POA: Diagnosis not present

## 2021-08-19 DIAGNOSIS — E1142 Type 2 diabetes mellitus with diabetic polyneuropathy: Secondary | ICD-10-CM | POA: Diagnosis not present

## 2021-08-19 DIAGNOSIS — I10 Essential (primary) hypertension: Secondary | ICD-10-CM | POA: Diagnosis not present

## 2021-08-19 DIAGNOSIS — M159 Polyosteoarthritis, unspecified: Secondary | ICD-10-CM | POA: Diagnosis not present

## 2021-08-21 DIAGNOSIS — I10 Essential (primary) hypertension: Secondary | ICD-10-CM | POA: Diagnosis not present

## 2021-08-21 DIAGNOSIS — M159 Polyosteoarthritis, unspecified: Secondary | ICD-10-CM | POA: Diagnosis not present

## 2021-08-21 DIAGNOSIS — E1142 Type 2 diabetes mellitus with diabetic polyneuropathy: Secondary | ICD-10-CM | POA: Diagnosis not present

## 2021-08-21 DIAGNOSIS — S51002D Unspecified open wound of left elbow, subsequent encounter: Secondary | ICD-10-CM | POA: Diagnosis not present

## 2021-08-21 DIAGNOSIS — W19XXXD Unspecified fall, subsequent encounter: Secondary | ICD-10-CM | POA: Diagnosis not present

## 2021-08-21 DIAGNOSIS — S5002XD Contusion of left elbow, subsequent encounter: Secondary | ICD-10-CM | POA: Diagnosis not present

## 2021-08-23 DIAGNOSIS — I10 Essential (primary) hypertension: Secondary | ICD-10-CM | POA: Diagnosis not present

## 2021-08-23 DIAGNOSIS — W19XXXD Unspecified fall, subsequent encounter: Secondary | ICD-10-CM | POA: Diagnosis not present

## 2021-08-23 DIAGNOSIS — M159 Polyosteoarthritis, unspecified: Secondary | ICD-10-CM | POA: Diagnosis not present

## 2021-08-23 DIAGNOSIS — S51002D Unspecified open wound of left elbow, subsequent encounter: Secondary | ICD-10-CM | POA: Diagnosis not present

## 2021-08-23 DIAGNOSIS — E1142 Type 2 diabetes mellitus with diabetic polyneuropathy: Secondary | ICD-10-CM | POA: Diagnosis not present

## 2021-08-23 DIAGNOSIS — S5002XD Contusion of left elbow, subsequent encounter: Secondary | ICD-10-CM | POA: Diagnosis not present

## 2021-08-24 DIAGNOSIS — S51002D Unspecified open wound of left elbow, subsequent encounter: Secondary | ICD-10-CM | POA: Diagnosis not present

## 2021-08-24 DIAGNOSIS — M159 Polyosteoarthritis, unspecified: Secondary | ICD-10-CM | POA: Diagnosis not present

## 2021-08-24 DIAGNOSIS — S5002XD Contusion of left elbow, subsequent encounter: Secondary | ICD-10-CM | POA: Diagnosis not present

## 2021-08-24 DIAGNOSIS — I10 Essential (primary) hypertension: Secondary | ICD-10-CM | POA: Diagnosis not present

## 2021-08-24 DIAGNOSIS — E1142 Type 2 diabetes mellitus with diabetic polyneuropathy: Secondary | ICD-10-CM | POA: Diagnosis not present

## 2021-08-24 DIAGNOSIS — W19XXXD Unspecified fall, subsequent encounter: Secondary | ICD-10-CM | POA: Diagnosis not present

## 2021-08-26 DIAGNOSIS — M159 Polyosteoarthritis, unspecified: Secondary | ICD-10-CM | POA: Diagnosis not present

## 2021-08-26 DIAGNOSIS — S5002XD Contusion of left elbow, subsequent encounter: Secondary | ICD-10-CM | POA: Diagnosis not present

## 2021-08-26 DIAGNOSIS — S51002D Unspecified open wound of left elbow, subsequent encounter: Secondary | ICD-10-CM | POA: Diagnosis not present

## 2021-08-26 DIAGNOSIS — I10 Essential (primary) hypertension: Secondary | ICD-10-CM | POA: Diagnosis not present

## 2021-08-26 DIAGNOSIS — W19XXXD Unspecified fall, subsequent encounter: Secondary | ICD-10-CM | POA: Diagnosis not present

## 2021-08-26 DIAGNOSIS — E1142 Type 2 diabetes mellitus with diabetic polyneuropathy: Secondary | ICD-10-CM | POA: Diagnosis not present

## 2021-08-30 DIAGNOSIS — H5462 Unqualified visual loss, left eye, normal vision right eye: Secondary | ICD-10-CM | POA: Diagnosis not present

## 2021-08-30 DIAGNOSIS — E1142 Type 2 diabetes mellitus with diabetic polyneuropathy: Secondary | ICD-10-CM | POA: Diagnosis not present

## 2021-08-30 DIAGNOSIS — I4891 Unspecified atrial fibrillation: Secondary | ICD-10-CM | POA: Diagnosis not present

## 2021-08-30 DIAGNOSIS — E785 Hyperlipidemia, unspecified: Secondary | ICD-10-CM | POA: Diagnosis not present

## 2021-08-30 DIAGNOSIS — I4510 Unspecified right bundle-branch block: Secondary | ICD-10-CM | POA: Diagnosis not present

## 2021-08-30 DIAGNOSIS — K5904 Chronic idiopathic constipation: Secondary | ICD-10-CM | POA: Diagnosis not present

## 2021-08-30 DIAGNOSIS — G4700 Insomnia, unspecified: Secondary | ICD-10-CM | POA: Diagnosis not present

## 2021-08-30 DIAGNOSIS — E039 Hypothyroidism, unspecified: Secondary | ICD-10-CM | POA: Diagnosis not present

## 2021-08-30 DIAGNOSIS — N402 Nodular prostate without lower urinary tract symptoms: Secondary | ICD-10-CM | POA: Diagnosis not present

## 2021-08-30 DIAGNOSIS — H9190 Unspecified hearing loss, unspecified ear: Secondary | ICD-10-CM | POA: Diagnosis not present

## 2021-08-30 DIAGNOSIS — Z9181 History of falling: Secondary | ICD-10-CM | POA: Diagnosis not present

## 2021-08-30 DIAGNOSIS — M159 Polyosteoarthritis, unspecified: Secondary | ICD-10-CM | POA: Diagnosis not present

## 2021-08-30 DIAGNOSIS — Z7901 Long term (current) use of anticoagulants: Secondary | ICD-10-CM | POA: Diagnosis not present

## 2021-08-30 DIAGNOSIS — I10 Essential (primary) hypertension: Secondary | ICD-10-CM | POA: Diagnosis not present

## 2021-09-01 DIAGNOSIS — M159 Polyosteoarthritis, unspecified: Secondary | ICD-10-CM | POA: Diagnosis not present

## 2021-09-01 DIAGNOSIS — I4891 Unspecified atrial fibrillation: Secondary | ICD-10-CM | POA: Diagnosis not present

## 2021-09-01 DIAGNOSIS — I10 Essential (primary) hypertension: Secondary | ICD-10-CM | POA: Diagnosis not present

## 2021-09-01 DIAGNOSIS — Z20822 Contact with and (suspected) exposure to covid-19: Secondary | ICD-10-CM | POA: Diagnosis not present

## 2021-09-01 DIAGNOSIS — E785 Hyperlipidemia, unspecified: Secondary | ICD-10-CM | POA: Diagnosis not present

## 2021-09-01 DIAGNOSIS — I4510 Unspecified right bundle-branch block: Secondary | ICD-10-CM | POA: Diagnosis not present

## 2021-09-01 DIAGNOSIS — E1142 Type 2 diabetes mellitus with diabetic polyneuropathy: Secondary | ICD-10-CM | POA: Diagnosis not present

## 2021-09-04 DIAGNOSIS — M159 Polyosteoarthritis, unspecified: Secondary | ICD-10-CM | POA: Diagnosis not present

## 2021-09-04 DIAGNOSIS — I10 Essential (primary) hypertension: Secondary | ICD-10-CM | POA: Diagnosis not present

## 2021-09-04 DIAGNOSIS — Z125 Encounter for screening for malignant neoplasm of prostate: Secondary | ICD-10-CM | POA: Diagnosis not present

## 2021-09-04 DIAGNOSIS — E1142 Type 2 diabetes mellitus with diabetic polyneuropathy: Secondary | ICD-10-CM | POA: Diagnosis not present

## 2021-09-04 DIAGNOSIS — E039 Hypothyroidism, unspecified: Secondary | ICD-10-CM | POA: Diagnosis not present

## 2021-09-04 DIAGNOSIS — E785 Hyperlipidemia, unspecified: Secondary | ICD-10-CM | POA: Diagnosis not present

## 2021-09-04 DIAGNOSIS — I4510 Unspecified right bundle-branch block: Secondary | ICD-10-CM | POA: Diagnosis not present

## 2021-09-04 DIAGNOSIS — E119 Type 2 diabetes mellitus without complications: Secondary | ICD-10-CM | POA: Diagnosis not present

## 2021-09-04 DIAGNOSIS — E7801 Familial hypercholesterolemia: Secondary | ICD-10-CM | POA: Diagnosis not present

## 2021-09-04 DIAGNOSIS — I4891 Unspecified atrial fibrillation: Secondary | ICD-10-CM | POA: Diagnosis not present

## 2021-09-07 DIAGNOSIS — M159 Polyosteoarthritis, unspecified: Secondary | ICD-10-CM | POA: Diagnosis not present

## 2021-09-07 DIAGNOSIS — I10 Essential (primary) hypertension: Secondary | ICD-10-CM | POA: Diagnosis not present

## 2021-09-07 DIAGNOSIS — I4891 Unspecified atrial fibrillation: Secondary | ICD-10-CM | POA: Diagnosis not present

## 2021-09-07 DIAGNOSIS — E785 Hyperlipidemia, unspecified: Secondary | ICD-10-CM | POA: Diagnosis not present

## 2021-09-07 DIAGNOSIS — I4510 Unspecified right bundle-branch block: Secondary | ICD-10-CM | POA: Diagnosis not present

## 2021-09-07 DIAGNOSIS — E1142 Type 2 diabetes mellitus with diabetic polyneuropathy: Secondary | ICD-10-CM | POA: Diagnosis not present

## 2021-09-08 DIAGNOSIS — E7800 Pure hypercholesterolemia, unspecified: Secondary | ICD-10-CM | POA: Diagnosis not present

## 2021-09-08 DIAGNOSIS — E559 Vitamin D deficiency, unspecified: Secondary | ICD-10-CM | POA: Diagnosis not present

## 2021-09-08 DIAGNOSIS — Z0000 Encounter for general adult medical examination without abnormal findings: Secondary | ICD-10-CM | POA: Diagnosis not present

## 2021-09-08 DIAGNOSIS — I10 Essential (primary) hypertension: Secondary | ICD-10-CM | POA: Diagnosis not present

## 2021-09-08 DIAGNOSIS — E039 Hypothyroidism, unspecified: Secondary | ICD-10-CM | POA: Diagnosis not present

## 2021-09-08 DIAGNOSIS — Z23 Encounter for immunization: Secondary | ICD-10-CM | POA: Diagnosis not present

## 2021-09-08 DIAGNOSIS — I480 Paroxysmal atrial fibrillation: Secondary | ICD-10-CM | POA: Diagnosis not present

## 2021-09-08 DIAGNOSIS — Z7901 Long term (current) use of anticoagulants: Secondary | ICD-10-CM | POA: Diagnosis not present

## 2021-09-08 DIAGNOSIS — E119 Type 2 diabetes mellitus without complications: Secondary | ICD-10-CM | POA: Diagnosis not present

## 2021-09-09 ENCOUNTER — Ambulatory Visit: Payer: Medicare Other | Admitting: Podiatry

## 2021-09-14 DIAGNOSIS — I4510 Unspecified right bundle-branch block: Secondary | ICD-10-CM | POA: Diagnosis not present

## 2021-09-14 DIAGNOSIS — E785 Hyperlipidemia, unspecified: Secondary | ICD-10-CM | POA: Diagnosis not present

## 2021-09-14 DIAGNOSIS — M159 Polyosteoarthritis, unspecified: Secondary | ICD-10-CM | POA: Diagnosis not present

## 2021-09-14 DIAGNOSIS — E1142 Type 2 diabetes mellitus with diabetic polyneuropathy: Secondary | ICD-10-CM | POA: Diagnosis not present

## 2021-09-14 DIAGNOSIS — I10 Essential (primary) hypertension: Secondary | ICD-10-CM | POA: Diagnosis not present

## 2021-09-14 DIAGNOSIS — I4891 Unspecified atrial fibrillation: Secondary | ICD-10-CM | POA: Diagnosis not present

## 2021-09-15 ENCOUNTER — Other Ambulatory Visit: Payer: Self-pay

## 2021-09-15 ENCOUNTER — Encounter: Payer: Self-pay | Admitting: Podiatry

## 2021-09-15 ENCOUNTER — Ambulatory Visit (INDEPENDENT_AMBULATORY_CARE_PROVIDER_SITE_OTHER): Payer: Medicare Other | Admitting: Podiatry

## 2021-09-15 DIAGNOSIS — B351 Tinea unguium: Secondary | ICD-10-CM

## 2021-09-15 DIAGNOSIS — D689 Coagulation defect, unspecified: Secondary | ICD-10-CM

## 2021-09-15 NOTE — Progress Notes (Signed)
This patient returns to my office for at risk foot care.  This patient requires this care by a professional since this patient will be at risk due to having coagulation defect.  Patient is taking eliquis.This patient is unable to cut nails himself since the patient cannot reach his nails.These nails are painful walking and wearing shoes. Patient presents with male caregiver.  This patient presents for at risk foot care today.  General Appearance  Alert, conversant and in no acute stress.  Vascular  Dorsalis pedis and posterior tibial  pulses are absent  bilaterally.  Capillary return is within normal limits  bilaterally. Cold feet.  Bilaterally.  Absent digital hair.  Neurologic  Senn-Weinstein monofilament wire test within normal limits  bilaterally. Muscle power within normal limits bilaterally.  Nails Thick disfigured discolored nails with subungual debris  from hallux to fifth toes bilaterally. No evidence of bacterial infection or drainage bilaterally.  Orthopedic  No limitations of motion  feet .  No crepitus or effusions noted.  No bony pathology .  Hammer toes  B/L.  Skin  normotropic skin with no porokeratosis noted bilaterally.  No signs of infections or ulcers noted.     Onychomycosis  Pain in right toes  Pain in left toes  Consent was obtained for treatment procedures.   Mechanical debridement of nails 1-5  bilaterally performed with a nail nipper.  Filed with dremel without incident.    Return office visit   10 weeks                  Told patient to return for periodic foot care and evaluation due to potential at risk complications.   Onie Hayashi DPM  

## 2021-09-17 DIAGNOSIS — E785 Hyperlipidemia, unspecified: Secondary | ICD-10-CM | POA: Diagnosis not present

## 2021-09-17 DIAGNOSIS — I4891 Unspecified atrial fibrillation: Secondary | ICD-10-CM | POA: Diagnosis not present

## 2021-09-17 DIAGNOSIS — I10 Essential (primary) hypertension: Secondary | ICD-10-CM | POA: Diagnosis not present

## 2021-09-17 DIAGNOSIS — M159 Polyosteoarthritis, unspecified: Secondary | ICD-10-CM | POA: Diagnosis not present

## 2021-09-17 DIAGNOSIS — I4510 Unspecified right bundle-branch block: Secondary | ICD-10-CM | POA: Diagnosis not present

## 2021-09-17 DIAGNOSIS — E1142 Type 2 diabetes mellitus with diabetic polyneuropathy: Secondary | ICD-10-CM | POA: Diagnosis not present

## 2021-09-22 DIAGNOSIS — I4891 Unspecified atrial fibrillation: Secondary | ICD-10-CM | POA: Diagnosis not present

## 2021-09-22 DIAGNOSIS — M159 Polyosteoarthritis, unspecified: Secondary | ICD-10-CM | POA: Diagnosis not present

## 2021-09-22 DIAGNOSIS — I10 Essential (primary) hypertension: Secondary | ICD-10-CM | POA: Diagnosis not present

## 2021-09-22 DIAGNOSIS — E1142 Type 2 diabetes mellitus with diabetic polyneuropathy: Secondary | ICD-10-CM | POA: Diagnosis not present

## 2021-09-22 DIAGNOSIS — I4510 Unspecified right bundle-branch block: Secondary | ICD-10-CM | POA: Diagnosis not present

## 2021-09-22 DIAGNOSIS — E785 Hyperlipidemia, unspecified: Secondary | ICD-10-CM | POA: Diagnosis not present

## 2021-09-23 DIAGNOSIS — E785 Hyperlipidemia, unspecified: Secondary | ICD-10-CM | POA: Diagnosis not present

## 2021-09-23 DIAGNOSIS — E1142 Type 2 diabetes mellitus with diabetic polyneuropathy: Secondary | ICD-10-CM | POA: Diagnosis not present

## 2021-09-23 DIAGNOSIS — M159 Polyosteoarthritis, unspecified: Secondary | ICD-10-CM | POA: Diagnosis not present

## 2021-09-23 DIAGNOSIS — I4891 Unspecified atrial fibrillation: Secondary | ICD-10-CM | POA: Diagnosis not present

## 2021-09-23 DIAGNOSIS — I4510 Unspecified right bundle-branch block: Secondary | ICD-10-CM | POA: Diagnosis not present

## 2021-09-23 DIAGNOSIS — I10 Essential (primary) hypertension: Secondary | ICD-10-CM | POA: Diagnosis not present

## 2021-09-28 DIAGNOSIS — M159 Polyosteoarthritis, unspecified: Secondary | ICD-10-CM | POA: Diagnosis not present

## 2021-09-28 DIAGNOSIS — E1142 Type 2 diabetes mellitus with diabetic polyneuropathy: Secondary | ICD-10-CM | POA: Diagnosis not present

## 2021-09-28 DIAGNOSIS — I4510 Unspecified right bundle-branch block: Secondary | ICD-10-CM | POA: Diagnosis not present

## 2021-09-28 DIAGNOSIS — I10 Essential (primary) hypertension: Secondary | ICD-10-CM | POA: Diagnosis not present

## 2021-09-28 DIAGNOSIS — I4891 Unspecified atrial fibrillation: Secondary | ICD-10-CM | POA: Diagnosis not present

## 2021-09-28 DIAGNOSIS — E785 Hyperlipidemia, unspecified: Secondary | ICD-10-CM | POA: Diagnosis not present

## 2021-09-29 DIAGNOSIS — K5904 Chronic idiopathic constipation: Secondary | ICD-10-CM | POA: Diagnosis not present

## 2021-09-29 DIAGNOSIS — I4891 Unspecified atrial fibrillation: Secondary | ICD-10-CM | POA: Diagnosis not present

## 2021-09-29 DIAGNOSIS — N402 Nodular prostate without lower urinary tract symptoms: Secondary | ICD-10-CM | POA: Diagnosis not present

## 2021-09-29 DIAGNOSIS — H9190 Unspecified hearing loss, unspecified ear: Secondary | ICD-10-CM | POA: Diagnosis not present

## 2021-09-29 DIAGNOSIS — E1142 Type 2 diabetes mellitus with diabetic polyneuropathy: Secondary | ICD-10-CM | POA: Diagnosis not present

## 2021-09-29 DIAGNOSIS — Z9181 History of falling: Secondary | ICD-10-CM | POA: Diagnosis not present

## 2021-09-29 DIAGNOSIS — E785 Hyperlipidemia, unspecified: Secondary | ICD-10-CM | POA: Diagnosis not present

## 2021-09-29 DIAGNOSIS — Z7901 Long term (current) use of anticoagulants: Secondary | ICD-10-CM | POA: Diagnosis not present

## 2021-09-29 DIAGNOSIS — E039 Hypothyroidism, unspecified: Secondary | ICD-10-CM | POA: Diagnosis not present

## 2021-09-29 DIAGNOSIS — I4510 Unspecified right bundle-branch block: Secondary | ICD-10-CM | POA: Diagnosis not present

## 2021-09-29 DIAGNOSIS — I10 Essential (primary) hypertension: Secondary | ICD-10-CM | POA: Diagnosis not present

## 2021-09-29 DIAGNOSIS — H5462 Unqualified visual loss, left eye, normal vision right eye: Secondary | ICD-10-CM | POA: Diagnosis not present

## 2021-09-29 DIAGNOSIS — M159 Polyosteoarthritis, unspecified: Secondary | ICD-10-CM | POA: Diagnosis not present

## 2021-09-29 DIAGNOSIS — G4700 Insomnia, unspecified: Secondary | ICD-10-CM | POA: Diagnosis not present

## 2021-10-05 DIAGNOSIS — I10 Essential (primary) hypertension: Secondary | ICD-10-CM | POA: Diagnosis not present

## 2021-10-05 DIAGNOSIS — E785 Hyperlipidemia, unspecified: Secondary | ICD-10-CM | POA: Diagnosis not present

## 2021-10-05 DIAGNOSIS — I4510 Unspecified right bundle-branch block: Secondary | ICD-10-CM | POA: Diagnosis not present

## 2021-10-05 DIAGNOSIS — E1142 Type 2 diabetes mellitus with diabetic polyneuropathy: Secondary | ICD-10-CM | POA: Diagnosis not present

## 2021-10-05 DIAGNOSIS — M159 Polyosteoarthritis, unspecified: Secondary | ICD-10-CM | POA: Diagnosis not present

## 2021-10-05 DIAGNOSIS — I4891 Unspecified atrial fibrillation: Secondary | ICD-10-CM | POA: Diagnosis not present

## 2021-10-06 DIAGNOSIS — Z20822 Contact with and (suspected) exposure to covid-19: Secondary | ICD-10-CM | POA: Diagnosis not present

## 2021-10-12 ENCOUNTER — Ambulatory Visit (INDEPENDENT_AMBULATORY_CARE_PROVIDER_SITE_OTHER): Payer: Medicare Other | Admitting: Cardiology

## 2021-10-12 ENCOUNTER — Other Ambulatory Visit: Payer: Self-pay

## 2021-10-12 ENCOUNTER — Encounter (HOSPITAL_BASED_OUTPATIENT_CLINIC_OR_DEPARTMENT_OTHER): Payer: Self-pay | Admitting: Cardiology

## 2021-10-12 VITALS — BP 120/72 | HR 75 | Ht 71.0 in | Wt 141.1 lb

## 2021-10-12 DIAGNOSIS — I358 Other nonrheumatic aortic valve disorders: Secondary | ICD-10-CM | POA: Diagnosis not present

## 2021-10-12 DIAGNOSIS — Z7189 Other specified counseling: Secondary | ICD-10-CM

## 2021-10-12 DIAGNOSIS — I480 Paroxysmal atrial fibrillation: Secondary | ICD-10-CM | POA: Diagnosis not present

## 2021-10-12 DIAGNOSIS — I998 Other disorder of circulatory system: Secondary | ICD-10-CM

## 2021-10-12 DIAGNOSIS — E7800 Pure hypercholesterolemia, unspecified: Secondary | ICD-10-CM

## 2021-10-12 NOTE — Progress Notes (Incomplete)
Cardiology Office Note:    Date:  10/12/2021   ID:  JR MILLIRON, DOB 1926/07/19, MRN [id number]  PCP:  [name], MD  Cardiologist:  Buford Dresser, MD  Referring MD: [name], MD   CC: follow up  History of Present Illness:    Gene Hunter is a 88 y.o. 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  Today:  He is accompanied by his daughter. Overall, he is feeling good. He reports his breathing has been normal.   They report his blood pressure has been controlled at other clinic visits as well.  In the past month, he has participated in PT. He walked down the driveway about 1/4 of a mile. He was fatigued but otherwise asymptomatic.  He denies any palpitations, chest pain, or peripheral edema. No lightheadedness, headaches, syncope, orthopnea, or PND.   Past Medical History:  Diagnosis Date   Blindness of right eye    BPH (benign prostatic hyperplasia)    Essential hypertension    HLD (hyperlipidemia)    Hypothyroidism     Past Surgical History:  Procedure Laterality Date   bilateral hip replacement     ESOPHAGEAL MANOMETRY N/A 11/21/2017   Procedure: ESOPHAGEAL MANOMETRY (EM);  Surgeon: Carol Ada, MD;  Location: [facility];  Service: Endoscopy;  Laterality: N/A;   HERNIA REPAIR     JOINT REPLACEMENT     right knee replacement      Current Medications: Current Outpatient Medications on File Prior to Visit  Medication Sig   alfuzosin (UROXATRAL) 10 MG 24 hr tablet Take 10 mg by mouth daily.   apixaban (ELIQUIS) 5 MG TABS tablet Take 1 tablet by mouth 2 (two) times daily.   atorvastatin (LIPITOR) 20 MG tablet Take 20 mg by mouth daily.   Calcium 600-400 MG-UNIT CHEW 1 tablet with meals   calcium carbonate (OSCAL) 1500 (600 Ca) MG TABS tablet Take 600 mg of elemental calcium by mouth daily with breakfast.   cholecalciferol (VITAMIN D) 1000 units tablet Take 1,000 Units by mouth daily.    docusate sodium (COLACE) 100 MG capsule Take 100 mg by mouth daily.   levothyroxine (SYNTHROID) 150 MCG tablet Take 150 mcg by mouth daily.   Multiple Vitamins-Minerals (MULTIVITAMIN WITH MINERALS) tablet Take 1 tablet by mouth daily.   No current facility-administered medications on file prior to visit.     Allergies:   Bee venom, Milk-related compounds, Coricidin d cold-flu-sinus [chlorphen-pe-acetaminophen], and Maxzide [hydrochlorothiazide w-triamterene]   Social History   Tobacco Use   Smoking status: Former   Smokeless tobacco: Never  Scientific laboratory technician Use: Never used  Substance Use Topics   Alcohol use: No   Drug use: No    Family History: family history includes Heart attack in his father and mother; Heart disease in his father.  ROS:   Please see the history of present illness.   (+) Fatigue Additional pertinent ROS otherwise unremarkable.  EKGs/Labs/Other Studies Reviewed:    The following studies were reviewed today:  Echocardiogram 10/09/2018  1. The left ventricle has normal systolic function with an ejection fraction of 60-65%. The cavity size was normal. There is severely increased left ventricular wall thickness. Left ventricular diastology could not be evaluated secondary to atrial  fibrillation.  2. Left atrial size was mildly dilated.  3. The mitral valve is degenerative. Mild thickening of the mitral valve leaflet. Mild calcification of the mitral valve leaflet. There is severe mitral  annular calcification present.  4. The tricuspid valve is normal in structure.  5. The aortic valve is tricuspid Severely thickening of the aortic valve Severe calcifcation of the aortic valve.  6. The pulmonic valve was normal in structure.  7. The inferior vena cava was dilated in size with <50% respiratory variability.  Left LE Venous Doppler 10/05/2016: Summary:   - Findings consistent with acute deep vein thrombosis involving the    left posterial tibial vein.  -  No evidence of Baker&'s cyst on the left.  EKG:  EKG is personally reviewed.   10/12/2021: *** 07/15/20: atrial fibrillation at 71 bpm, LAD, RBBB.  Recent Labs: No results found for requested labs within last 8760 hours.   Recent Lipid Panel    Component Value Date/Time   CHOL 105 01/05/2019 0951   TRIG 42 01/05/2019 0951   HDL 63 01/05/2019 0951   CHOLHDL 1.7 01/05/2019 0951   LDLCALC 34 01/05/2019 0951    Physical Exam:    VS:  BP 120/72 (BP Location: Left Arm, Patient Position: Sitting, Cuff Size: Normal)    Pulse 75    Ht '5\' 11"'$  (1.803 m)    Wt 141 lb 1.6 oz (64 kg)    BMI 19.68 kg/m     Wt Readings from Last 3 Encounters:  10/12/21 141 lb 1.6 oz (64 kg)  07/15/20 141 lb (64 kg)  01/15/20 140 lb 6.4 oz (63.7 kg)    GEN: Well nourished, well developed in no acute distress HEENT: Normal, moist mucous membranes NECK: No JVD CARDIAC: ***irregularly irregular rhythm, normal S1 and S2, no rubs or gallops. ***1/6 systolic murmur. VASCULAR: Radial and DP pulses 2+ bilaterally. No carotid bruits RESPIRATORY:  Clear to auscultation without rales, wheezing or rhonchi  ABDOMEN: Soft, non-tender, non-distended MUSCULOSKELETAL:  Ambulates independently SKIN: Warm and dry, no edema NEUROLOGIC:  Alert and oriented x 3. No focal neuro deficits noted. PSYCHIATRIC:  Normal affect    ASSESSMENT:    No diagnosis found.  PLAN:    Atrial fibrillation, paroxysmal vs. Longstanding persistent -on apixaban 2.5 mg BID. See prior discussion. While he meets age cutoff, he does not meet Cr cutoff or weight (though he is close). Have discussed recommendation for 5 mg BID dosing, he wishes to stay on 2.5 mg BID dosing. See prior note re: discussion of this  Intermittent hypotension:  -blood pressure today better than prior -only on alfuzosin  Hyperlipidemia: on atorvastatin for prevention. Is tolerating. Given there is some data for rebound events with withdrawal of statin, will continue for  now  Murmur: aortic. Reportedly severely thickened with decreased excursion. Reported as no stenosis on echo. Difficult images. Murmur not consistent with severe AS today, but monitor  Cardiac risk counseling and prevention recommendations: -recommend heart healthy/Mediterranean diet, with whole grains, fruits, vegetable, fish, lean meats, nuts, and olive oil. Limit salt. -recommend moderate walking, 3-5 times/week for 30-50 minutes each session. Aim for at least 150 minutes.week. Goal should be pace of 3 miles/hours, or walking 1.5 miles in 30 minutes -recommend avoidance of tobacco products. Avoid excess alcohol.  Plan for follow up: 1 year or sooner PRN  Medication Adjustments/Labs and Tests Ordered: Current medicines are reviewed at length with the patient today.  Concerns regarding medicines are outlined above.   No orders of the defined types were placed in this encounter.  No orders of the defined types were placed in this encounter.  There are no Patient Instructions on file for this visit.  I,Mathew Stumpf,acting as a Education administrator for [employer], MD.,have documented all relevant documentation on the behalf of Buford Dresser, MD,as directed by  Buford Dresser, MD while in the presence of Buford Dresser, MD.  ***  Signed, Buford Dresser, MD PhD 10/12/2021     [place]

## 2021-10-12 NOTE — Patient Instructions (Signed)

## 2021-10-13 ENCOUNTER — Ambulatory Visit (HOSPITAL_BASED_OUTPATIENT_CLINIC_OR_DEPARTMENT_OTHER): Payer: Medicare Other | Admitting: Cardiology

## 2021-10-13 DIAGNOSIS — I4891 Unspecified atrial fibrillation: Secondary | ICD-10-CM | POA: Diagnosis not present

## 2021-10-13 DIAGNOSIS — I4510 Unspecified right bundle-branch block: Secondary | ICD-10-CM | POA: Diagnosis not present

## 2021-10-13 DIAGNOSIS — M159 Polyosteoarthritis, unspecified: Secondary | ICD-10-CM | POA: Diagnosis not present

## 2021-10-13 DIAGNOSIS — E785 Hyperlipidemia, unspecified: Secondary | ICD-10-CM | POA: Diagnosis not present

## 2021-10-13 DIAGNOSIS — I10 Essential (primary) hypertension: Secondary | ICD-10-CM | POA: Diagnosis not present

## 2021-10-13 DIAGNOSIS — E1142 Type 2 diabetes mellitus with diabetic polyneuropathy: Secondary | ICD-10-CM | POA: Diagnosis not present

## 2021-10-19 DIAGNOSIS — I4510 Unspecified right bundle-branch block: Secondary | ICD-10-CM | POA: Diagnosis not present

## 2021-10-19 DIAGNOSIS — I4891 Unspecified atrial fibrillation: Secondary | ICD-10-CM | POA: Diagnosis not present

## 2021-10-19 DIAGNOSIS — E1142 Type 2 diabetes mellitus with diabetic polyneuropathy: Secondary | ICD-10-CM | POA: Diagnosis not present

## 2021-10-19 DIAGNOSIS — I10 Essential (primary) hypertension: Secondary | ICD-10-CM | POA: Diagnosis not present

## 2021-10-19 DIAGNOSIS — E785 Hyperlipidemia, unspecified: Secondary | ICD-10-CM | POA: Diagnosis not present

## 2021-10-19 DIAGNOSIS — M159 Polyosteoarthritis, unspecified: Secondary | ICD-10-CM | POA: Diagnosis not present

## 2021-10-22 DIAGNOSIS — Z20822 Contact with and (suspected) exposure to covid-19: Secondary | ICD-10-CM | POA: Diagnosis not present

## 2021-10-26 DIAGNOSIS — I10 Essential (primary) hypertension: Secondary | ICD-10-CM | POA: Diagnosis not present

## 2021-10-26 DIAGNOSIS — M159 Polyosteoarthritis, unspecified: Secondary | ICD-10-CM | POA: Diagnosis not present

## 2021-10-26 DIAGNOSIS — I4891 Unspecified atrial fibrillation: Secondary | ICD-10-CM | POA: Diagnosis not present

## 2021-10-26 DIAGNOSIS — E785 Hyperlipidemia, unspecified: Secondary | ICD-10-CM | POA: Diagnosis not present

## 2021-10-26 DIAGNOSIS — E1142 Type 2 diabetes mellitus with diabetic polyneuropathy: Secondary | ICD-10-CM | POA: Diagnosis not present

## 2021-10-26 DIAGNOSIS — I4510 Unspecified right bundle-branch block: Secondary | ICD-10-CM | POA: Diagnosis not present

## 2021-11-04 DIAGNOSIS — Z20822 Contact with and (suspected) exposure to covid-19: Secondary | ICD-10-CM | POA: Diagnosis not present

## 2021-11-10 DIAGNOSIS — Z20822 Contact with and (suspected) exposure to covid-19: Secondary | ICD-10-CM | POA: Diagnosis not present

## 2021-11-19 DIAGNOSIS — Z20822 Contact with and (suspected) exposure to covid-19: Secondary | ICD-10-CM | POA: Diagnosis not present

## 2021-11-24 ENCOUNTER — Encounter: Payer: Self-pay | Admitting: Podiatry

## 2021-11-24 ENCOUNTER — Ambulatory Visit (INDEPENDENT_AMBULATORY_CARE_PROVIDER_SITE_OTHER): Payer: Medicare Other | Admitting: Podiatry

## 2021-11-24 DIAGNOSIS — E119 Type 2 diabetes mellitus without complications: Secondary | ICD-10-CM | POA: Diagnosis not present

## 2021-11-24 DIAGNOSIS — D689 Coagulation defect, unspecified: Secondary | ICD-10-CM | POA: Diagnosis not present

## 2021-11-24 DIAGNOSIS — B351 Tinea unguium: Secondary | ICD-10-CM | POA: Diagnosis not present

## 2021-11-24 NOTE — Progress Notes (Signed)
This patient returns to my office for at risk foot care.  This patient requires this care by a professional since this patient will be at risk due to having coagulation defect.  Patient is taking eliquis.This patient is unable to cut nails himself since the patient cannot reach his nails.These nails are painful walking and wearing shoes. Patient presents with male caregiver.  This patient presents for at risk foot care today.  General Appearance  Alert, conversant and in no acute stress.  Vascular  Dorsalis pedis and posterior tibial  pulses are absent  bilaterally.  Capillary return is within normal limits  bilaterally. Cold feet.  Bilaterally.  Absent digital hair.  Neurologic  Senn-Weinstein monofilament wire test within normal limits  bilaterally. Muscle power within normal limits bilaterally.  Nails Thick disfigured discolored nails with subungual debris  from hallux to fifth toes bilaterally. No evidence of bacterial infection or drainage bilaterally.  Orthopedic  No limitations of motion  feet .  No crepitus or effusions noted.  No bony pathology .  Hammer toes  B/L.  Skin  normotropic skin with no porokeratosis noted bilaterally.  No signs of infections or ulcers noted.     Onychomycosis  Pain in right toes  Pain in left toes  Consent was obtained for treatment procedures.   Mechanical debridement of nails 1-5  bilaterally performed with a nail nipper.  Filed with dremel without incident.    Return office visit   10 weeks                  Told patient to return for periodic foot care and evaluation due to potential at risk complications.   Gene Hunter DPM  

## 2021-12-02 DIAGNOSIS — H59811 Chorioretinal scars after surgery for detachment, right eye: Secondary | ICD-10-CM | POA: Diagnosis not present

## 2021-12-02 DIAGNOSIS — H17821 Peripheral opacity of cornea, right eye: Secondary | ICD-10-CM | POA: Diagnosis not present

## 2021-12-02 DIAGNOSIS — Z961 Presence of intraocular lens: Secondary | ICD-10-CM | POA: Diagnosis not present

## 2021-12-02 DIAGNOSIS — E119 Type 2 diabetes mellitus without complications: Secondary | ICD-10-CM | POA: Diagnosis not present

## 2021-12-09 DIAGNOSIS — Z20822 Contact with and (suspected) exposure to covid-19: Secondary | ICD-10-CM | POA: Diagnosis not present

## 2021-12-17 DIAGNOSIS — Z20822 Contact with and (suspected) exposure to covid-19: Secondary | ICD-10-CM | POA: Diagnosis not present

## 2021-12-22 DIAGNOSIS — R410 Disorientation, unspecified: Secondary | ICD-10-CM | POA: Diagnosis not present

## 2021-12-22 DIAGNOSIS — R296 Repeated falls: Secondary | ICD-10-CM | POA: Diagnosis not present

## 2021-12-22 DIAGNOSIS — M533 Sacrococcygeal disorders, not elsewhere classified: Secondary | ICD-10-CM | POA: Diagnosis not present

## 2021-12-22 DIAGNOSIS — L89312 Pressure ulcer of right buttock, stage 2: Secondary | ICD-10-CM | POA: Diagnosis not present

## 2021-12-22 DIAGNOSIS — L89322 Pressure ulcer of left buttock, stage 2: Secondary | ICD-10-CM | POA: Diagnosis not present

## 2022-02-03 ENCOUNTER — Encounter: Payer: Self-pay | Admitting: Podiatry

## 2022-02-03 ENCOUNTER — Ambulatory Visit (INDEPENDENT_AMBULATORY_CARE_PROVIDER_SITE_OTHER): Payer: Medicare Other | Admitting: Podiatry

## 2022-02-03 DIAGNOSIS — E119 Type 2 diabetes mellitus without complications: Secondary | ICD-10-CM

## 2022-02-03 DIAGNOSIS — D689 Coagulation defect, unspecified: Secondary | ICD-10-CM | POA: Diagnosis not present

## 2022-02-03 DIAGNOSIS — B351 Tinea unguium: Secondary | ICD-10-CM | POA: Diagnosis not present

## 2022-02-03 NOTE — Progress Notes (Signed)
This patient returns to my office for at risk foot care.  This patient requires this care by a professional since this patient will be at risk due to having coagulation defect.  Patient is taking eliquis.This patient is unable to cut nails himself since the patient cannot reach his nails.These nails are painful walking and wearing shoes. Patient presents with male caregiver.  This patient presents for at risk foot care today.  General Appearance  Alert, conversant and in no acute stress.  Vascular  Dorsalis pedis and posterior tibial  pulses are absent  bilaterally.  Capillary return is within normal limits  bilaterally. Cold feet.  Bilaterally.  Absent digital hair.  Neurologic  Senn-Weinstein monofilament wire test within normal limits  bilaterally. Muscle power within normal limits bilaterally.  Nails Thick disfigured discolored nails with subungual debris  from hallux to fifth toes bilaterally. No evidence of bacterial infection or drainage bilaterally.  Orthopedic  No limitations of motion  feet .  No crepitus or effusions noted.  No bony pathology .  Hammer toes  B/L.  Skin  normotropic skin with no porokeratosis noted bilaterally.  No signs of infections or ulcers noted.     Onychomycosis  Pain in right toes  Pain in left toes  Consent was obtained for treatment procedures.   Mechanical debridement of nails 1-5  bilaterally performed with a nail nipper.  Filed with dremel without incident.    Return office visit   10 weeks                  Told patient to return for periodic foot care and evaluation due to potential at risk complications.   Gardiner Barefoot DPM

## 2022-02-08 ENCOUNTER — Emergency Department (HOSPITAL_COMMUNITY): Payer: Medicare Other

## 2022-02-08 ENCOUNTER — Encounter (HOSPITAL_COMMUNITY): Payer: Self-pay | Admitting: Emergency Medicine

## 2022-02-08 ENCOUNTER — Inpatient Hospital Stay (HOSPITAL_COMMUNITY)
Admission: EM | Admit: 2022-02-08 | Discharge: 2022-02-13 | DRG: 811 | Disposition: A | Discharge status: Hospice | Payer: Medicare Other | Attending: Internal Medicine | Admitting: Internal Medicine

## 2022-02-08 DIAGNOSIS — S32010G Wedge compression fracture of first lumbar vertebra, subsequent encounter for fracture with delayed healing: Secondary | ICD-10-CM | POA: Diagnosis not present

## 2022-02-08 DIAGNOSIS — Z23 Encounter for immunization: Secondary | ICD-10-CM | POA: Diagnosis not present

## 2022-02-08 DIAGNOSIS — J811 Chronic pulmonary edema: Secondary | ICD-10-CM | POA: Diagnosis not present

## 2022-02-08 DIAGNOSIS — S32010A Wedge compression fracture of first lumbar vertebra, initial encounter for closed fracture: Secondary | ICD-10-CM | POA: Diagnosis not present

## 2022-02-08 DIAGNOSIS — J90 Pleural effusion, not elsewhere classified: Secondary | ICD-10-CM | POA: Diagnosis not present

## 2022-02-08 DIAGNOSIS — G9341 Metabolic encephalopathy: Secondary | ICD-10-CM | POA: Diagnosis present

## 2022-02-08 DIAGNOSIS — I10 Essential (primary) hypertension: Secondary | ICD-10-CM | POA: Diagnosis present

## 2022-02-08 DIAGNOSIS — S0081XA Abrasion of other part of head, initial encounter: Secondary | ICD-10-CM | POA: Diagnosis not present

## 2022-02-08 DIAGNOSIS — S301XXA Contusion of abdominal wall, initial encounter: Secondary | ICD-10-CM | POA: Diagnosis not present

## 2022-02-08 DIAGNOSIS — F0392 Unspecified dementia, unspecified severity, with psychotic disturbance: Secondary | ICD-10-CM | POA: Diagnosis not present

## 2022-02-08 DIAGNOSIS — L89321 Pressure ulcer of left buttock, stage 1: Secondary | ICD-10-CM | POA: Diagnosis present

## 2022-02-08 DIAGNOSIS — E785 Hyperlipidemia, unspecified: Secondary | ICD-10-CM | POA: Diagnosis present

## 2022-02-08 DIAGNOSIS — Z888 Allergy status to other drugs, medicaments and biological substances status: Secondary | ICD-10-CM | POA: Diagnosis not present

## 2022-02-08 DIAGNOSIS — T68XXXA Hypothermia, initial encounter: Secondary | ICD-10-CM

## 2022-02-08 DIAGNOSIS — Z96651 Presence of right artificial knee joint: Secondary | ICD-10-CM | POA: Diagnosis present

## 2022-02-08 DIAGNOSIS — D638 Anemia in other chronic diseases classified elsewhere: Secondary | ICD-10-CM | POA: Diagnosis present

## 2022-02-08 DIAGNOSIS — Z79899 Other long term (current) drug therapy: Secondary | ICD-10-CM

## 2022-02-08 DIAGNOSIS — Z66 Do not resuscitate: Secondary | ICD-10-CM | POA: Diagnosis present

## 2022-02-08 DIAGNOSIS — Z91030 Bee allergy status: Secondary | ICD-10-CM | POA: Diagnosis not present

## 2022-02-08 DIAGNOSIS — S32011A Stable burst fracture of first lumbar vertebra, initial encounter for closed fracture: Secondary | ICD-10-CM | POA: Diagnosis present

## 2022-02-08 DIAGNOSIS — D649 Anemia, unspecified: Secondary | ICD-10-CM | POA: Diagnosis not present

## 2022-02-08 DIAGNOSIS — S32059A Unspecified fracture of fifth lumbar vertebra, initial encounter for closed fracture: Secondary | ICD-10-CM | POA: Diagnosis not present

## 2022-02-08 DIAGNOSIS — L8990 Pressure ulcer of unspecified site, unspecified stage: Secondary | ICD-10-CM | POA: Insufficient documentation

## 2022-02-08 DIAGNOSIS — I700 Atherosclerosis of aorta: Secondary | ICD-10-CM | POA: Diagnosis not present

## 2022-02-08 DIAGNOSIS — E039 Hypothyroidism, unspecified: Secondary | ICD-10-CM | POA: Diagnosis not present

## 2022-02-08 DIAGNOSIS — Z515 Encounter for palliative care: Secondary | ICD-10-CM | POA: Diagnosis not present

## 2022-02-08 DIAGNOSIS — I482 Chronic atrial fibrillation, unspecified: Secondary | ICD-10-CM | POA: Diagnosis not present

## 2022-02-08 DIAGNOSIS — Y92019 Unspecified place in single-family (private) house as the place of occurrence of the external cause: Secondary | ICD-10-CM

## 2022-02-08 DIAGNOSIS — D62 Acute posthemorrhagic anemia: Principal | ICD-10-CM | POA: Diagnosis present

## 2022-02-08 DIAGNOSIS — M47812 Spondylosis without myelopathy or radiculopathy, cervical region: Secondary | ICD-10-CM | POA: Diagnosis not present

## 2022-02-08 DIAGNOSIS — L89311 Pressure ulcer of right buttock, stage 1: Secondary | ICD-10-CM | POA: Diagnosis present

## 2022-02-08 DIAGNOSIS — H5462 Unqualified visual loss, left eye, normal vision right eye: Secondary | ICD-10-CM | POA: Diagnosis present

## 2022-02-08 DIAGNOSIS — R1310 Dysphagia, unspecified: Secondary | ICD-10-CM | POA: Diagnosis present

## 2022-02-08 DIAGNOSIS — N400 Enlarged prostate without lower urinary tract symptoms: Secondary | ICD-10-CM | POA: Diagnosis not present

## 2022-02-08 DIAGNOSIS — R778 Other specified abnormalities of plasma proteins: Secondary | ICD-10-CM | POA: Diagnosis not present

## 2022-02-08 DIAGNOSIS — S32050A Wedge compression fracture of fifth lumbar vertebra, initial encounter for closed fracture: Secondary | ICD-10-CM | POA: Diagnosis not present

## 2022-02-08 DIAGNOSIS — W19XXXD Unspecified fall, subsequent encounter: Secondary | ICD-10-CM | POA: Diagnosis not present

## 2022-02-08 DIAGNOSIS — S00511A Abrasion of lip, initial encounter: Secondary | ICD-10-CM | POA: Diagnosis present

## 2022-02-08 DIAGNOSIS — R338 Other retention of urine: Secondary | ICD-10-CM | POA: Diagnosis not present

## 2022-02-08 DIAGNOSIS — J9811 Atelectasis: Secondary | ICD-10-CM | POA: Diagnosis not present

## 2022-02-08 DIAGNOSIS — W19XXXA Unspecified fall, initial encounter: Secondary | ICD-10-CM | POA: Diagnosis present

## 2022-02-08 DIAGNOSIS — R310 Gross hematuria: Secondary | ICD-10-CM | POA: Diagnosis not present

## 2022-02-08 DIAGNOSIS — Z8249 Family history of ischemic heart disease and other diseases of the circulatory system: Secondary | ICD-10-CM

## 2022-02-08 DIAGNOSIS — I9589 Other hypotension: Secondary | ICD-10-CM | POA: Diagnosis not present

## 2022-02-08 DIAGNOSIS — S22080A Wedge compression fracture of T11-T12 vertebra, initial encounter for closed fracture: Secondary | ICD-10-CM | POA: Diagnosis not present

## 2022-02-08 DIAGNOSIS — I959 Hypotension, unspecified: Secondary | ICD-10-CM | POA: Diagnosis present

## 2022-02-08 DIAGNOSIS — Z7901 Long term (current) use of anticoagulants: Secondary | ICD-10-CM | POA: Diagnosis not present

## 2022-02-08 DIAGNOSIS — S32010K Wedge compression fracture of first lumbar vertebra, subsequent encounter for fracture with nonunion: Secondary | ICD-10-CM | POA: Diagnosis not present

## 2022-02-08 DIAGNOSIS — M549 Dorsalgia, unspecified: Secondary | ICD-10-CM | POA: Diagnosis not present

## 2022-02-08 DIAGNOSIS — Z7401 Bed confinement status: Secondary | ICD-10-CM | POA: Diagnosis not present

## 2022-02-08 DIAGNOSIS — Z91011 Allergy to milk products: Secondary | ICD-10-CM | POA: Diagnosis not present

## 2022-02-08 DIAGNOSIS — D509 Iron deficiency anemia, unspecified: Secondary | ICD-10-CM | POA: Diagnosis present

## 2022-02-08 DIAGNOSIS — S300XXA Contusion of lower back and pelvis, initial encounter: Secondary | ICD-10-CM | POA: Diagnosis present

## 2022-02-08 DIAGNOSIS — Z043 Encounter for examination and observation following other accident: Secondary | ICD-10-CM | POA: Diagnosis not present

## 2022-02-08 DIAGNOSIS — R188 Other ascites: Secondary | ICD-10-CM | POA: Diagnosis not present

## 2022-02-08 DIAGNOSIS — T68XXXD Hypothermia, subsequent encounter: Secondary | ICD-10-CM | POA: Diagnosis not present

## 2022-02-08 DIAGNOSIS — K5730 Diverticulosis of large intestine without perforation or abscess without bleeding: Secondary | ICD-10-CM | POA: Diagnosis not present

## 2022-02-08 DIAGNOSIS — R680 Hypothermia, not associated with low environmental temperature: Secondary | ICD-10-CM | POA: Diagnosis present

## 2022-02-08 DIAGNOSIS — N401 Enlarged prostate with lower urinary tract symptoms: Secondary | ICD-10-CM | POA: Diagnosis present

## 2022-02-08 DIAGNOSIS — I4891 Unspecified atrial fibrillation: Secondary | ICD-10-CM | POA: Diagnosis not present

## 2022-02-08 DIAGNOSIS — S32018A Other fracture of first lumbar vertebra, initial encounter for closed fracture: Secondary | ICD-10-CM | POA: Diagnosis not present

## 2022-02-08 DIAGNOSIS — Z87891 Personal history of nicotine dependence: Secondary | ICD-10-CM

## 2022-02-08 DIAGNOSIS — Z79890 Hormone replacement therapy (postmenopausal): Secondary | ICD-10-CM | POA: Diagnosis not present

## 2022-02-08 DIAGNOSIS — Z96643 Presence of artificial hip joint, bilateral: Secondary | ICD-10-CM | POA: Diagnosis present

## 2022-02-08 DIAGNOSIS — W19XXXS Unspecified fall, sequela: Secondary | ICD-10-CM | POA: Diagnosis not present

## 2022-02-08 DIAGNOSIS — M4312 Spondylolisthesis, cervical region: Secondary | ICD-10-CM | POA: Diagnosis not present

## 2022-02-08 DIAGNOSIS — S32058A Other fracture of fifth lumbar vertebra, initial encounter for closed fracture: Secondary | ICD-10-CM | POA: Diagnosis not present

## 2022-02-08 DIAGNOSIS — R410 Disorientation, unspecified: Secondary | ICD-10-CM | POA: Diagnosis not present

## 2022-02-08 DIAGNOSIS — Z7189 Other specified counseling: Secondary | ICD-10-CM | POA: Diagnosis not present

## 2022-02-08 LAB — I-STAT CHEM 8, ED
BUN: 25 mg/dL — ABNORMAL HIGH (ref 8–23)
Calcium, Ion: 1.24 mmol/L (ref 1.15–1.40)
Chloride: 105 mmol/L (ref 98–111)
Creatinine, Ser: 0.8 mg/dL (ref 0.61–1.24)
Glucose, Bld: 126 mg/dL — ABNORMAL HIGH (ref 70–99)
HCT: 21 % — ABNORMAL LOW (ref 39.0–52.0)
Hemoglobin: 7.1 g/dL — ABNORMAL LOW (ref 13.0–17.0)
Potassium: 4.8 mmol/L (ref 3.5–5.1)
Sodium: 140 mmol/L (ref 135–145)
TCO2: 25 mmol/L (ref 22–32)

## 2022-02-08 LAB — LACTIC ACID, PLASMA
Lactic Acid, Venous: 1.7 mmol/L (ref 0.5–1.9)
Lactic Acid, Venous: 1.9 mmol/L (ref 0.5–1.9)

## 2022-02-08 LAB — COMPREHENSIVE METABOLIC PANEL
ALT: 16 U/L (ref 0–44)
AST: 17 U/L (ref 15–41)
Albumin: 2.7 g/dL — ABNORMAL LOW (ref 3.5–5.0)
Alkaline Phosphatase: 76 U/L (ref 38–126)
Anion gap: 8 (ref 5–15)
BUN: 26 mg/dL — ABNORMAL HIGH (ref 8–23)
CO2: 26 mmol/L (ref 22–32)
Calcium: 8.9 mg/dL (ref 8.9–10.3)
Chloride: 107 mmol/L (ref 98–111)
Creatinine, Ser: 0.93 mg/dL (ref 0.61–1.24)
GFR, Estimated: >60 mL/min (ref >60)
Glucose, Bld: 130 mg/dL — ABNORMAL HIGH (ref 70–99)
Potassium: 4.8 mmol/L (ref 3.5–5.1)
Sodium: 141 mmol/L (ref 135–145)
Total Bilirubin: 1 mg/dL (ref 0.3–1.2)
Total Protein: 4.8 g/dL — ABNORMAL LOW (ref 6.5–8.1)

## 2022-02-08 LAB — CBC WITH DIFFERENTIAL/PLATELET
Abs Immature Granulocytes: 0.01 10*3/uL (ref 0.00–0.07)
Basophils Absolute: 0 10*3/uL (ref 0.0–0.1)
Basophils Relative: 0 %
Eosinophils Absolute: 0 10*3/uL (ref 0.0–0.5)
Eosinophils Relative: 0 %
HCT: 24 % — ABNORMAL LOW (ref 39.0–52.0)
Hemoglobin: 7.6 g/dL — ABNORMAL LOW (ref 13.0–17.0)
Immature Granulocytes: 0 %
Lymphocytes Relative: 9 %
Lymphs Abs: 0.5 10*3/uL — ABNORMAL LOW (ref 0.7–4.0)
MCH: 31 pg (ref 26.0–34.0)
MCHC: 31.7 g/dL (ref 30.0–36.0)
MCV: 98 fL (ref 80.0–100.0)
Monocytes Absolute: 0.6 10*3/uL (ref 0.1–1.0)
Monocytes Relative: 11 %
Neutro Abs: 4.4 10*3/uL (ref 1.7–7.7)
Neutrophils Relative %: 80 %
Platelets: 113 10*3/uL — ABNORMAL LOW (ref 150–400)
RBC: 2.45 MIL/uL — ABNORMAL LOW (ref 4.22–5.81)
RDW: 15.9 % — ABNORMAL HIGH (ref 11.5–15.5)
WBC: 5.6 10*3/uL (ref 4.0–10.5)
nRBC: 0 % (ref 0.0–0.2)

## 2022-02-08 LAB — TROPONIN I (HIGH SENSITIVITY)
Troponin I (High Sensitivity): 105 ng/L (ref <18)
Troponin I (High Sensitivity): 201 ng/L (ref <18)

## 2022-02-08 LAB — POC OCCULT BLOOD, ED: Fecal Occult Bld: NEGATIVE

## 2022-02-08 LAB — PREPARE RBC (CROSSMATCH)

## 2022-02-08 LAB — CK: Total CK: 234 U/L (ref 49–397)

## 2022-02-08 LAB — T4, FREE: Free T4: 1.32 ng/dL — ABNORMAL HIGH (ref 0.61–1.12)

## 2022-02-08 LAB — CORTISOL: Cortisol, Plasma: 21.3 ug/dL

## 2022-02-08 LAB — MRSA NEXT GEN BY PCR, NASAL: MRSA by PCR Next Gen: NOT DETECTED

## 2022-02-08 LAB — TSH: TSH: 9.96 u[IU]/mL — ABNORMAL HIGH (ref 0.350–4.500)

## 2022-02-08 MED ORDER — ORAL CARE MOUTH RINSE
15.0000 mL | OROMUCOSAL | Status: DC
  Administered 2022-02-08 – 2022-02-12 (×12): 15 mL via OROMUCOSAL

## 2022-02-08 MED ORDER — LACTATED RINGERS IV BOLUS
1000.0000 mL | Freq: Once | INTRAVENOUS | Status: AC
  Administered 2022-02-08: 1000 mL via INTRAVENOUS

## 2022-02-08 MED ORDER — SODIUM CHLORIDE 0.9 % IV SOLN: 2.0000 g | Freq: Once | INTRAVENOUS | Status: DC

## 2022-02-08 MED ORDER — SODIUM CHLORIDE 0.9 % IV BOLUS
1000.0000 mL | Freq: Once | INTRAVENOUS | Status: AC
Start: 2022-02-08 — End: 2022-02-08
  Administered 2022-02-08: 1000 mL via INTRAVENOUS

## 2022-02-08 MED ORDER — PANTOPRAZOLE 80MG IVPB - SIMPLE MED
80.0000 mg | Freq: Once | INTRAVENOUS | Status: DC
  Filled 2022-02-08: qty 100

## 2022-02-08 MED ORDER — ORAL CARE MOUTH RINSE: 15.0000 mL | OROMUCOSAL | Status: DC | PRN

## 2022-02-08 MED ORDER — TETANUS-DIPHTH-ACELL PERTUSSIS 5-2.5-18.5 LF-MCG/0.5 IM SUSY
0.5000 mL | PREFILLED_SYRINGE | Freq: Once | INTRAMUSCULAR | Status: AC
  Administered 2022-02-08: 0.5 mL via INTRAMUSCULAR
  Filled 2022-02-08: qty 0.5

## 2022-02-08 MED ORDER — CHLORHEXIDINE GLUCONATE CLOTH 2 % EX PADS
6.0000 | MEDICATED_PAD | Freq: Every day | CUTANEOUS | Status: DC
  Administered 2022-02-08 – 2022-02-11 (×4): 6 via TOPICAL

## 2022-02-08 MED ORDER — VANCOMYCIN HCL 1250 MG/250ML IV SOLN
1250.0000 mg | Freq: Once | INTRAVENOUS | Status: DC
  Filled 2022-02-08: qty 250

## 2022-02-08 MED ORDER — IOHEXOL 300 MG/ML  SOLN
80.0000 mL | Freq: Once | INTRAMUSCULAR | Status: AC | PRN
  Administered 2022-02-08: 80 mL via INTRAVENOUS

## 2022-02-08 MED ORDER — PROTHROMBIN COMPLEX CONC HUMAN 500 UNITS IV KIT
1642.0000 [IU] | PACK | Status: AC
  Administered 2022-02-08: 1642 [IU] via INTRAVENOUS
  Filled 2022-02-08: qty 1068

## 2022-02-08 MED ORDER — SODIUM CHLORIDE 0.9 % IV SOLN
10.0000 mL/h | Freq: Once | INTRAVENOUS | Status: AC
  Administered 2022-02-08: 10 mL/h via INTRAVENOUS

## 2022-02-08 NOTE — Progress Notes (Signed)
[facility] Progress Note Patient Name: Gene Hunter DOB: 11-14-1925 MRN: [id number]   Date of Service  02/08/2022  HPI/Events of Note  Notified by RN that patient was trying to get out of bed, insisting to get up to void.    ON camera assessment, pt is resting calmly.  He has underlying dementia and is difficult to reorient.   eICU Interventions  Posey belt ordered.  Hold off on foley cath placement.      Intervention Category Intermediate Interventions: Change in mental status - evaluation and management  Elsie Lincoln 02/08/2022, 9:08 PM

## 2022-02-08 NOTE — ED Notes (Signed)
1 st unit emergency release blood  [id number] up to infuse

## 2022-02-08 NOTE — ED Provider Notes (Signed)
[facility] EMERGENCY DEPARTMENT Provider Note   CSN: [id number] Arrival date & time: 02/08/22  1338     History  Chief Complaint  Patient presents with   level 2 fall    Gene Hunter is a 88 y.o. male.  88 year old male with prior medical history as detailed below presents for evaluation.  Patient was found by his family on the the floor of the basement at the foot of a flight of stairs.  He was found around noon today.  He was last seen around 2100 last night.  Patient has dementia at baseline.  He is at his normal mental status today.  Per the family, the patient would not have the strength to stand up after a fall regardless of whether he was injured or not.  The patient is comfortable.  He is without any complaint at this time.  He cannot recall the events of the fall.  Patient apparently does take Eliquis.  His last dose of Eliquis was yesterday evening.  EMS noted abrasions and dried blood on his left lower lip.    The history is provided by the patient, medical records and a relative.  Fall This is a new problem. The current episode started yesterday. The problem occurs rarely. The problem has not changed since onset.Pertinent negatives include no chest pain, no abdominal pain, no headaches and no shortness of breath. Nothing aggravates the symptoms. Nothing relieves the symptoms.       Home Medications Prior to Admission medications   Medication Sig Start Date End Date Taking? Authorizing Provider  alfuzosin (UROXATRAL) 10 MG 24 hr tablet Take 10 mg by mouth daily. 09/25/18   [provider]  apixaban (ELIQUIS) 5 MG TABS tablet Take 1 tablet by mouth 2 (two) times daily.    [provider]  atorvastatin (LIPITOR) 20 MG tablet Take 20 mg by mouth daily.    [provider]  Calcium 600-400 MG-UNIT CHEW 1 tablet with meals    [provider]  calcium carbonate (OSCAL) 1500 (600 Ca) MG TABS tablet Take 600 mg of  elemental calcium by mouth daily with breakfast.    [provider]  cholecalciferol (VITAMIN D) 1000 units tablet Take 1,000 Units by mouth daily.    [provider]  docusate sodium (COLACE) 100 MG capsule Take 100 mg by mouth daily.    [provider]  levothyroxine (SYNTHROID) 150 MCG tablet Take 150 mcg by mouth daily. 11/11/20   [provider]  Multiple Vitamins-Minerals (MULTIVITAMIN WITH MINERALS) tablet Take 1 tablet by mouth daily.    [provider]      Allergies    Bee venom, Milk-related compounds, Coricidin d cold-flu-sinus [chlorphen-pe-acetaminophen], and Maxzide [hydrochlorothiazide w-triamterene]    Review of Systems   Review of Systems  Respiratory:  Negative for shortness of breath.   Cardiovascular:  Negative for chest pain.  Gastrointestinal:  Negative for abdominal pain.  Neurological:  Negative for headaches.  All other systems reviewed and are negative.   Physical Exam Updated Vital Signs BP (!) 76/53   Pulse 63   Temp (!) 96.3 F (35.7 C) (Temporal)   Resp 15   SpO2 100%  Physical Exam Vitals and nursing note reviewed.  Constitutional:      General: He is not in acute distress.    Appearance: Normal appearance. He is well-developed.  HENT:     Head: Normocephalic.     Comments: Abrasions to the forehead without active  bleeding, superficial abrasion to the left lower lip. Eyes:     Conjunctiva/sclera: Conjunctivae normal.     Pupils: Pupils are equal, round, and reactive to light.  Cardiovascular:     Rate and Rhythm: Normal rate and regular rhythm.     Heart sounds: Normal heart sounds.  Pulmonary:     Effort: Pulmonary effort is normal. No respiratory distress.     Breath sounds: Normal breath sounds.  Abdominal:     General: Abdomen is flat. There is no distension.     Palpations: Abdomen is soft.     Tenderness: There is no abdominal tenderness.  Musculoskeletal:        General: No deformity.  Normal range of motion.     Cervical back: Normal range of motion and neck supple.     Comments: Possible hematoma over the low back.  Skin:    General: Skin is warm and dry.  Neurological:     General: No focal deficit present.     Mental Status: He is alert and oriented to person, place, and time.     ED Results / Procedures / Treatments   Labs (all labs ordered are listed, but only abnormal results are displayed) Labs Reviewed  CBC WITH DIFFERENTIAL/PLATELET - Abnormal; Notable for the following components:      Result Value   RBC 2.45 (*)    Hemoglobin 7.6 (*)    HCT 24.0 (*)    RDW 15.9 (*)    Platelets 113 (*)    Lymphs Abs 0.5 (*)    All other components within normal limits  COMPREHENSIVE METABOLIC PANEL - Abnormal; Notable for the following components:   Glucose, Bld 130 (*)    BUN 26 (*)    Total Protein 4.8 (*)    Albumin 2.7 (*)    All other components within normal limits  I-STAT CHEM 8, ED - Abnormal; Notable for the following components:   BUN 25 (*)    Glucose, Bld 126 (*)    Hemoglobin 7.1 (*)    HCT 21.0 (*)    All other components within normal limits  TROPONIN I (HIGH SENSITIVITY) - Abnormal; Notable for the following components:   Troponin I (High Sensitivity) 105 (*)    All other components within normal limits  CULTURE, BLOOD (ROUTINE X 2)  CULTURE, BLOOD (ROUTINE X 2)  CK  LACTIC ACID, PLASMA  LACTIC ACID, PLASMA  URINALYSIS, ROUTINE W REFLEX MICROSCOPIC  POC OCCULT BLOOD, ED  TYPE AND SCREEN  PREPARE RBC (CROSSMATCH)    EKG None  Radiology DG Pelvis Portable  Result Date: 02/08/2022 CLINICAL DATA:  Fall.  On blood thinners. EXAM: PORTABLE PELVIS 1-2 VIEWS COMPARISON:  09/22/2010 FINDINGS: Status post bilateral hip arthroplasty. Osteopenia. No acute fracture or dislocation. IMPRESSION: No acute osseous abnormality. Electronically Signed   By: Abigail Miyamoto M.D.   On: 02/08/2022 14:02   DG Chest Port 1 View  Result Date:  02/08/2022 CLINICAL DATA:  Fall, on blood thinners, on initial encounter. EXAM: PORTABLE CHEST 1 VIEW COMPARISON:  10/08/2018. FINDINGS: Trachea is midline. Heart size is enlarged and accentuated by AP portable supine technique. Lungs are clear. Probable left infrahilar atelectasis. No definite pleural fluid or pneumothorax. Osseous structures appear grossly intact but osteopenic. Degenerative changes in the left shoulder. IMPRESSION: Left infrahilar atelectasis. Electronically Signed   By: Lorin Picket M.D.   On: 02/08/2022 14:02    Procedures Procedures    Medications Ordered in ED Medications  0.9 %  sodium chloride infusion (has no administration in time range)  pantoprazole (PROTONIX) 80 mg /NS 100 mL IVPB (has no administration in time range)  Tdap (BOOSTRIX) injection 0.5 mL (0.5 mLs Intramuscular Given 02/08/22 1424)  sodium chloride 0.9 % bolus 1,000 mL (0 mLs Intravenous Stopped 02/08/22 1419)  lactated ringers bolus 1,000 mL (1,000 mLs Intravenous New Bag/Given 02/08/22 1419)    ED Course/ Medical Decision Making/ A&P                           Medical Decision Making Amount and/or Complexity of Data Reviewed Labs: ordered. Radiology: ordered.  Risk Prescription drug management.    Medical Screen Complete  This patient presented to the ED with complaint of fall.  This complaint involves an extensive number of treatment options. The initial differential diagnosis includes, but is not limited to, traumatic injury related to fall, metabolic abnormality, infection, etc.  This presentation is: Acute, Self-Limited, Previously Undiagnosed, Uncertain Prognosis, Complicated, Systemic Symptoms, and Threat to Life/Bodily Function  Patient presents after being found on the floor of his basement.  The patient has dementia.  Per family, the patient has significant deconditioning and would have difficulty standing after any kind of a fall.  Patient was last seen by caretakers around  2100 last night.  He was found today at noon.  It is unclear whether the patient fell down the flight of steps.  The patient cannot recall any of the details of the fall.  Patient with abrasions to the forehead and face.  Patient without evidence of significant traumatic injury on the thorax or extremities.  Patient is noted to be both hypothermic and hypotensive on initial evaluation.    Initial FAST exam to evaluate for intra-abdominal trauma is negative.  Stool guaiac is negative.  Patient without evidence of significant bleeding on scene per report.  Initial i-STAT reveals hemoglobin of 7.1.  No recent hemoglobin is available for comparison.  2 L of fluids given.  Patient's blood pressure shows some improvement.  Additional 2 units of PRBCs ordered.  CT imaging reveals evidence of large hematoma in musculature of the lower back/upper gluteal region.   Critical care made aware of case and will evaluate.   Dr. Grandville Silos covering Trauma service made aware of case and will consult.   Patient's daughter, Conley Canal, is en route to the ED.  She confirms the patient's DNR status.  Critical care will admit.     Co morbidities that complicated the patient's evaluation  Dementia, advanced age, on chronic anticoagulation   Additional history obtained:  Additional history obtained from [facility] External records from outside sources obtained and reviewed including prior ED visits and prior Inpatient records.    Lab Tests:  I ordered and personally interpreted labs.  The pertinent results include: CBC, CMP, i-STAT Chem-8, CK, troponin   Imaging Studies ordered:  I ordered imaging studies including CT head, maxillofacial, cervical spine, chest, abdomen, pelvis, plain films of chest and pelvis I agree with the radiologist interpretation.   Cardiac Monitoring:  The patient was maintained on a cardiac monitor.  I personally viewed and interpreted the cardiac monitor which  showed an underlying rhythm of: NSR   Medicines ordered:  I ordered medication including PRBC transfusion  for acute anemia   Reevaluation of the patient after these medicines showed that the patient:   Problem List / ED Course:  Fall, hypotension, hypothermia, acute anemia   Reevaluation:  After the  interventions noted above, I reevaluated the patient and found that they have: improved   Disposition:  After consideration of the diagnostic results and the patients response to treatment, I feel that the patent would benefit from admission.    CRITICAL CARE Performed by: Valarie Merino   Total critical care time: 45 minutes  Critical care time was exclusive of separately billable procedures and treating other patients.  Critical care was necessary to treat or prevent imminent or life-threatening deterioration.  Critical care was time spent personally by me on the following activities: development of treatment plan with patient and/or surrogate as well as nursing, discussions with consultants, evaluation of patient's response to treatment, examination of patient, obtaining history from patient or surrogate, ordering and performing treatments and interventions, ordering and review of laboratory studies, ordering and review of radiographic studies, pulse oximetry and re-evaluation of patient's condition.         Final Clinical Impression(s) / ED Diagnoses Final diagnoses:  Fall, initial encounter  Anemia, unspecified type    Rx / DC Orders ED Discharge Orders     None         Valarie Merino, MD 02/08/22 [phone]

## 2022-02-08 NOTE — Consult Note (Addendum)
Gene Hunter [birth year]  [id number].    Requesting MD: Dr. Dene Gentry Chief Complaint/Reason for Consult: Fall  HPI: Gene Hunter is a 88 y.o. male with a history of A-fib on Eliquis, hypertension, hyperlipidemia, hypothyroidism, dementia who presented after a fall.  Family is at bedside and helps provide history as patient is very hard a history and has underlying history of dementia.  Additional history was provided from chart review.  Patient was last seen by family late last night.  They found him around noon today in his basement several feet away from the bottom of a flight of stairs.  It is unclear if he fell down the stairs but he does have some abrasions on his forehead + left posterior shoulder as well as a large hematoma on his lower back.  He is at mental baseline and moving all extremities. He has no complaints presently. On arrival patient with hypotension with pressures in the 70s but no tachycardic.  He was hypothermic with improvement after bear hugger. CTH and CT C-spine negative for acute traumatic injury. CT CAP w/ L5 compression fracture and L1 incomplete burst fracture with 45% height loss and 2 mm retropulsion into the central canal.  He additionally was found to have a 20 x 4.5 x 20 cm lower back soft tissue hematoma without mention of active extravasation.  Hemoglobin 7.1 and platelets 113.  He was given 2 units of PRBC and pharmacy is dosing reversal for Eliquis. BP improved to 99/63 on last check. CCM was called for admission.  We were asked to see for consultation.   ROS: Review of Systems  Unable to perform ROS: Dementia    Family History  Problem Relation Age of Onset   Heart disease Father    Heart attack Father        in his 61's to 69's   Heart attack Mother        in her 38's    Past Medical History:  Diagnosis Date   Blindness of right eye    BPH (benign prostatic hyperplasia)    Essential hypertension    HLD (hyperlipidemia)     Hypothyroidism     Past Surgical History:  Procedure Laterality Date   bilateral hip replacement     ESOPHAGEAL MANOMETRY N/A 11/21/2017   Procedure: ESOPHAGEAL MANOMETRY (EM);  Surgeon: Carol Ada, MD;  Location: [facility];  Service: Endoscopy;  Laterality: N/A;   HERNIA REPAIR     JOINT REPLACEMENT     right knee replacement      Social History:  reports that he has quit smoking. He has never used smokeless tobacco. He reports that he does not drink alcohol and does not use drugs.  Allergies:  Allergies  Allergen Reactions   Bee Venom Swelling and Other (See Comments)    Reaction:  All over body swelling    Milk-Related Compounds Rash   Coricidin D Cold-Flu-Sinus [Chlorphen-Pe-Acetaminophen]    Maxzide [Hydrochlorothiazide W-Triamterene] Palpitations    (Not in a hospital admission)    Physical Exam: Blood pressure 104/64, pulse 77, temperature (!) 96.3 F (35.7 C), resp. rate 13, weight 67.2 kg, SpO2 100 %. General: pleasant, elderly white male who is laying flat on trauma stretcher in NAD HEENT: R forehead abrasions.  Left eye blindness. R sclera are noninjected with PERRL.  Ears and nose without any masses or lesions.  Mouth is pink and moist. Dentition fair Neck: CT C-spine reviewed and negative.  C-spine cleared.  Trachea midline.  No stridor. Heart: Irregular rhythm with regular rate.  Palpable radial and pedal pulses bilaterally  Lungs: CTAB, no wheezes, rhonchi, or rales noted.  Respiratory effort nonlabored Abd:  Soft, scaphoid, ND, NT, +BS MS: MAE's.  No reported tenderness to bilateral upper extremities and bilateral lower extremities.  No gross deformities of major joints. Skin: Right forehead and left posterior shoulder abrasions.  Otherwise warm and dry with no masses, lesions, or rashes Psych: A&Ox2 (self and situation. Thinks he is in [place] and the year is 2020) with an appropriate affect Neuro: cranial nerves grossly intact besides baseline L eye  blindness, doesn't participate well with strength testing but MAE's with gross similar motor function, SILT to BUE and BLE's reported to be equal, normal speech, thought process intact, gait not assessed   Results for orders placed or performed during the hospital encounter of 02/08/22 (from the past 48 hour(s))  Type and screen [place]     Status: None (Preliminary result)   Collection Time: 02/08/22  1:45 PM  Result Value Ref Range   ABO/RH(D) A POS    Antibody Screen NEG    Sample Expiration 02/11/2022,2359    Unit Number [id number]    Blood Component Type RED CELLS,LR    Unit division 00    Status of Unit ISSUED    Unit tag comment EMERGENCY RELEASE    Transfusion Status OK TO TRANSFUSE    Crossmatch Result COMPATIBLE    Unit Number [id number]    Blood Component Type RED CELLS,LR    Unit division 00    Status of Unit ISSUED    Transfusion Status OK TO TRANSFUSE    Crossmatch Result      Compatible Performed at [facility], [address] [place], [place] [address]   CBC with Differential     Status: Abnormal   Collection Time: 02/08/22  1:52 PM  Result Value Ref Range   WBC 5.6 4.0 - 10.5 K/uL   RBC 2.45 (L) 4.22 - 5.81 MIL/uL   Hemoglobin 7.6 (L) 13.0 - 17.0 g/dL   HCT 24.0 (L) 39.0 - 52.0 %   MCV 98.0 80.0 - 100.0 fL   MCH 31.0 26.0 - 34.0 pg   MCHC 31.7 30.0 - 36.0 g/dL   RDW 15.9 (H) 11.5 - 15.5 %   Platelets 113 (L) 150 - 400 K/uL   nRBC 0.0 0.0 - 0.2 %   Neutrophils Relative % 80 %   Neutro Abs 4.4 1.7 - 7.7 K/uL   Lymphocytes Relative 9 %   Lymphs Abs 0.5 (L) 0.7 - 4.0 K/uL   Monocytes Relative 11 %   Monocytes Absolute 0.6 0.1 - 1.0 K/uL   Eosinophils Relative 0 %   Eosinophils Absolute 0.0 0.0 - 0.5 K/uL   Basophils Relative 0 %   Basophils Absolute 0.0 0.0 - 0.1 K/uL   Immature Granulocytes 0 %   Abs Immature Granulocytes 0.01 0.00 - 0.07 K/uL    Comment: Performed at [facility], [place] [address],  [place], [place] [address]  Troponin I (High Sensitivity)     Status: Abnormal   Collection Time: 02/08/22  1:52 PM  Result Value Ref Range   Troponin I (High Sensitivity) 105 (HH) <18 ng/L    Comment: CRITICAL RESULT CALLED TO, READ BACK BY AND VERIFIED WITH: Revonda Humphrey, RN 1442 02/08/22 L. KLAR (NOTE) Elevated high sensitivity troponin I (hsTnI) values and significant  changes across serial  measurements may suggest ACS but many other  chronic and acute conditions are known to elevate hsTnI results.  Refer to the Links section for chest pain algorithms and additional  guidance. Performed at [facility], [place] [address], [place], [place] [address]   Comprehensive metabolic panel     Status: Abnormal   Collection Time: 02/08/22  1:52 PM  Result Value Ref Range   Sodium 141 135 - 145 mmol/L   Potassium 4.8 3.5 - 5.1 mmol/L   Chloride 107 98 - 111 mmol/L   CO2 26 22 - 32 mmol/L   Glucose, Bld 130 (H) 70 - 99 mg/dL    Comment: Glucose reference range applies only to samples taken after fasting for at least 8 hours.   BUN 26 (H) 8 - 23 mg/dL   Creatinine, Ser 0.93 0.61 - 1.24 mg/dL   Calcium 8.9 8.9 - 10.3 mg/dL   Total Protein 4.8 (L) 6.5 - 8.1 g/dL   Albumin 2.7 (L) 3.5 - 5.0 g/dL   AST 17 15 - 41 U/L   ALT 16 0 - 44 U/L   Alkaline Phosphatase 76 38 - 126 U/L   Total Bilirubin 1.0 0.3 - 1.2 mg/dL   GFR, Estimated >60 >60 mL/min    Comment: (NOTE) Calculated using the CKD-EPI Creatinine Equation (2021)    Anion gap 8 5 - 15    Comment: Performed at [facility], [place] [address] [place], [place] [address]  CK     Status: None   Collection Time: 02/08/22  1:52 PM  Result Value Ref Range   Total CK 234 49 - 397 U/L    Comment: Performed at [facility], [place] [address] [place], [place] [address]  Lactic acid, plasma     Status: None   Collection Time: 02/08/22  1:52 PM  Result Value Ref Range   Lactic Acid, Venous 1.7 0.5 - 1.9 mmol/L    Comment: Performed at [facility] [address] [place], [place] [address]  I-stat chem 8, ED     Status: Abnormal   Collection Time: 02/08/22  2:01 PM  Result Value Ref Range   Sodium 140 135 - 145 mmol/L   Potassium 4.8 3.5 - 5.1 mmol/L   Chloride 105 98 - 111 mmol/L   BUN 25 (H) 8 - 23 mg/dL   Creatinine, Ser 0.80 0.61 - 1.24 mg/dL   Glucose, Bld 126 (H) 70 - 99 mg/dL    Comment: Glucose reference range applies only to samples taken after fasting for at least 8 hours.   Calcium, Ion 1.24 1.15 - 1.40 mmol/L   TCO2 25 22 - 32 mmol/L   Hemoglobin 7.1 (L) 13.0 - 17.0 g/dL   HCT 21.0 (L) 39.0 - 52.0 %  POC occult blood, ED     Status: None   Collection Time: 02/08/22  2:31 PM  Result Value Ref Range   Fecal Occult Bld NEGATIVE NEGATIVE  Prepare RBC (crossmatch)     Status: None   Collection Time: 02/08/22  3:00 PM  Result Value Ref Range   Order Confirmation      ORDER PROCESSED BY BLOOD BANK Performed at [facility], [place] [address], [place], [place] [address]    CT CHEST ABDOMEN PELVIS W CONTRAST  Result Date: 02/08/2022 CLINICAL DATA:  Chest trauma, blunt. Fall. pt found at the bottom of the stair case EXAM: CT CHEST, ABDOMEN, AND PELVIS WITH CONTRAST  TECHNIQUE: Multidetector CT imaging of the chest, abdomen and pelvis was performed following the standard protocol during bolus administration of intravenous contrast. RADIATION DOSE REDUCTION: This exam was performed according to the departmental dose-optimization program which includes automated exposure control, adjustment of the mA and/or kV according to patient size and/or use of iterative reconstruction technique. CONTRAST:  80m OMNIPAQUE IOHEXOL 300 MG/ML  SOLN COMPARISON:  CT hip left 05/05/2017 FINDINGS: CHEST: Ports and Devices: None. Lungs/airways: Mild bilateral upper lobe interlobular septal wall thickening. No focal consolidation. No pulmonary nodule. No pulmonary mass. No pulmonary contusion or laceration. No pneumatocele formation. The  central airways are patent. Pleura: Bilateral trace to small volume pleural effusions. No pneumothorax. No hemothorax. Lymph Nodes: No mediastinal, hilar, or axillary lymphadenopathy. Mediastinum: No pneumomediastinum. No aortic injury or mediastinal hematoma. The thoracic aorta is normal in caliber. Moderate to severe atherosclerotic plaque with aortic valve leaflet calcification and four-vessel coronary calcification. The heart is normal in size. No significant pericardial effusion. The esophagus is unremarkable. The thyroid is unremarkable. Chest Wall / Breasts: No chest wall mass. Musculoskeletal: No acute rib or sternal fracture. Grossly stable chronic T12 compression fracture with at least 35 % vertebral body height loss. Bilateral moderate severe shoulder degenerative changes, left greater than right. ABDOMEN / PELVIS: Liver: Not enlarged. No focal lesion. No laceration or subcapsular hematoma. Biliary System: The gallbladder is otherwise unremarkable with no radio-opaque gallstones. No biliary ductal dilatation. Pancreas: Normal pancreatic contour. No main pancreatic duct dilatation. Spleen: Not enlarged. No focal lesion. No laceration, subcapsular hematoma, or vascular injury. Adrenal Glands: No nodularity bilaterally. Kidneys: Bilateral kidneys enhance symmetrically. No hydronephrosis. No contusion, laceration, or subcapsular hematoma. No injury to the vascular structures or collecting systems. No hydroureter. The urinary bladder is unremarkable. Bowel: No small or large bowel wall thickening or dilatation. Colonic diverticulosis. The appendix is not definitely identified with no inflammatory changes in the right lower quadrant to suggest acute appendicitis. Mesentery, Omentum, and Peritoneum: No simple free fluid ascites. No pneumoperitoneum. No hemoperitoneum. No mesenteric hematoma identified. No organized fluid collection. Pelvic Organs: Normal. Lymph Nodes: No abdominal, pelvic, inguinal  lymphadenopathy. Vasculature: Severe atherosclerotic plaque. No abdominal aorta or iliac aneurysm. No active contrast extravasation or pseudoaneurysm. Musculoskeletal: A 20 x 4.5 x 20 cm bilateral lower back soft tissue hematoma formation. Associated diffuse back subcutaneus soft tissue edema. No definite acute pelvic fracture. Bilateral total hip arthroplasty. Lucency surrounding the left femoral shaft surgical hardware is a chronic finding. Acute L5 compression fracture with at least 35% vertebral body height loss. Acute L1 burst fracture with at least 45% vertebral body height loss and 2 mm retropulsion into the central canal. IMPRESSION: 1. A 20 x 4.5 x 20 cm bilateral lower back soft tissue hematoma formation. 2. Acute L5 compression and L1 complete burst fracture. L1 vertebral body with at least 45% vertebral body height loss and 2 mm retropulsion into the central canal. 3. No acute traumatic injury to the chest, abdomen, or pelvis. 4. Bilateral total hip arthroplasty. Lucency surrounding the left femoral shaft surgical hardware is a chronic finding with loosening or infection not excluded. Other imaging findings of potential clinical significance: 1. Mild pulmonary edema with bilateral trace to small volume pleural effusions. 2. Chronic stable T12 compression fracture. 3. Aortic Atherosclerosis (ICD10-I70.0) including four-vessel coronary calcification and aortic valve leaflet calcifications-correlate for aortic stenosis. Electronically Signed   By: MIven FinnM.D.   On: 02/08/2022 15:47   CT Head Wo Contrast  Result Date: 02/08/2022 CLINICAL  DATA:  Fall down stairs.  Chronic anticoagulation. EXAM: CT HEAD WITHOUT CONTRAST CT MAXILLOFACIAL WITHOUT CONTRAST CT CERVICAL SPINE WITHOUT CONTRAST TECHNIQUE: Multidetector CT imaging of the head, cervical spine, and maxillofacial structures were performed using the standard protocol without intravenous contrast. Multiplanar CT image reconstructions of the  cervical spine and maxillofacial structures were also generated. RADIATION DOSE REDUCTION: This exam was performed according to the departmental dose-optimization program which includes automated exposure control, adjustment of the mA and/or kV according to patient size and/or use of iterative reconstruction technique. COMPARISON:  None Available. FINDINGS: CT HEAD FINDINGS Brain: There is no evidence for acute hemorrhage, hydrocephalus, mass lesion, or abnormal extra-axial fluid collection. No definite CT evidence for acute infarction. Diffuse loss of parenchymal volume is consistent with atrophy. Patchy low attenuation in the deep hemispheric and periventricular white matter is nonspecific, but likely reflects chronic microvascular ischemic demyelination. Vascular: No hyperdense vessel or unexpected calcification. Skull: No evidence for fracture. No worrisome lytic or sclerotic lesion. Other: None. CT MAXILLOFACIAL FINDINGS Osseous: No fracture or mandibular dislocation. No destructive process. Orbits: Negative. No traumatic or inflammatory finding. Sinuses: Mild chronic mucosal thickening left maxillary sinus in scattered ethmoid air cells. No findings to suggest acute sinusitis. Soft tissues: Negative. CT CERVICAL SPINE FINDINGS Alignment: Degenerative retrolisthesis of C4 on 5 noted. Skull base and vertebrae: No acute fracture. No primary bone lesion or focal pathologic process. Soft tissues and spinal canal: No prevertebral fluid or swelling. No visible canal hematoma. Disc levels: Loss of disc height noted C4-5 and C5-6. Mild loss of disc height noted C6-7. Right-sided C5-6 facets are fused. Upper chest: See report for chest CT performed at the same time and dictated separately. Other: None. IMPRESSION: 1. No acute intracranial abnormality. 2. Atrophy with chronic small vessel ischemic disease. 3. No evidence for facial bone fracture. 4. No evidence for acute cervical spine fracture or subluxation.  Degenerative changes in the cervical spine as above. Electronically Signed   By: Misty Stanley M.D.   On: 02/08/2022 15:41   CT Cervical Spine Wo Contrast  Result Date: 02/08/2022 CLINICAL DATA:  Fall down stairs.  Chronic anticoagulation. EXAM: CT HEAD WITHOUT CONTRAST CT MAXILLOFACIAL WITHOUT CONTRAST CT CERVICAL SPINE WITHOUT CONTRAST TECHNIQUE: Multidetector CT imaging of the head, cervical spine, and maxillofacial structures were performed using the standard protocol without intravenous contrast. Multiplanar CT image reconstructions of the cervical spine and maxillofacial structures were also generated. RADIATION DOSE REDUCTION: This exam was performed according to the departmental dose-optimization program which includes automated exposure control, adjustment of the mA and/or kV according to patient size and/or use of iterative reconstruction technique. COMPARISON:  None Available. FINDINGS: CT HEAD FINDINGS Brain: There is no evidence for acute hemorrhage, hydrocephalus, mass lesion, or abnormal extra-axial fluid collection. No definite CT evidence for acute infarction. Diffuse loss of parenchymal volume is consistent with atrophy. Patchy low attenuation in the deep hemispheric and periventricular white matter is nonspecific, but likely reflects chronic microvascular ischemic demyelination. Vascular: No hyperdense vessel or unexpected calcification. Skull: No evidence for fracture. No worrisome lytic or sclerotic lesion. Other: None. CT MAXILLOFACIAL FINDINGS Osseous: No fracture or mandibular dislocation. No destructive process. Orbits: Negative. No traumatic or inflammatory finding. Sinuses: Mild chronic mucosal thickening left maxillary sinus in scattered ethmoid air cells. No findings to suggest acute sinusitis. Soft tissues: Negative. CT CERVICAL SPINE FINDINGS Alignment: Degenerative retrolisthesis of C4 on 5 noted. Skull base and vertebrae: No acute fracture. No primary bone lesion or focal  pathologic process. Soft  tissues and spinal canal: No prevertebral fluid or swelling. No visible canal hematoma. Disc levels: Loss of disc height noted C4-5 and C5-6. Mild loss of disc height noted C6-7. Right-sided C5-6 facets are fused. Upper chest: See report for chest CT performed at the same time and dictated separately. Other: None. IMPRESSION: 1. No acute intracranial abnormality. 2. Atrophy with chronic small vessel ischemic disease. 3. No evidence for facial bone fracture. 4. No evidence for acute cervical spine fracture or subluxation. Degenerative changes in the cervical spine as above. Electronically Signed   By: Misty Stanley M.D.   On: 02/08/2022 15:41   CT Maxillofacial WO CM  Result Date: 02/08/2022 CLINICAL DATA:  Fall down stairs.  Chronic anticoagulation. EXAM: CT HEAD WITHOUT CONTRAST CT MAXILLOFACIAL WITHOUT CONTRAST CT CERVICAL SPINE WITHOUT CONTRAST TECHNIQUE: Multidetector CT imaging of the head, cervical spine, and maxillofacial structures were performed using the standard protocol without intravenous contrast. Multiplanar CT image reconstructions of the cervical spine and maxillofacial structures were also generated. RADIATION DOSE REDUCTION: This exam was performed according to the departmental dose-optimization program which includes automated exposure control, adjustment of the mA and/or kV according to patient size and/or use of iterative reconstruction technique. COMPARISON:  None Available. FINDINGS: CT HEAD FINDINGS Brain: There is no evidence for acute hemorrhage, hydrocephalus, mass lesion, or abnormal extra-axial fluid collection. No definite CT evidence for acute infarction. Diffuse loss of parenchymal volume is consistent with atrophy. Patchy low attenuation in the deep hemispheric and periventricular white matter is nonspecific, but likely reflects chronic microvascular ischemic demyelination. Vascular: No hyperdense vessel or unexpected calcification. Skull: No evidence for  fracture. No worrisome lytic or sclerotic lesion. Other: None. CT MAXILLOFACIAL FINDINGS Osseous: No fracture or mandibular dislocation. No destructive process. Orbits: Negative. No traumatic or inflammatory finding. Sinuses: Mild chronic mucosal thickening left maxillary sinus in scattered ethmoid air cells. No findings to suggest acute sinusitis. Soft tissues: Negative. CT CERVICAL SPINE FINDINGS Alignment: Degenerative retrolisthesis of C4 on 5 noted. Skull base and vertebrae: No acute fracture. No primary bone lesion or focal pathologic process. Soft tissues and spinal canal: No prevertebral fluid or swelling. No visible canal hematoma. Disc levels: Loss of disc height noted C4-5 and C5-6. Mild loss of disc height noted C6-7. Right-sided C5-6 facets are fused. Upper chest: See report for chest CT performed at the same time and dictated separately. Other: None. IMPRESSION: 1. No acute intracranial abnormality. 2. Atrophy with chronic small vessel ischemic disease. 3. No evidence for facial bone fracture. 4. No evidence for acute cervical spine fracture or subluxation. Degenerative changes in the cervical spine as above. Electronically Signed   By: Misty Stanley M.D.   On: 02/08/2022 15:41   DG Pelvis Portable  Result Date: 02/08/2022 CLINICAL DATA:  Fall.  On blood thinners. EXAM: PORTABLE PELVIS 1-2 VIEWS COMPARISON:  09/22/2010 FINDINGS: Status post bilateral hip arthroplasty. Osteopenia. No acute fracture or dislocation. IMPRESSION: No acute osseous abnormality. Electronically Signed   By: Abigail Miyamoto M.D.   On: 02/08/2022 14:02   DG Chest Port 1 View  Result Date: 02/08/2022 CLINICAL DATA:  Fall, on blood thinners, on initial encounter. EXAM: PORTABLE CHEST 1 VIEW COMPARISON:  10/08/2018. FINDINGS: Trachea is midline. Heart size is enlarged and accentuated by AP portable supine technique. Lungs are clear. Probable left infrahilar atelectasis. No definite pleural fluid or pneumothorax. Osseous  structures appear grossly intact but osteopenic. Degenerative changes in the left shoulder. IMPRESSION: Left infrahilar atelectasis. Electronically Signed   By: Rip Harbour  Blietz M.D.   On: 02/08/2022 14:02    Anti-infectives (From admission, onward)    Start     Dose/Rate Route Frequency Ordered Stop   02/08/22 1500  ceFEPIme (MAXIPIME) 2 g in sodium chloride 0.9 % 100 mL IVPB        2 g 200 mL/hr over 30 Minutes Intravenous  Once 02/08/22 1458     02/08/22 1500  vancomycin (VANCOREADY) IVPB 1250 mg/250 mL        1,250 mg 166.7 mL/hr over 90 Minutes Intravenous  Once 02/08/22 1458         Assessment/Plan Fall on thinners Lower back hematoma - no obvious active extravasation on CT. No surgical intervention indicated at this time. Reverse Eliquis, PRBC's, trend hgb. Abdominal binder. ABL anemia - Secondary to above. Plan as above L5 compression fracture, L1 burst fx - NSGY consult, keep flat and logroll until evaluated C-Spine - cleared - Per CCM -  A-fib on Eliquis -hold, reverse L eye blindness HTN HLD Hypothyroidism DNR FEN - NPO for nsgy consult, IVF per CCM VTE - SCDs, hold chemical ppx 2/2 abl anemia ID - None indicated from our standpoint F[place] - Per primary  Dispo - Admit to CCM, we will follow. NSGY consult.   Jillyn Ledger, [facility] Surgery 02/08/2022, 4:04 PM Please see Amion for pager number during day hours 7:00am-4:30pm

## 2022-02-08 NOTE — Consult Note (Signed)
Reason for Consult: L1 and L5 compression fractures Referring Physician: Dr. Renae Fickle is an 88 y.o. male.   HPI:  88 year old male presented to the ED after sustaining a fall at home down a flight of stairs. His family found him around noon today. He has a history of dementia, BPH, HTN. Denies any headaches at the time. He is moving all extremities well with no reports of weakness. Daughter is at bedside and is the historian. He is on eliquis.  Past Medical History:  Diagnosis Date   Blindness of right eye    BPH (benign prostatic hyperplasia)    Essential hypertension    HLD (hyperlipidemia)    Hypothyroidism     Past Surgical History:  Procedure Laterality Date   bilateral hip replacement     ESOPHAGEAL MANOMETRY N/A 11/21/2017   Procedure: ESOPHAGEAL MANOMETRY (EM);  Surgeon: Carol Ada, MD;  Location: [facility];  Service: Endoscopy;  Laterality: N/A;   HERNIA REPAIR     JOINT REPLACEMENT     right knee replacement      Allergies  Allergen Reactions   Bee Venom Swelling and Other (See Comments)    All-over body swelling    Milk-Related Compounds Rash   Other Other (See Comments)    SKIN IS VERY THIN!!! TEARS AND BRUISES VERY EASILY!!!   Coricidin D Cold-Flu-Sinus [Chlorphen-Pe-Acetaminophen]    Coricidin Hbp Congestion-Cough [Dextromethorphan-Guaifenesin] Other (See Comments)    Reaction not noted   Milk [Lac Bovis]     Other reaction(s): Unknown   Hydrochlorothiazide W-Triamterene Palpitations    Other reaction(s): palpatations    Social History   Tobacco Use   Smoking status: Former   Smokeless tobacco: Never  Substance Use Topics   Alcohol use: No    Family History  Problem Relation Age of Onset   Heart disease Father    Heart attack Father        in his 56's to 71's   Heart attack Mother        in her 46's     Review of Systems  Positive ROS: as above  All other systems have been reviewed and were otherwise negative with the  exception of those mentioned in the HPI and as above.  Objective: Vital signs in last 24 hours: Temp:  [89.9 F (32.2 C)-96.3 F (35.7 C)] 96 F (35.6 C) (07/03 1621) Pulse Rate:  [55-77] 59 (07/03 1645) Resp:  [10-19] 10 (07/03 1645) BP: (72-119)/(47-68) 119/63 (07/03 1645) SpO2:  [100 %] 100 % (07/03 1645) Weight:  [67.2 kg] 67.2 kg (07/03 1600)  General Appearance: Alert, cooperative, no distress, appears stated age Head: Normocephalic, without obvious abnormality, atraumatic Eyes: PERRL, conjunctiva/corneas clear, EOM's intact, fundi benign, both eyes      Lungs: respirations unlabored Heart: Regular rate and rhythm Extremities: Extremities normal, atraumatic, no cyanosis or edema Pulses: 2+ and symmetric all extremities Skin: Skin color, texture, turgor normal, no rashes or lesions  NEUROLOGIC:   Mental status: A&O x4, no aphasia, good attention span, Memory and fund of knowledge Motor Exam - grossly normal, normal tone and bulk Sensory Exam - grossly normal Reflexes: symmetric, no pathologic reflexes, No Hoffman's, No clonus Coordination -not tested Gait - not tested Balance - not tested Cranial Nerves: I: smell Not tested  II: visual acuity  OS: na    OD: na  II: visual fields Full to confrontation  II: pupils Equal, round, reactive to light  III,VII: ptosis None  III,IV,VI: extraocular muscles  Full ROM  V: mastication Normal  V: facial light touch sensation  Normal  V,VII: corneal reflex  Present  VII: facial muscle function - upper  Normal  VII: facial muscle function - lower Normal  VIII: hearing Not tested  IX: soft palate elevation  Normal  IX,X: gag reflex Present  XI: trapezius strength  5/5  XI: sternocleidomastoid strength 5/5  XI: neck flexion strength  5/5  XII: tongue strength  Normal    Data Review Lab Results  Component Value Date   WBC 5.6 02/08/2022   HGB 7.1 (L) 02/08/2022   HCT 21.0 (L) 02/08/2022   MCV 98.0 02/08/2022   PLT 113  (L) 02/08/2022   Lab Results  Component Value Date   NA 140 02/08/2022   K 4.8 02/08/2022   CL 105 02/08/2022   CO2 26 02/08/2022   BUN 25 (H) 02/08/2022   CREATININE 0.80 02/08/2022   GLUCOSE 126 (H) 02/08/2022   Lab Results  Component Value Date   INR 1.0 10/08/2018    Radiology: CT CHEST ABDOMEN PELVIS W CONTRAST  Result Date: 02/08/2022 CLINICAL DATA:  Chest trauma, blunt. Fall. pt found at the bottom of the stair case EXAM: CT CHEST, ABDOMEN, AND PELVIS WITH CONTRAST TECHNIQUE: Multidetector CT imaging of the chest, abdomen and pelvis was performed following the standard protocol during bolus administration of intravenous contrast. RADIATION DOSE REDUCTION: This exam was performed according to the departmental dose-optimization program which includes automated exposure control, adjustment of the mA and/or kV according to patient size and/or use of iterative reconstruction technique. CONTRAST:  50m OMNIPAQUE IOHEXOL 300 MG/ML  SOLN COMPARISON:  CT hip left 05/05/2017 FINDINGS: CHEST: Ports and Devices: None. Lungs/airways: Mild bilateral upper lobe interlobular septal wall thickening. No focal consolidation. No pulmonary nodule. No pulmonary mass. No pulmonary contusion or laceration. No pneumatocele formation. The central airways are patent. Pleura: Bilateral trace to small volume pleural effusions. No pneumothorax. No hemothorax. Lymph Nodes: No mediastinal, hilar, or axillary lymphadenopathy. Mediastinum: No pneumomediastinum. No aortic injury or mediastinal hematoma. The thoracic aorta is normal in caliber. Moderate to severe atherosclerotic plaque with aortic valve leaflet calcification and four-vessel coronary calcification. The heart is normal in size. No significant pericardial effusion. The esophagus is unremarkable. The thyroid is unremarkable. Chest Wall / Breasts: No chest wall mass. Musculoskeletal: No acute rib or sternal fracture. Grossly stable chronic T12 compression fracture  with at least 35 % vertebral body height loss. Bilateral moderate severe shoulder degenerative changes, left greater than right. ABDOMEN / PELVIS: Liver: Not enlarged. No focal lesion. No laceration or subcapsular hematoma. Biliary System: The gallbladder is otherwise unremarkable with no radio-opaque gallstones. No biliary ductal dilatation. Pancreas: Normal pancreatic contour. No main pancreatic duct dilatation. Spleen: Not enlarged. No focal lesion. No laceration, subcapsular hematoma, or vascular injury. Adrenal Glands: No nodularity bilaterally. Kidneys: Bilateral kidneys enhance symmetrically. No hydronephrosis. No contusion, laceration, or subcapsular hematoma. No injury to the vascular structures or collecting systems. No hydroureter. The urinary bladder is unremarkable. Bowel: No small or large bowel wall thickening or dilatation. Colonic diverticulosis. The appendix is not definitely identified with no inflammatory changes in the right lower quadrant to suggest acute appendicitis. Mesentery, Omentum, and Peritoneum: No simple free fluid ascites. No pneumoperitoneum. No hemoperitoneum. No mesenteric hematoma identified. No organized fluid collection. Pelvic Organs: Normal. Lymph Nodes: No abdominal, pelvic, inguinal lymphadenopathy. Vasculature: Severe atherosclerotic plaque. No abdominal aorta or iliac aneurysm. No active contrast extravasation or pseudoaneurysm. Musculoskeletal: A  20 x 4.5 x 20 cm bilateral lower back soft tissue hematoma formation. Associated diffuse back subcutaneus soft tissue edema. No definite acute pelvic fracture. Bilateral total hip arthroplasty. Lucency surrounding the left femoral shaft surgical hardware is a chronic finding. Acute L5 compression fracture with at least 35% vertebral body height loss. Acute L1 burst fracture with at least 45% vertebral body height loss and 2 mm retropulsion into the central canal. IMPRESSION: 1. A 20 x 4.5 x 20 cm bilateral lower back soft tissue  hematoma formation. 2. Acute L5 compression and L1 complete burst fracture. L1 vertebral body with at least 45% vertebral body height loss and 2 mm retropulsion into the central canal. 3. No acute traumatic injury to the chest, abdomen, or pelvis. 4. Bilateral total hip arthroplasty. Lucency surrounding the left femoral shaft surgical hardware is a chronic finding with loosening or infection not excluded. Other imaging findings of potential clinical significance: 1. Mild pulmonary edema with bilateral trace to small volume pleural effusions. 2. Chronic stable T12 compression fracture. 3. Aortic Atherosclerosis (ICD10-I70.0) including four-vessel coronary calcification and aortic valve leaflet calcifications-correlate for aortic stenosis. Electronically Signed   By: Iven Finn M.D.   On: 02/08/2022 15:47   CT Head Wo Contrast  Result Date: 02/08/2022 CLINICAL DATA:  Fall down stairs.  Chronic anticoagulation. EXAM: CT HEAD WITHOUT CONTRAST CT MAXILLOFACIAL WITHOUT CONTRAST CT CERVICAL SPINE WITHOUT CONTRAST TECHNIQUE: Multidetector CT imaging of the head, cervical spine, and maxillofacial structures were performed using the standard protocol without intravenous contrast. Multiplanar CT image reconstructions of the cervical spine and maxillofacial structures were also generated. RADIATION DOSE REDUCTION: This exam was performed according to the departmental dose-optimization program which includes automated exposure control, adjustment of the mA and/or kV according to patient size and/or use of iterative reconstruction technique. COMPARISON:  None Available. FINDINGS: CT HEAD FINDINGS Brain: There is no evidence for acute hemorrhage, hydrocephalus, mass lesion, or abnormal extra-axial fluid collection. No definite CT evidence for acute infarction. Diffuse loss of parenchymal volume is consistent with atrophy. Patchy low attenuation in the deep hemispheric and periventricular white matter is nonspecific, but  likely reflects chronic microvascular ischemic demyelination. Vascular: No hyperdense vessel or unexpected calcification. Skull: No evidence for fracture. No worrisome lytic or sclerotic lesion. Other: None. CT MAXILLOFACIAL FINDINGS Osseous: No fracture or mandibular dislocation. No destructive process. Orbits: Negative. No traumatic or inflammatory finding. Sinuses: Mild chronic mucosal thickening left maxillary sinus in scattered ethmoid air cells. No findings to suggest acute sinusitis. Soft tissues: Negative. CT CERVICAL SPINE FINDINGS Alignment: Degenerative retrolisthesis of C4 on 5 noted. Skull base and vertebrae: No acute fracture. No primary bone lesion or focal pathologic process. Soft tissues and spinal canal: No prevertebral fluid or swelling. No visible canal hematoma. Disc levels: Loss of disc height noted C4-5 and C5-6. Mild loss of disc height noted C6-7. Right-sided C5-6 facets are fused. Upper chest: See report for chest CT performed at the same time and dictated separately. Other: None. IMPRESSION: 1. No acute intracranial abnormality. 2. Atrophy with chronic small vessel ischemic disease. 3. No evidence for facial bone fracture. 4. No evidence for acute cervical spine fracture or subluxation. Degenerative changes in the cervical spine as above. Electronically Signed   By: Misty Stanley M.D.   On: 02/08/2022 15:41   CT Cervical Spine Wo Contrast  Result Date: 02/08/2022 CLINICAL DATA:  Fall down stairs.  Chronic anticoagulation. EXAM: CT HEAD WITHOUT CONTRAST CT MAXILLOFACIAL WITHOUT CONTRAST CT CERVICAL SPINE WITHOUT CONTRAST TECHNIQUE: Multidetector  CT imaging of the head, cervical spine, and maxillofacial structures were performed using the standard protocol without intravenous contrast. Multiplanar CT image reconstructions of the cervical spine and maxillofacial structures were also generated. RADIATION DOSE REDUCTION: This exam was performed according to the departmental dose-optimization  program which includes automated exposure control, adjustment of the mA and/or kV according to patient size and/or use of iterative reconstruction technique. COMPARISON:  None Available. FINDINGS: CT HEAD FINDINGS Brain: There is no evidence for acute hemorrhage, hydrocephalus, mass lesion, or abnormal extra-axial fluid collection. No definite CT evidence for acute infarction. Diffuse loss of parenchymal volume is consistent with atrophy. Patchy low attenuation in the deep hemispheric and periventricular white matter is nonspecific, but likely reflects chronic microvascular ischemic demyelination. Vascular: No hyperdense vessel or unexpected calcification. Skull: No evidence for fracture. No worrisome lytic or sclerotic lesion. Other: None. CT MAXILLOFACIAL FINDINGS Osseous: No fracture or mandibular dislocation. No destructive process. Orbits: Negative. No traumatic or inflammatory finding. Sinuses: Mild chronic mucosal thickening left maxillary sinus in scattered ethmoid air cells. No findings to suggest acute sinusitis. Soft tissues: Negative. CT CERVICAL SPINE FINDINGS Alignment: Degenerative retrolisthesis of C4 on 5 noted. Skull base and vertebrae: No acute fracture. No primary bone lesion or focal pathologic process. Soft tissues and spinal canal: No prevertebral fluid or swelling. No visible canal hematoma. Disc levels: Loss of disc height noted C4-5 and C5-6. Mild loss of disc height noted C6-7. Right-sided C5-6 facets are fused. Upper chest: See report for chest CT performed at the same time and dictated separately. Other: None. IMPRESSION: 1. No acute intracranial abnormality. 2. Atrophy with chronic small vessel ischemic disease. 3. No evidence for facial bone fracture. 4. No evidence for acute cervical spine fracture or subluxation. Degenerative changes in the cervical spine as above. Electronically Signed   By: Misty Stanley M.D.   On: 02/08/2022 15:41   CT Maxillofacial WO CM  Result Date:  02/08/2022 CLINICAL DATA:  Fall down stairs.  Chronic anticoagulation. EXAM: CT HEAD WITHOUT CONTRAST CT MAXILLOFACIAL WITHOUT CONTRAST CT CERVICAL SPINE WITHOUT CONTRAST TECHNIQUE: Multidetector CT imaging of the head, cervical spine, and maxillofacial structures were performed using the standard protocol without intravenous contrast. Multiplanar CT image reconstructions of the cervical spine and maxillofacial structures were also generated. RADIATION DOSE REDUCTION: This exam was performed according to the departmental dose-optimization program which includes automated exposure control, adjustment of the mA and/or kV according to patient size and/or use of iterative reconstruction technique. COMPARISON:  None Available. FINDINGS: CT HEAD FINDINGS Brain: There is no evidence for acute hemorrhage, hydrocephalus, mass lesion, or abnormal extra-axial fluid collection. No definite CT evidence for acute infarction. Diffuse loss of parenchymal volume is consistent with atrophy. Patchy low attenuation in the deep hemispheric and periventricular white matter is nonspecific, but likely reflects chronic microvascular ischemic demyelination. Vascular: No hyperdense vessel or unexpected calcification. Skull: No evidence for fracture. No worrisome lytic or sclerotic lesion. Other: None. CT MAXILLOFACIAL FINDINGS Osseous: No fracture or mandibular dislocation. No destructive process. Orbits: Negative. No traumatic or inflammatory finding. Sinuses: Mild chronic mucosal thickening left maxillary sinus in scattered ethmoid air cells. No findings to suggest acute sinusitis. Soft tissues: Negative. CT CERVICAL SPINE FINDINGS Alignment: Degenerative retrolisthesis of C4 on 5 noted. Skull base and vertebrae: No acute fracture. No primary bone lesion or focal pathologic process. Soft tissues and spinal canal: No prevertebral fluid or swelling. No visible canal hematoma. Disc levels: Loss of disc height noted C4-5 and C5-6. Mild loss of  disc height noted C6-7. Right-sided C5-6 facets are fused. Upper chest: See report for chest CT performed at the same time and dictated separately. Other: None. IMPRESSION: 1. No acute intracranial abnormality. 2. Atrophy with chronic small vessel ischemic disease. 3. No evidence for facial bone fracture. 4. No evidence for acute cervical spine fracture or subluxation. Degenerative changes in the cervical spine as above. Electronically Signed   By: Misty Stanley M.D.   On: 02/08/2022 15:41   DG Pelvis Portable  Result Date: 02/08/2022 CLINICAL DATA:  Fall.  On blood thinners. EXAM: PORTABLE PELVIS 1-2 VIEWS COMPARISON:  09/22/2010 FINDINGS: Status post bilateral hip arthroplasty. Osteopenia. No acute fracture or dislocation. IMPRESSION: No acute osseous abnormality. Electronically Signed   By: Abigail Miyamoto M.D.   On: 02/08/2022 14:02   DG Chest Port 1 View  Result Date: 02/08/2022 CLINICAL DATA:  Fall, on blood thinners, on initial encounter. EXAM: PORTABLE CHEST 1 VIEW COMPARISON:  10/08/2018. FINDINGS: Trachea is midline. Heart size is enlarged and accentuated by AP portable supine technique. Lungs are clear. Probable left infrahilar atelectasis. No definite pleural fluid or pneumothorax. Osseous structures appear grossly intact but osteopenic. Degenerative changes in the left shoulder. IMPRESSION: Left infrahilar atelectasis. Electronically Signed   By: Lorin Picket M.D.   On: 02/08/2022 14:02     Assessment/Plan: 88 year old who fell at home presented to the ED and trauma called [place] about his CT scan. CT showed acute compression fractures of L1 and L5 with about 45% vertebral height loss of L1 and some retropulsion into the canal. Given his age and risks of surgery I do not think he is a surgical candidate. Would recommend LSO brace when ambulating out of bed. Does not have to be worn when lying in bed. Will follow this as outpatient and sign off for now. Please call with any concerns or questions.     Ocie Cornfield Martrell Eguia 02/08/2022 4:56 PM

## 2022-02-08 NOTE — ED Notes (Signed)
Per previous RN, labs to be collected at 2130 per blood administration protocol

## 2022-02-08 NOTE — Progress Notes (Signed)
Orthopedic Tech Progress Note Patient Details:  [name] [birth year] [id number]  Ortho Devices Type of Ortho Device: Lumbar corsett Ortho Device/Splint Interventions: Ordered, Application, Adjustment   Post Interventions Patient Tolerated: Well Instructions Provided: Care of device, Adjustment of device  Karolee Stamps 02/08/2022, 9:01 PM

## 2022-02-08 NOTE — Progress Notes (Signed)
eLink Physician-Brief Progress Note Patient Name: ADRIAN DINOVO DOB: 1926-02-11 MRN: [id number]   Date of Service  02/08/2022  HPI/Events of Note  89/M with dementia, atrial fibrillation on eliquis, hypertension and dyslipidemia brought in after falling from the stair at home and he was found down by his family.  In the ED< pt was hypotensive and hypothermic.  CT showed acute L5 compression and L1 complete burst fracture along with 20 x 4.5 x 20cm bilateral lower back soft tissue hematoma.   Pt given Kcentra and transfused 2 units pRBCs.   BP 104/61, HR 73, RR 15, O2 sats 98% on RA.   eICU Interventions  Acute blood loss anemia Soft tissue hematomas Hypotension  No neurosurgical intervention recommended at this time.  Hold anti-hypertensives.  Continue supportive care.  SCDs for DVT prophylaxis.      Intervention Category Intermediate Interventions: Change in mental status - evaluation and management Evaluation Type: New Patient Evaluation  Elsie Lincoln 02/08/2022, 9:10 PM

## 2022-02-08 NOTE — Progress Notes (Signed)
   02/08/22 1400  Clinical Encounter Type  Visited With Patient  Visit Type Initial;ED;Trauma  Referral From Nurse  Consult/Referral To Chaplain  Spiritual Encounters  Spiritual Needs Prayer;Emotional  Stress Factors  Patient Stress Factors Loss of control   Chaplain amet with patient as he entered the ED Trauma Room.  Alert and talking.  No family present at this time - will continue to remainavailable for patient and family at this time.

## 2022-02-08 NOTE — ED Triage Notes (Signed)
Pt here as a  level 2 trauma after a fall on blood thinners , pt found at the bottom of the stair case , found today around 1200 was last seen by family at 9 pm last night

## 2022-02-08 NOTE — H&P (Signed)
NAME:  Gene Hunter, MRN:  [id number], DOB:  04/22/1926, LOS: 0 ADMISSION DATE:  02/08/2022, CONSULTATION DATE:  02/08/2022 REFERRING MD:  Francia Greaves, CHIEF COMPLAINT:  Hypotension after a fall ( On Xaralto)   History of Present Illness:  88 y/o M with dementia, DNR as of 2020  on eliquis for atrial fibrillation, HTN, Hyperlipidemia ,Admitted 02/08/2022 after  fall down 36f flight of stairs. Found down with Multiple abrasions. Hypotensive, hypothermic. Hgb 7's, WBC is 5.6, Platelets are 113,  got 2L IVF, on 1st unit of blood. FOBT neg, FAST neg. Pan imaging  revealed Mild pulmonary edema with bilateral trace to small volume pleural Effusions, A 20 x 4.5 x 20 cm bilateral lower back soft tissue hematoma formation. Acute L5 compression and L1 complete burst fracture. L1 vertebral body with at least 45% vertebral body height loss and 2 mm retropulsion into the central canal..  CTH and CT C-spine negative for acute traumatic injury. Per family, patient has been hallucinating and talking to his deceased wife for the last 5 days. Per ED notes, there is concern for sepsis, patient has been started on Vanc and Cefepime, which has subsequently been discontinued, as he is afebrile and WBC is normal. In the ED CK was 234. Troponin was 105, BUN 25, Creatinine 0.80, Total Protein 4.8, Albumin 2.7.  Lactate 1.7  PCCM have been called to admit for hypotension and manage care.  Pertinent  Medical History   Past Medical History:  Diagnosis Date   Blindness of right eye    BPH (benign prostatic hyperplasia)    Essential hypertension    HLD (hyperlipidemia)    Hypothyroidism    Atrial Fibrillation on Eliquis  Significant Hospital Events: Including procedures, antibiotic start and stop dates in addition to other pertinent events   Admission 02/08/2022  Interim History / Subjective:  Pt. States he does not have pain, he is cold. He does not remember fall Facial trauma and abrasions to back and face.  Objective    Blood pressure 99/63, pulse (!) 58, temperature (!) 96 F (35.6 C), temperature source Temporal, resp. rate 15, weight 67.2 kg, SpO2 100 %.       No intake or output data in the 24 hours ending 02/08/22 1637 Filed Weights   02/08/22 1600  Weight: 67.2 kg    Examination: General: Elderly  male, arouses to call of name, HOH, in no apparent distress HENT: Facial and head trauma , abrasions with bleeding noted. No JVD, No LAD Lungs: Bilateral chest excursion, clear throughout, diminished per bases,  Cardiovascular: S1, S2, Irr, No MRG Abdomen: Soft, NT, ND, BS + Extremities: Abrasions , no obvious deformities Neuro: Awake, follows commands, MAE x 4 GU: NA  Resolved Hospital Problem list     Assessment & Plan:  Traumatic Fall on blood thinners Hypotension, HGB 7.1, Platelets 113 Lower back hematoma  Plan Reverse Eliquis as ordered >> K centra No surgical intervention per Trauma Team Transfuse as ordered Trend CBC  IV Fluid as ordered  Trend CK Trend UO Check Cortisol TSH  Mild pulmonary edema with bilateral trace to small volume pleural Sats 100% on Room Air Plan CXR in am and prn Echo to evaluate for CHF Maintain sats > 94%  Troponin leak Plan Trend Troponin 12 Lead EKG now and prn  RO Sepsis Afebrile Hypothermic Plan BC sent UA pending  Follow cultures and monitor clinically  Hypothyroidism Plan  Continue Synthroid  BPH Plan Continue Alfuzosin   Plan is to  admit to the ICU overnight, and transfer out in am if he continues to improve.   PCCM APP : 51 minutes  Magdalen Spatz, MSN, AGACNP-BC [place] for personal pager PCCM on call pager [phone]  02/08/2022 4:44 PM      Best Practice (right click and "Reselect all SmartList Selections" daily)   Diet/type: NPO DVT prophylaxis: SCD GI prophylaxis: PPI Lines: N/A Foley:  N/A Code Status:  DNR Last date of multidisciplinary goals of care  discussion I called and spoke with the patient's daughter who confirmed the patient is to remain a DNR for this admission . I also updated her on the patient's condition and plan of care   Labs   CBC: Recent Labs  Lab 02/08/22 1352 02/08/22 1401  WBC 5.6  --   NEUTROABS 4.4  --   HGB 7.6* 7.1*  HCT 24.0* 21.0*  MCV 98.0  --   PLT 113*  --     Basic Metabolic Panel: Recent Labs  Lab 02/08/22 1352 02/08/22 1401  NA 141 140  K 4.8 4.8  CL 107 105  CO2 26  --   GLUCOSE 130* 126*  BUN 26* 25*  CREATININE 0.93 0.80  CALCIUM 8.9  --    GFR: Estimated Creatinine Clearance: 54.8 mL/min (by C-G formula based on SCr of 0.8 mg/dL). Recent Labs  Lab 02/08/22 1352  WBC 5.6  LATICACIDVEN 1.7    Liver Function Tests: Recent Labs  Lab 02/08/22 1352  AST 17  ALT 16  ALKPHOS 76  BILITOT 1.0  PROT 4.8*  ALBUMIN 2.7*   No results for input(s): "LIPASE", "AMYLASE" in the last 168 hours. No results for input(s): "AMMONIA" in the last 168 hours.  ABG    Component Value Date/Time   TCO2 25 02/08/2022 1401     Coagulation Profile: No results for input(s): "INR", "PROTIME" in the last 168 hours.  Cardiac Enzymes: Recent Labs  Lab 02/08/22 1352  CKTOTAL 234    HbA1C: No results found for: "HGBA1C"  CBG: No results for input(s): "GLUCAP" in the last 168 hours.  Review of Systems:   Pt. States he is cold   Past Medical History:  He,  has a past medical history of Blindness of right eye, BPH (benign prostatic hyperplasia), Essential hypertension, HLD (hyperlipidemia), and Hypothyroidism.   Surgical History:   Past Surgical History:  Procedure Laterality Date   bilateral hip replacement     ESOPHAGEAL MANOMETRY N/A 11/21/2017   Procedure: ESOPHAGEAL MANOMETRY (EM);  Surgeon: Carol Ada, MD;  Location: [facility];  Service: Endoscopy;  Laterality: N/A;   HERNIA REPAIR     JOINT REPLACEMENT     right knee replacement       Social History:   reports that  he has quit smoking. He has never used smokeless tobacco. He reports that he does not drink alcohol and does not use drugs.   Family History:  His family history includes Heart attack in his father and mother; Heart disease in his father.   Allergies Allergies  Allergen Reactions   Bee Venom Swelling and Other (See Comments)    Reaction:  All over body swelling    Milk-Related Compounds Rash   Coricidin D Cold-Flu-Sinus [Chlorphen-Pe-Acetaminophen]    Maxzide [Hydrochlorothiazide W-Triamterene] Palpitations     Home Medications  Prior to Admission medications   Medication Sig Start Date End Date Taking? Authorizing Provider  alfuzosin (UROXATRAL) 10 MG 24 hr tablet Take  10 mg by mouth daily. 09/25/18   [provider]  apixaban (ELIQUIS) 5 MG TABS tablet Take 1 tablet by mouth 2 (two) times daily.    [provider]  atorvastatin (LIPITOR) 20 MG tablet Take 20 mg by mouth daily.    [provider]  Calcium 600-400 MG-UNIT CHEW 1 tablet with meals    [provider]  calcium carbonate (OSCAL) 1500 (600 Ca) MG TABS tablet Take 600 mg of elemental calcium by mouth daily with breakfast.    [provider]  cholecalciferol (VITAMIN D) 1000 units tablet Take 1,000 Units by mouth daily.    [provider]  docusate sodium (COLACE) 100 MG capsule Take 100 mg by mouth daily.    [provider]  levothyroxine (SYNTHROID) 150 MCG tablet Take 150 mcg by mouth daily. 11/11/20   [provider]  Multiple Vitamins-Minerals (MULTIVITAMIN WITH MINERALS) tablet Take 1 tablet by mouth daily.    [provider]     Critical care time: 32 minutes   Magdalen Spatz, MSN, AGACNP-BC [place] for personal pager PCCM on call pager [phone]  02/08/2022 4:54 PM

## 2022-02-08 NOTE — Progress Notes (Signed)
Orthopedic Tech Progress Note Patient Details:  Gene Hunter Jun 04, 1926 [id number]  Level 2 trauma   Patient ID: Gene Hunter, male   DOB: 1925/09/27, 88 y.o.   MRN: [id number]  [name] 02/08/2022, 2:43 PM

## 2022-02-09 ENCOUNTER — Inpatient Hospital Stay (HOSPITAL_COMMUNITY): Payer: Medicare Other

## 2022-02-09 ENCOUNTER — Other Ambulatory Visit: Payer: Self-pay

## 2022-02-09 DIAGNOSIS — W19XXXA Unspecified fall, initial encounter: Secondary | ICD-10-CM | POA: Diagnosis not present

## 2022-02-09 DIAGNOSIS — I9589 Other hypotension: Secondary | ICD-10-CM

## 2022-02-09 DIAGNOSIS — R778 Other specified abnormalities of plasma proteins: Secondary | ICD-10-CM | POA: Diagnosis not present

## 2022-02-09 DIAGNOSIS — D649 Anemia, unspecified: Secondary | ICD-10-CM | POA: Diagnosis not present

## 2022-02-09 LAB — TYPE AND SCREEN
ABO/RH(D): A POS
Antibody Screen: NEGATIVE
Unit division: 0
Unit division: 0

## 2022-02-09 LAB — CBC
HCT: 27.6 % — ABNORMAL LOW (ref 39.0–52.0)
Hemoglobin: 9.3 g/dL — ABNORMAL LOW (ref 13.0–17.0)
MCH: 29.8 pg (ref 26.0–34.0)
MCHC: 33.7 g/dL (ref 30.0–36.0)
MCV: 88.5 fL (ref 80.0–100.0)
Platelets: 125 10*3/uL — ABNORMAL LOW (ref 150–400)
RBC: 3.12 MIL/uL — ABNORMAL LOW (ref 4.22–5.81)
RDW: 18.6 % — ABNORMAL HIGH (ref 11.5–15.5)
WBC: 8.6 10*3/uL (ref 4.0–10.5)
nRBC: 0 % (ref 0.0–0.2)

## 2022-02-09 LAB — BASIC METABOLIC PANEL
Anion gap: 11 (ref 5–15)
BUN: 28 mg/dL — ABNORMAL HIGH (ref 8–23)
CO2: 23 mmol/L (ref 22–32)
Calcium: 8.8 mg/dL — ABNORMAL LOW (ref 8.9–10.3)
Chloride: 108 mmol/L (ref 98–111)
Creatinine, Ser: 0.85 mg/dL (ref 0.61–1.24)
GFR, Estimated: >60 mL/min (ref >60)
Glucose, Bld: 87 mg/dL (ref 70–99)
Potassium: 4.5 mmol/L (ref 3.5–5.1)
Sodium: 142 mmol/L (ref 135–145)

## 2022-02-09 LAB — ECHOCARDIOGRAM COMPLETE
AR max vel: 1.34 cm2
AV Area VTI: 1.32 cm2
AV Area mean vel: 1.25 cm2
AV Mean grad: 9.5 mmHg
AV Peak grad: 13.5 mmHg
Ao pk vel: 1.84 m/s
Area-P 1/2: 5.13 cm2
Height: 72 in
S' Lateral: 3.1 cm
Weight: 2402.13 oz

## 2022-02-09 LAB — BPAM RBC
Blood Product Expiration Date: 202307102359
Blood Product Expiration Date: 202307212359
ISSUE DATE / TIME: 202307031436
ISSUE DATE / TIME: 202307031542
Unit Type and Rh: 5100
Unit Type and Rh: 6200

## 2022-02-09 LAB — BLOOD PRODUCT ORDER (VERBAL) VERIFICATION

## 2022-02-09 LAB — CK: Total CK: 260 U/L (ref 49–397)

## 2022-02-09 MED ORDER — LEVOTHYROXINE SODIUM 75 MCG PO TABS
150.0000 ug | ORAL_TABLET | Freq: Every day | ORAL | Status: DC
Start: 2022-02-09 — End: 2022-02-11
  Administered 2022-02-09 – 2022-02-11 (×3): 150 ug via ORAL
  Filled 2022-02-09 (×3): qty 2

## 2022-02-09 MED ORDER — LACTATED RINGERS IV SOLN: INTRAVENOUS | Status: DC

## 2022-02-09 MED ORDER — ALFUZOSIN HCL ER 10 MG PO TB24
10.0000 mg | ORAL_TABLET | Freq: Every day | ORAL | Status: DC
  Administered 2022-02-09 – 2022-02-11 (×3): 10 mg via ORAL
  Filled 2022-02-09 (×4): qty 1

## 2022-02-09 MED ORDER — ATORVASTATIN CALCIUM 10 MG PO TABS
20.0000 mg | ORAL_TABLET | Freq: Every day | ORAL | Status: DC
  Administered 2022-02-09 – 2022-02-11 (×3): 20 mg via ORAL
  Filled 2022-02-09 (×3): qty 2

## 2022-02-09 NOTE — Progress Notes (Signed)
[place] Progress Note Patient Name: KOLBE DELMONACO DOB: Dec 12, 1925 MRN: [id number]   Date of Service  02/09/2022  HPI/Events of Note  Notified of hematuria after foley insertion.  No obstruction or clots noted.   eICU Interventions  Continue to monitor for now.      Intervention Category Intermediate Interventions: Other:  Elsie Lincoln 02/09/2022, 9:03 PM

## 2022-02-09 NOTE — Evaluation (Signed)
Clinical/Bedside Swallow Evaluation Patient Details  Name: Gene Hunter MRN: [id number] Date of Birth: 25-Jun-1926  Today's Date: 02/09/2022 Time: SLP Start Time (ACUTE ONLY): 11 SLP Stop Time (ACUTE ONLY): 8119 SLP Time Calculation (min) (ACUTE ONLY): 20 min  Past Medical History:  Past Medical History:  Diagnosis Date   Blindness of right eye    BPH (benign prostatic hyperplasia)    Essential hypertension    HLD (hyperlipidemia)    Hypothyroidism    Past Surgical History:  Past Surgical History:  Procedure Laterality Date   bilateral hip replacement     ESOPHAGEAL MANOMETRY N/A 11/21/2017   Procedure: ESOPHAGEAL MANOMETRY (EM);  Surgeon: Carol Ada, MD;  Location: [facility];  Service: Endoscopy;  Laterality: N/A;   HERNIA REPAIR     JOINT REPLACEMENT     right knee replacement     HPI:  Pt is a 88 yo male presenting 7/3 s/p fall at home down a flight of stairs. CTH negative for acute findings; CT CAP w/ L5 compression fx and L1 incomplete burst fx. PMH includes: dementia, blindness of L eye, BPH, HTN, hypothyroidism    Assessment / Plan / Recommendation  Clinical Impression  Pt has frequent coughing with thin liquids, associated with multiple swallows and the appearance outwardly of having reduced coordination of swallowing. His swallow appears to be more functional with single ice chips and purees, although he does need intermittent cues for awareness and acceptance. Throughout testing he needed coaxing and frequent redirection to try to attempt PO trials, and is talking about daily events that did not happen. He does not consistently follow commands. His daughter at bedside says he does not have overt difficulties swallowing at home, but that his mentation now is very different from his baseline. Recommend starting with bites of puree from the floor stuck as he cognitively will accept. Could also offer ice chips, being diligent about oral care. SLP will f/u to see if  mentation improves to be able to begin PO diet.  SLP Visit Diagnosis: Dysphagia, unspecified (R13.10)    Aspiration Risk  Moderate aspiration risk;Risk for inadequate nutrition/hydration    Diet Recommendation Ice chips PRN after oral care;Other (Comment) (bites of puree from floor stock)   Medication Administration: Crushed with puree Supervision: Full supervision/cueing for compensatory strategies Compensations: Minimize environmental distractions;Slow rate;Small sips/bites Postural Changes: Seated upright at 90 degrees    Other  Recommendations Oral Care Recommendations: Oral care QID Other Recommendations: Have oral suction available    Recommendations for follow up therapy are one component of a multi-disciplinary discharge planning process, led by the attending physician.  Recommendations may be updated based on patient status, additional functional criteria and insurance authorization.  Follow up Recommendations Skilled nursing-short term rehab (<3 hours/day)      Assistance Recommended at Discharge Frequent or constant Supervision/Assistance  Functional Status Assessment Patient has had a recent decline in their functional status and demonstrates the ability to make significant improvements in function in a reasonable and predictable amount of time.  Frequency and Duration min 2x/week  2 weeks       Prognosis Prognosis for Safe Diet Advancement: Good Barriers to Reach Goals: Cognitive deficits      Swallow Study   General HPI: Pt is a 88 yo male presenting 7/3 s/p fall at home down a flight of stairs. CTH negative for acute findings; CT CAP w/ L5 compression fx and L1 incomplete burst fx. PMH includes: dementia, blindness of L eye, BPH, HTN, hypothyroidism  Type of Study: Bedside Swallow Evaluation Previous Swallow Assessment: none in chart Diet Prior to this Study: NPO Temperature Spikes Noted: No Respiratory Status: Room air History of Recent Intubation:  No Behavior/Cognition: Alert;Confused;Requires cueing;Distractible Oral Cavity Assessment: Within Functional Limits Oral Care Completed by SLP: No Oral Cavity - Dentition: Adequate natural dentition Vision: Impaired for self-feeding Self-Feeding Abilities: Total assist Patient Positioning: Upright in bed Baseline Vocal Quality: Normal Volitional Cough: Cognitively unable to elicit Volitional Swallow: Unable to elicit    Oral/Motor/Sensory Function Overall Oral Motor/Sensory Function:  (difficulty following commands but no overt focal deficits)   Ice Chips Ice chips: Within functional limits Presentation: Spoon   Thin Liquid Thin Liquid: Impaired Presentation: Cup;Spoon;Straw Pharyngeal  Phase Impairments: Cough - Immediate;Multiple swallows    Nectar Thick Nectar Thick Liquid: Not tested   Honey Thick Honey Thick Liquid: Not tested   Puree Puree: Within functional limits Presentation: Spoon   Solid     Solid: Not tested      Gene Bond., M.A. [place] Office [phone]  Secure chat preferred  02/09/2022,1:06 PM

## 2022-02-09 NOTE — Progress Notes (Addendum)
   NAME:  Gene Hunter, MRN:  [id number], DOB:  [birth year], LOS: 1 ADMISSION DATE:  02/08/2022, CONSULTATION DATE:  02/08/2022 REFERRING MD:  Francia Greaves, CHIEF COMPLAINT:  Hypotension after a fall ( On Xarelto)   History of Present Illness:  88 y/o M with dementia, DNR as of 2020  on eliquis for atrial fibrillation, HTN, Hyperlipidemia ,Admitted 02/08/2022 after  fall down 1f flight of stairs. Found down with Multiple abrasions. Hypotensive, hypothermic. Hgb 7's, WBC is 5.6, Platelets are 113,  got 2L IVF, on 1st unit of blood. FOBT neg, FAST neg. Pan imaging  revealed Mild pulmonary edema with bilateral trace to small volume pleural Effusions, A 20 x 4.5 x 20 cm bilateral lower back soft tissue hematoma formation. Acute L5 compression and L1 complete burst fracture. L1 vertebral body with at least 45% vertebral body height loss and 2 mm retropulsion into the central canal..  CTH and CT C-spine negative for acute traumatic injury. Per family, patient has been hallucinating and talking to his deceased wife for the last 5 days. Per ED notes, there is concern for sepsis, patient has been started on Vanc and Cefepime, which has subsequently been discontinued, as he is afebrile and WBC is normal. In the ED CK was 234. Troponin was 105, BUN 25, Creatinine 0.80, Total Protein 4.8, Albumin 2.7.  Lactate 1.7  PCCM have been called to admit for hypotension and manage care.  Pertinent  Medical History   Past Medical History:  Diagnosis Date   Blindness of right eye    BPH (benign prostatic hyperplasia)    Essential hypertension    HLD (hyperlipidemia)    Hypothyroidism    Atrial Fibrillation on Eliquis  Significant Hospital Events: Including procedures, antibiotic start and stop dates in addition to other pertinent events   Admission 02/08/2022  Interim History / Subjective:  No events, pleasantly confused.  Objective   Blood pressure (!) 88/57, pulse 79, temperature 98.9 F (37.2 C), temperature  source Axillary, resp. rate 16, height 6' (1.829 m), weight 68.1 kg, SpO2 97 %.        Intake/Output Summary (Last 24 hours) at 02/09/2022 1050 Last data filed at 02/09/2022 0700 Gross per 24 hour  Intake --  Output 650 ml  Net -650 ml   Filed Weights   02/08/22 1600 02/08/22 2032 02/09/22 0500  Weight: 67.2 kg 68.1 kg 68.1 kg    Examination: Pleasantly confused AOx1 Moves all 4 ext to command L pupillary opacification noted Lungs clear Mild TTP of flanks Heart sounds regular  Hgb good, plts better  Resolved Hospital Problem list     Assessment & Plan:  Traumatic Fall on blood thinners Hypotension, HGB 7.1, Platelets 113 Lower back hematoma  Hx afib on eliquis- received PP[place] StripH/H and BPs responded to transfusion - Trend CBC - Hold AC - Abdominal binder, appreciate trauma input  Troponin leak- essentially flat, would not trend as there is no intervention to offer  RO Sepsis Afebrile Hypothermic Plan BC sent UA pending  Follow cultures and monitor clinically, no role for abx at present; do not think infected  Hypothyroidism Plan  Restart Synthroid  BPH Plan Restart Alfuzosin  Dementia, ?ability to live alone- SW/PT/OT consults; consider palliative care involvement if does not bounce back quick  Stable for transfer to progressive, appreciate TRH taking over starting 02/10/22

## 2022-02-09 NOTE — TOC CAGE-AID Note (Signed)
Transition of Care [facility]) - CAGE-AID Screening   Patient Details  Name: Gene Hunter MRN: [id number] Date of Birth: 1926/06/29  Transition of Care [facility]) CM/SW Contact:    Benard Halsted, LCSW Phone Number: 02/09/2022, 11:10 AM   Clinical Narrative: Patient currently disoriented and unable to participate in screening.    CAGE-AID Screening: Substance Abuse Screening unable to be completed due to: : Patient unable to participate

## 2022-02-09 NOTE — Progress Notes (Signed)
Subjective: Transfused, hgb 9.3 yesterday. Stable this morning. No acute complaints.   Objective: Vital signs in last 24 hours: Temp:  [89.9 F (32.2 C)-98.9 F (37.2 C)] 98.9 F (37.2 C) (07/04 0800) Pulse Rate:  [49-93] 79 (07/04 0800) Resp:  [10-22] 16 (07/04 0800) BP: (72-119)/(47-91) 88/57 (07/04 0800) SpO2:  [97 %-100 %] 97 % (07/04 0800) Weight:  [67.2 kg-68.1 kg] 68.1 kg (07/04 0500)    Intake/Output from previous day: 07/03 0701 - 07/04 0700 In: -  Out: 650 [Urine:650] Intake/Output this shift: No intake/output data recorded.  PE: General: resting comfortably, NAD Neuro: alert and oriented, no focal deficits Resp: normal work of breathing Abdomen: soft, nondistended, nontender to palpation. Hematoma left flank.   Lab Results:  Recent Labs    02/08/22 1352 02/08/22 1401 02/09/22 0257  WBC 5.6  --  8.6  HGB 7.6* 7.1* 9.3*  HCT 24.0* 21.0* 27.6*  PLT 113*  --  125*   BMET Recent Labs    02/08/22 1352 02/08/22 1401 02/09/22 0257  NA 141 140 142  K 4.8 4.8 4.5  CL 107 105 108  CO2 26  --  23  GLUCOSE 130* 126* 87  BUN 26* 25* 28*  CREATININE 0.93 0.80 0.85  CALCIUM 8.9  --  8.8*   PT/INR No results for input(s): "LABPROT", "INR" in the last 72 hours. CMP     Component Value Date/Time   NA 142 02/09/2022 0257   NA 139 01/05/2019 0951   K 4.5 02/09/2022 0257   CL 108 02/09/2022 0257   CO2 23 02/09/2022 0257   GLUCOSE 87 02/09/2022 0257   BUN 28 (H) 02/09/2022 0257   BUN 18 01/05/2019 0951   CREATININE 0.85 02/09/2022 0257   CALCIUM 8.8 (L) 02/09/2022 0257   PROT 4.8 (L) 02/08/2022 1352   PROT 6.2 01/05/2019 0951   ALBUMIN 2.7 (L) 02/08/2022 1352   ALBUMIN 4.2 01/05/2019 0951   AST 17 02/08/2022 1352   ALT 16 02/08/2022 1352   ALKPHOS 76 02/08/2022 1352   BILITOT 1.0 02/08/2022 1352   BILITOT 0.7 01/05/2019 0951   GFRNONAA >60 02/09/2022 0257   GFRAA 87 01/05/2019 0951   Lipase  No results found for:  "LIPASE"     Studies/Results: CT CHEST ABDOMEN PELVIS W CONTRAST  Result Date: 02/08/2022 CLINICAL DATA:  Chest trauma, blunt. Fall. pt found at the bottom of the stair case EXAM: CT CHEST, ABDOMEN, AND PELVIS WITH CONTRAST TECHNIQUE: Multidetector CT imaging of the chest, abdomen and pelvis was performed following the standard protocol during bolus administration of intravenous contrast. RADIATION DOSE REDUCTION: This exam was performed according to the departmental dose-optimization program which includes automated exposure control, adjustment of the mA and/or kV according to patient size and/or use of iterative reconstruction technique. CONTRAST:  57m OMNIPAQUE IOHEXOL 300 MG/ML  SOLN COMPARISON:  CT hip left 05/05/2017 FINDINGS: CHEST: Ports and Devices: None. Lungs/airways: Mild bilateral upper lobe interlobular septal wall thickening. No focal consolidation. No pulmonary nodule. No pulmonary mass. No pulmonary contusion or laceration. No pneumatocele formation. The central airways are patent. Pleura: Bilateral trace to small volume pleural effusions. No pneumothorax. No hemothorax. Lymph Nodes: No mediastinal, hilar, or axillary lymphadenopathy. Mediastinum: No pneumomediastinum. No aortic injury or mediastinal hematoma. The thoracic aorta is normal in caliber. Moderate to severe atherosclerotic plaque with aortic valve leaflet calcification and four-vessel coronary calcification. The heart is normal in size. No significant pericardial effusion. The esophagus is unremarkable. The  thyroid is unremarkable. Chest Wall / Breasts: No chest wall mass. Musculoskeletal: No acute rib or sternal fracture. Grossly stable chronic T12 compression fracture with at least 35 % vertebral body height loss. Bilateral moderate severe shoulder degenerative changes, left greater than right. ABDOMEN / PELVIS: Liver: Not enlarged. No focal lesion. No laceration or subcapsular hematoma. Biliary System: The gallbladder is  otherwise unremarkable with no radio-opaque gallstones. No biliary ductal dilatation. Pancreas: Normal pancreatic contour. No main pancreatic duct dilatation. Spleen: Not enlarged. No focal lesion. No laceration, subcapsular hematoma, or vascular injury. Adrenal Glands: No nodularity bilaterally. Kidneys: Bilateral kidneys enhance symmetrically. No hydronephrosis. No contusion, laceration, or subcapsular hematoma. No injury to the vascular structures or collecting systems. No hydroureter. The urinary bladder is unremarkable. Bowel: No small or large bowel wall thickening or dilatation. Colonic diverticulosis. The appendix is not definitely identified with no inflammatory changes in the right lower quadrant to suggest acute appendicitis. Mesentery, Omentum, and Peritoneum: No simple free fluid ascites. No pneumoperitoneum. No hemoperitoneum. No mesenteric hematoma identified. No organized fluid collection. Pelvic Organs: Normal. Lymph Nodes: No abdominal, pelvic, inguinal lymphadenopathy. Vasculature: Severe atherosclerotic plaque. No abdominal aorta or iliac aneurysm. No active contrast extravasation or pseudoaneurysm. Musculoskeletal: A 20 x 4.5 x 20 cm bilateral lower back soft tissue hematoma formation. Associated diffuse back subcutaneus soft tissue edema. No definite acute pelvic fracture. Bilateral total hip arthroplasty. Lucency surrounding the left femoral shaft surgical hardware is a chronic finding. Acute L5 compression fracture with at least 35% vertebral body height loss. Acute L1 burst fracture with at least 45% vertebral body height loss and 2 mm retropulsion into the central canal. IMPRESSION: 1. A 20 x 4.5 x 20 cm bilateral lower back soft tissue hematoma formation. 2. Acute L5 compression and L1 complete burst fracture. L1 vertebral body with at least 45% vertebral body height loss and 2 mm retropulsion into the central canal. 3. No acute traumatic injury to the chest, abdomen, or pelvis. 4.  Bilateral total hip arthroplasty. Lucency surrounding the left femoral shaft surgical hardware is a chronic finding with loosening or infection not excluded. Other imaging findings of potential clinical significance: 1. Mild pulmonary edema with bilateral trace to small volume pleural effusions. 2. Chronic stable T12 compression fracture. 3. Aortic Atherosclerosis (ICD10-I70.0) including four-vessel coronary calcification and aortic valve leaflet calcifications-correlate for aortic stenosis. Electronically Signed   By: Iven Finn M.D.   On: 02/08/2022 15:47   CT Head Wo Contrast  Result Date: 02/08/2022 CLINICAL DATA:  Fall down stairs.  Chronic anticoagulation. EXAM: CT HEAD WITHOUT CONTRAST CT MAXILLOFACIAL WITHOUT CONTRAST CT CERVICAL SPINE WITHOUT CONTRAST TECHNIQUE: Multidetector CT imaging of the head, cervical spine, and maxillofacial structures were performed using the standard protocol without intravenous contrast. Multiplanar CT image reconstructions of the cervical spine and maxillofacial structures were also generated. RADIATION DOSE REDUCTION: This exam was performed according to the departmental dose-optimization program which includes automated exposure control, adjustment of the mA and/or kV according to patient size and/or use of iterative reconstruction technique. COMPARISON:  None Available. FINDINGS: CT HEAD FINDINGS Brain: There is no evidence for acute hemorrhage, hydrocephalus, mass lesion, or abnormal extra-axial fluid collection. No definite CT evidence for acute infarction. Diffuse loss of parenchymal volume is consistent with atrophy. Patchy low attenuation in the deep hemispheric and periventricular white matter is nonspecific, but likely reflects chronic microvascular ischemic demyelination. Vascular: No hyperdense vessel or unexpected calcification. Skull: No evidence for fracture. No worrisome lytic or sclerotic lesion. Other: None. CT MAXILLOFACIAL  FINDINGS Osseous: No  fracture or mandibular dislocation. No destructive process. Orbits: Negative. No traumatic or inflammatory finding. Sinuses: Mild chronic mucosal thickening left maxillary sinus in scattered ethmoid air cells. No findings to suggest acute sinusitis. Soft tissues: Negative. CT CERVICAL SPINE FINDINGS Alignment: Degenerative retrolisthesis of C4 on 5 noted. Skull base and vertebrae: No acute fracture. No primary bone lesion or focal pathologic process. Soft tissues and spinal canal: No prevertebral fluid or swelling. No visible canal hematoma. Disc levels: Loss of disc height noted C4-5 and C5-6. Mild loss of disc height noted C6-7. Right-sided C5-6 facets are fused. Upper chest: See report for chest CT performed at the same time and dictated separately. Other: None. IMPRESSION: 1. No acute intracranial abnormality. 2. Atrophy with chronic small vessel ischemic disease. 3. No evidence for facial bone fracture. 4. No evidence for acute cervical spine fracture or subluxation. Degenerative changes in the cervical spine as above. Electronically Signed   By: Misty Stanley M.D.   On: 02/08/2022 15:41   CT Cervical Spine Wo Contrast  Result Date: 02/08/2022 CLINICAL DATA:  Fall down stairs.  Chronic anticoagulation. EXAM: CT HEAD WITHOUT CONTRAST CT MAXILLOFACIAL WITHOUT CONTRAST CT CERVICAL SPINE WITHOUT CONTRAST TECHNIQUE: Multidetector CT imaging of the head, cervical spine, and maxillofacial structures were performed using the standard protocol without intravenous contrast. Multiplanar CT image reconstructions of the cervical spine and maxillofacial structures were also generated. RADIATION DOSE REDUCTION: This exam was performed according to the departmental dose-optimization program which includes automated exposure control, adjustment of the mA and/or kV according to patient size and/or use of iterative reconstruction technique. COMPARISON:  None Available. FINDINGS: CT HEAD FINDINGS Brain: There is no evidence for  acute hemorrhage, hydrocephalus, mass lesion, or abnormal extra-axial fluid collection. No definite CT evidence for acute infarction. Diffuse loss of parenchymal volume is consistent with atrophy. Patchy low attenuation in the deep hemispheric and periventricular white matter is nonspecific, but likely reflects chronic microvascular ischemic demyelination. Vascular: No hyperdense vessel or unexpected calcification. Skull: No evidence for fracture. No worrisome lytic or sclerotic lesion. Other: None. CT MAXILLOFACIAL FINDINGS Osseous: No fracture or mandibular dislocation. No destructive process. Orbits: Negative. No traumatic or inflammatory finding. Sinuses: Mild chronic mucosal thickening left maxillary sinus in scattered ethmoid air cells. No findings to suggest acute sinusitis. Soft tissues: Negative. CT CERVICAL SPINE FINDINGS Alignment: Degenerative retrolisthesis of C4 on 5 noted. Skull base and vertebrae: No acute fracture. No primary bone lesion or focal pathologic process. Soft tissues and spinal canal: No prevertebral fluid or swelling. No visible canal hematoma. Disc levels: Loss of disc height noted C4-5 and C5-6. Mild loss of disc height noted C6-7. Right-sided C5-6 facets are fused. Upper chest: See report for chest CT performed at the same time and dictated separately. Other: None. IMPRESSION: 1. No acute intracranial abnormality. 2. Atrophy with chronic small vessel ischemic disease. 3. No evidence for facial bone fracture. 4. No evidence for acute cervical spine fracture or subluxation. Degenerative changes in the cervical spine as above. Electronically Signed   By: Misty Stanley M.D.   On: 02/08/2022 15:41   CT Maxillofacial WO CM  Result Date: 02/08/2022 CLINICAL DATA:  Fall down stairs.  Chronic anticoagulation. EXAM: CT HEAD WITHOUT CONTRAST CT MAXILLOFACIAL WITHOUT CONTRAST CT CERVICAL SPINE WITHOUT CONTRAST TECHNIQUE: Multidetector CT imaging of the head, cervical spine, and maxillofacial  structures were performed using the standard protocol without intravenous contrast. Multiplanar CT image reconstructions of the cervical spine and maxillofacial structures were also generated.  RADIATION DOSE REDUCTION: This exam was performed according to the departmental dose-optimization program which includes automated exposure control, adjustment of the mA and/or kV according to patient size and/or use of iterative reconstruction technique. COMPARISON:  None Available. FINDINGS: CT HEAD FINDINGS Brain: There is no evidence for acute hemorrhage, hydrocephalus, mass lesion, or abnormal extra-axial fluid collection. No definite CT evidence for acute infarction. Diffuse loss of parenchymal volume is consistent with atrophy. Patchy low attenuation in the deep hemispheric and periventricular white matter is nonspecific, but likely reflects chronic microvascular ischemic demyelination. Vascular: No hyperdense vessel or unexpected calcification. Skull: No evidence for fracture. No worrisome lytic or sclerotic lesion. Other: None. CT MAXILLOFACIAL FINDINGS Osseous: No fracture or mandibular dislocation. No destructive process. Orbits: Negative. No traumatic or inflammatory finding. Sinuses: Mild chronic mucosal thickening left maxillary sinus in scattered ethmoid air cells. No findings to suggest acute sinusitis. Soft tissues: Negative. CT CERVICAL SPINE FINDINGS Alignment: Degenerative retrolisthesis of C4 on 5 noted. Skull base and vertebrae: No acute fracture. No primary bone lesion or focal pathologic process. Soft tissues and spinal canal: No prevertebral fluid or swelling. No visible canal hematoma. Disc levels: Loss of disc height noted C4-5 and C5-6. Mild loss of disc height noted C6-7. Right-sided C5-6 facets are fused. Upper chest: See report for chest CT performed at the same time and dictated separately. Other: None. IMPRESSION: 1. No acute intracranial abnormality. 2. Atrophy with chronic small vessel  ischemic disease. 3. No evidence for facial bone fracture. 4. No evidence for acute cervical spine fracture or subluxation. Degenerative changes in the cervical spine as above. Electronically Signed   By: Misty Stanley M.D.   On: 02/08/2022 15:41   DG Pelvis Portable  Result Date: 02/08/2022 CLINICAL DATA:  Fall.  On blood thinners. EXAM: PORTABLE PELVIS 1-2 VIEWS COMPARISON:  09/22/2010 FINDINGS: Status post bilateral hip arthroplasty. Osteopenia. No acute fracture or dislocation. IMPRESSION: No acute osseous abnormality. Electronically Signed   By: Abigail Miyamoto M.D.   On: 02/08/2022 14:02   DG Chest Port 1 View  Result Date: 02/08/2022 CLINICAL DATA:  Fall, on blood thinners, on initial encounter. EXAM: PORTABLE CHEST 1 VIEW COMPARISON:  10/08/2018. FINDINGS: Trachea is midline. Heart size is enlarged and accentuated by AP portable supine technique. Lungs are clear. Probable left infrahilar atelectasis. No definite pleural fluid or pneumothorax. Osseous structures appear grossly intact but osteopenic. Degenerative changes in the left shoulder. IMPRESSION: Left infrahilar atelectasis. Electronically Signed   By: Lorin Picket M.D.   On: 02/08/2022 14:02        Assessment/Plan  88 yo male s/p fall at home on blood thinners. Received Kcentra and PRBCs to reverse Eliquis.  - L flank hematoma: Eliquis on hold. Trend hgb/hct until stable. Recommend abdominal binder.  - Ok to advance diet - Continue holding anticoagulation - Remainder of care per CCM. Trauma will follow.    LOS: 1 day    Michaelle Birks, MD [facility] General, Hepatobiliary and Pancreatic Surgery 02/09/22 12:13 PM

## 2022-02-10 DIAGNOSIS — G9341 Metabolic encephalopathy: Secondary | ICD-10-CM

## 2022-02-10 DIAGNOSIS — E039 Hypothyroidism, unspecified: Secondary | ICD-10-CM | POA: Diagnosis not present

## 2022-02-10 DIAGNOSIS — R310 Gross hematuria: Secondary | ICD-10-CM

## 2022-02-10 DIAGNOSIS — S32010K Wedge compression fracture of first lumbar vertebra, subsequent encounter for fracture with nonunion: Secondary | ICD-10-CM

## 2022-02-10 DIAGNOSIS — S32010A Wedge compression fracture of first lumbar vertebra, initial encounter for closed fracture: Secondary | ICD-10-CM

## 2022-02-10 DIAGNOSIS — D62 Acute posthemorrhagic anemia: Secondary | ICD-10-CM

## 2022-02-10 DIAGNOSIS — I482 Chronic atrial fibrillation, unspecified: Secondary | ICD-10-CM

## 2022-02-10 DIAGNOSIS — L8990 Pressure ulcer of unspecified site, unspecified stage: Secondary | ICD-10-CM | POA: Insufficient documentation

## 2022-02-10 DIAGNOSIS — R338 Other retention of urine: Secondary | ICD-10-CM

## 2022-02-10 DIAGNOSIS — T68XXXD Hypothermia, subsequent encounter: Secondary | ICD-10-CM

## 2022-02-10 DIAGNOSIS — W19XXXD Unspecified fall, subsequent encounter: Secondary | ICD-10-CM

## 2022-02-10 DIAGNOSIS — N400 Enlarged prostate without lower urinary tract symptoms: Secondary | ICD-10-CM

## 2022-02-10 DIAGNOSIS — T68XXXA Hypothermia, initial encounter: Secondary | ICD-10-CM

## 2022-02-10 DIAGNOSIS — D649 Anemia, unspecified: Secondary | ICD-10-CM | POA: Diagnosis not present

## 2022-02-10 DIAGNOSIS — R1310 Dysphagia, unspecified: Secondary | ICD-10-CM

## 2022-02-10 LAB — BASIC METABOLIC PANEL
Anion gap: 14 (ref 5–15)
BUN: 33 mg/dL — ABNORMAL HIGH (ref 8–23)
CO2: 21 mmol/L — ABNORMAL LOW (ref 22–32)
Calcium: 8.9 mg/dL (ref 8.9–10.3)
Chloride: 107 mmol/L (ref 98–111)
Creatinine, Ser: 0.98 mg/dL (ref 0.61–1.24)
GFR, Estimated: >60 mL/min (ref >60)
Glucose, Bld: 76 mg/dL (ref 70–99)
Potassium: 4.4 mmol/L (ref 3.5–5.1)
Sodium: 142 mmol/L (ref 135–145)

## 2022-02-10 LAB — CK: Total CK: 305 U/L (ref 49–397)

## 2022-02-10 LAB — URINALYSIS, MICROSCOPIC (REFLEX): RBC / HPF: >50 RBC/hpf (ref 0–5)

## 2022-02-10 LAB — CBC
HCT: 24.2 % — ABNORMAL LOW (ref 39.0–52.0)
Hemoglobin: 8.1 g/dL — ABNORMAL LOW (ref 13.0–17.0)
MCH: 29.7 pg (ref 26.0–34.0)
MCHC: 33.5 g/dL (ref 30.0–36.0)
MCV: 88.6 fL (ref 80.0–100.0)
Platelets: 115 10*3/uL — ABNORMAL LOW (ref 150–400)
RBC: 2.73 MIL/uL — ABNORMAL LOW (ref 4.22–5.81)
RDW: 18.6 % — ABNORMAL HIGH (ref 11.5–15.5)
WBC: 7.1 10*3/uL (ref 4.0–10.5)
nRBC: 0 % (ref 0.0–0.2)

## 2022-02-10 LAB — IRON AND TIBC
Iron: 14 ug/dL — ABNORMAL LOW (ref 45–182)
Saturation Ratios: 5 % — ABNORMAL LOW (ref 17.9–39.5)
TIBC: 286 ug/dL (ref 250–450)
UIBC: 272 ug/dL

## 2022-02-10 LAB — URINALYSIS, ROUTINE W REFLEX MICROSCOPIC

## 2022-02-10 LAB — VITAMIN B12: Vitamin B-12: 1387 pg/mL — ABNORMAL HIGH (ref 180–914)

## 2022-02-10 LAB — FOLATE: Folate: 13.3 ng/mL (ref >5.9)

## 2022-02-10 LAB — FERRITIN: Ferritin: 83 ng/mL (ref 24–336)

## 2022-02-10 NOTE — Progress Notes (Signed)
Speech Language Pathology Treatment: Dysphagia  Patient Details Name: Gene Hunter MRN: [id number] DOB: 04-18-1926 Today's Date: 02/10/2022 Time: 1443-1540 SLP Time Calculation (min) (ACUTE ONLY): 12 min  Assessment / Plan / Recommendation Clinical Impression  Pt was seen for dysphagia treatment. He was alert and cooperative, but mentation continues to be impaired. Pt exhibited symptoms of pharyngeal dysphagia characterized by multiple swallows, a wet vocal quality, throat clearing and coughing with thin liquids, nectar thick liquids, and full-tsp boluses of purees. An NPO status is recommended with allowance of critical meds crushed and given in 1/2 tsp boluses of puree. A modified barium swallow study is recommended to further assess swallow function, but cannot be scheduled until 7/6. Due pt's lack of improvement in swallow function, SLP recommends that short-term non-oral alimentation (e.g., Cortrak) be considered. Dr. Cline Cools advised of pt's performance and recommendations.    HPI HPI: Pt is a 88 yo male presenting 7/3 s/p fall at home down a flight of stairs. CTH negative for acute findings; CT CAP w/ L5 compression fx and L1 incomplete burst fx. PMH includes: dementia, blindness of L eye, BPH, HTN, hypothyroidism      SLP Plan  MBS      Recommendations for follow up therapy are one component of a multi-disciplinary discharge planning process, led by the attending physician.  Recommendations may be updated based on patient status, additional functional criteria and insurance authorization.    Recommendations  Diet recommendations: NPO (Consider short-term enteral nutrition) Medication Administration: Crushed with puree (1/2 tsp boluses) Compensations: Minimize environmental distractions;Slow rate;Small sips/bites                Oral Care Recommendations: Oral care QID Follow Up Recommendations: Skilled nursing-short term rehab (<3 hours/day) Assistance recommended at  discharge: Frequent or constant Supervision/Assistance SLP Visit Diagnosis: Dysphagia, unspecified (R13.10) Plan: MBS         Renald Haithcock I. Hardin Negus, [place], [place] Office number [phone] Pager [place]  02/10/2022, 3:38 PM

## 2022-02-10 NOTE — Plan of Care (Signed)
  Problem: Safety: Goal: Non-violent Restraint(s) Outcome: Not Progressing   

## 2022-02-10 NOTE — Progress Notes (Signed)
Patient ID: Gene Hunter, male   DOB: 07/09/1926, 88 y.o.   MRN: [id number]      Subjective: Not offering complaint ROS negative except as listed above. Objective: Vital signs in last 24 hours: Temp:  [98 F (36.7 C)-98.3 F (36.8 C)] 98.2 F (36.8 C) (07/05 0800) Pulse Rate:  [68-96] 89 (07/05 0800) Resp:  [13-24] 14 (07/05 0800) BP: (67-106)/(46-68) 101/68 (07/05 0800) SpO2:  [94 %-100 %] 98 % (07/05 0800)    Intake/Output from previous day: 07/04 0701 - 07/05 0700 In: 801.5 [I.V.:801.5] Out: 370 [Urine:370] Intake/Output this shift: Total I/O In: 99.3 [I.V.:99.3] Out: 10 [Urine:10]  General appearance: no distress Back: large lower back and upper gluteal hematoma evolving, stable in size, expected ecchymoses, stage 1 sacral WD, no cellulitis Neuro: confused but F/C  Lab Results: CBC  Recent Labs    02/09/22 0257 02/10/22 0308  WBC 8.6 7.1  HGB 9.3* 8.1*  HCT 27.6* 24.2*  PLT 125* 115*   BMET Recent Labs    02/09/22 0257 02/10/22 0308  NA 142 142  K 4.5 4.4  CL 108 107  CO2 23 21*  GLUCOSE 87 76  BUN 28* 33*  CREATININE 0.85 0.98  CALCIUM 8.8* 8.9   PT/INR No results for input(s): "LABPROT", "INR" in the last 72 hours. ABG No results for input(s): "PHART", "HCO3" in the last 72 hours.  Invalid input(s): "PCO2", "PO2"  Studies/Results:  Anti-infectives: Anti-infectives (From admission, onward)    Start     Dose/Rate Route Frequency Ordered Stop   02/08/22 1500  ceFEPIme (MAXIPIME) 2 g in sodium chloride 0.9 % 100 mL IVPB  Status:  Discontinued        2 g 200 mL/hr over 30 Minutes Intravenous  Once 02/08/22 1458 02/08/22 1727   02/08/22 1500  vancomycin (VANCOREADY) IVPB 1250 mg/250 mL  Status:  Discontinued        1,250 mg 166.7 mL/hr over 90 Minutes Intravenous  Once 02/08/22 1458 02/08/22 1727       Assessment/Plan: 88 yo male s/p fall at home on Eliquis for a fib . Received Kcentra and PRBCs. - L flank hematoma: stable evolution,  Eliquis on hold. Trend hgb/hct until stable. Continueabdominal binder.  - Continue holding anticoagulation - Remainder of care per CCM>>TRH - we will follow   LOS: 2 days    Georganna Skeans, MD, MPH, FACS Trauma & General Surgery Use [email or web address] to contact on call provider  02/10/2022

## 2022-02-10 NOTE — Progress Notes (Signed)
PROGRESS NOTE    Gene Hunter  JME:[id number] DOB: 1925/12/24 DOA: 02/08/2022 PCP: [name], MD    Chief Complaint  Patient presents with   level 2 fall    Brief Narrative:  88 y/o M with dementia, DNR as of 2020  on eliquis for atrial fibrillation, HTN, Hyperlipidemia ,Admitted 02/08/2022 after  fall down 46f flight of stairs. Found down with Multiple abrasions. Hypotensive, hypothermic. Hgb 7's, WBC is 5.6, Platelets are 113,  got 2L IVF, on 1st unit of blood. FOBT neg, FAST neg. Pan imaging  revealed Mild pulmonary edema with bilateral trace to small volume pleural Effusions, A 20 x 4.5 x 20 cm bilateral lower back soft tissue hematoma formation. Acute L5 compression and L1 complete burst fracture. L1 vertebral body with at least 45% vertebral body height loss and 2 mm retropulsion into the central canal..  CTH and CT C-spine negative for acute traumatic injury. Per family, patient has been hallucinating and talking to his deceased wife for the last 5 days. Per ED notes, there is concern for sepsis, patient has been started on Vanc and Cefepime, which has subsequently been discontinued, as he is afebrile and WBC is normal. In the ED CK was 234. Troponin was 105, BUN 25, Creatinine 0.80, Total Protein 4.8, Albumin 2.7.  Lactate 1.7   PCCM have been called to admit for hypotension and manage care.    Assessment & Plan:   Principal Problem:   Fall Active Problems:   Pressure injury of skin   Gross hematuria   Atrial fibrillation, chronic (HCC)   Hypothermia   Acute blood loss anemia   Acute urinary retention   Dysphagia   Acute metabolic encephalopathy   Compression fracture of L1 lumbar vertebra (HCC)  #1 traumatic fall on blood thinners/lower back hematoma -Patient noted to have traumatic fall on blood thinners noted to have a lower back hematoma with anemia hemoglobin as low as 7.1. -Patient noted to have been on Eliquis on admission and [facility] -[facility] head CT  C-spine negative for any acute traumatic injury. -Patient's blood pressure responded with transfusion per PCCM.  Anticoagulation on hold. -Hemoglobin 8.1 this morning. -Trauma surgery following. -Appreciate trauma surgery input and recommendations.  2.  Acute blood loss anemia/anemia -Patient noted to have traumatic fall with lower back hematoma hemoglobin as low as 7.1. -Anemia panel consistent with iron deficiency anemia/anemia of chronic disease. -Anticoagulation on hold. -Follow H&H. -Transfusion threshold hemoglobin < 7.   3.  A-fib on Eliquis  -Status post Kcentra 02/08/2022. -Continue to hold anticoagulation and will defer resumption to trauma surgery.  4.  Hypothermia/rule out sepsis -Patient pancultured. -Urinalysis ordered and pending. -Check urine cultures. -Currently afebrile, hold antibiotics for now pending culture results.  5.  Hypothyroidism -Synthroid resumed. -Outpatient follow-up.  6.  Urinary retention/hematuria -Patient noted with urinary retention, Foley catheter placed which was likely traumatic as patient now with a hematuria. -Urine cultures pending. -Irrigate Foley catheter every 4 hours. -If no improvement with hematuria or worsening would likely need urology input. -Follow.  7.  BPH -Continue Alfuzosin.  8.?  Dementia/?  Ability to live alone -SW/PT/OT consults placed. -Consult with palliative care for goals of care.  9.  Acute metabolic encephalopathy -Per family patient noted to have some hallucinations and talking to his deceased wife for 5 days prior to admission. -Concerns for possible dementia. -Urinalysis pending to rule out infectious etiology. -TSH at 9.960 patient resumed back on Synthroid. -Folate level of 13.3. -Vitamin B12  at 1387. -Supportive care.  10.  Dysphagia -Patient seen by SLP, patient for MBS tomorrow. -Currently n.p.o. pending MBS tomorrow  11.  Acute compression fractures of L1 and L5 -Noted on scans on  presentation with 45% vertebral height loss of L1 and some retropulsion into the canal. -Patient seen in consultation by neurosurgery who stated given his age and risk of surgery do not think patient is a surgical candidate at this time and recommending LSO brace when ambulating out of bed with outpatient follow-up with neurosurgery.  12.  Pressure injury, POA Pressure Injury 02/08/22 Buttocks Right;Left Stage 1 -  Intact skin with non-blanchable redness of a localized area usually over a bony prominence. (Active)  02/08/22 2100  Location: Buttocks  Location Orientation: Right;Left  Staging: Stage 1 -  Intact skin with non-blanchable redness of a localized area usually over a bony prominence.  Wound Description (Comments):   Present on Admission: Yes        DVT prophylaxis: SCDs Code Status: DNR Family Communication: Updated daughter at bedside Disposition: TBD  Status is: Inpatient Remains inpatient appropriate because: Severity of illness   Consultants:  General surgery: Dr. Georganna Skeans 02/08/2022 Neurosurgery: Dr. Ronnald Ramp 02/08/2022 PCCM admission  Procedures:  CT head/CT maxillofacial/CT C-spine 02/08/2022 CT chest abdomen and pelvis 02/08/2022 Chest x-ray 02/08/2022 Plain films of the pelvis 02/08/2022 2D echo 02/09/2022  Antimicrobials:  IV cefepime 02/08/2022 x 1 dose.   Subjective: \Sitting up in bed.  States has felt better but several years ago.  No chest pain.  No shortness of breath.  No abdominal pain.  Just received medications with foods.  Daughter at bedside stating mental status is not his baseline is as follows past several weeks patient has been seen and talking to his dead wife.  Foley catheter with hematuria noted.  Objective: Vitals:   02/10/22 1000 02/10/22 1153 02/10/22 1200 02/10/22 1436  BP:   106/60 96/65  Pulse: 84  88 77  Resp: '16  20 20  '$ Temp:  99 F (37.2 C)  (!) 97.4 F (36.3 C)  TempSrc:  Axillary  Oral  SpO2: 98%  94% 99%  Weight:    67.8 kg   Height:        Intake/Output Summary (Last 24 hours) at 02/10/2022 1611 Last data filed at 02/10/2022 1300 Gross per 24 hour  Intake 1043.2 ml  Output 505 ml  Net 538.2 ml   Filed Weights   02/08/22 2032 02/09/22 0500 02/10/22 1436  Weight: 68.1 kg 68.1 kg 67.8 kg    Examination:  General exam: Appears calm and comfortable  Respiratory system: Clear to auscultation anterior lung fields.  No wheezes, no crackles, no rhonchi. Respiratory effort normal. Cardiovascular system: Irregularly irregular.  No JVD, murmurs, rubs, gallops or clicks. No pedal edema. Gastrointestinal system: Abdomen is nondistended, soft and nontender. No organomegaly or masses felt. Normal bowel sounds heard.  On abdominal binder. Central nervous system: Alert and oriented. No focal neurological deficits. Extremities: Symmetric 5 x 5 power. Skin: Ecchymosis noted on lower back and sacral region. Psychiatry: Judgement and insight appear normal. Mood & affect appropriate.     Data Reviewed: I have personally reviewed following labs and imaging studies  CBC: Recent Labs  Lab 02/08/22 1352 02/08/22 1401 02/09/22 0257 02/10/22 0308  WBC 5.6  --  8.6 7.1  NEUTROABS 4.4  --   --   --   HGB 7.6* 7.1* 9.3* 8.1*  HCT 24.0* 21.0* 27.6* 24.2*  MCV 98.0  --  88.5 88.6  PLT 113*  --  125* 115*    Basic Metabolic Panel: Recent Labs  Lab 02/08/22 1352 02/08/22 1401 02/09/22 0257 02/10/22 0308  NA 141 140 142 142  K 4.8 4.8 4.5 4.4  CL 107 105 108 107  CO2 26  --  23 21*  GLUCOSE 130* 126* 87 76  BUN 26* 25* 28* 33*  CREATININE 0.93 0.80 0.85 0.98  CALCIUM 8.9  --  8.8* 8.9    GFR: Estimated Creatinine Clearance: 45.2 mL/min (by C-G formula based on SCr of 0.98 mg/dL).  Liver Function Tests: Recent Labs  Lab 02/08/22 1352  AST 17  ALT 16  ALKPHOS 76  BILITOT 1.0  PROT 4.8*  ALBUMIN 2.7*    CBG: No results for input(s): "GLUCAP" in the last 168 hours.   Recent Results (from the past  240 hour(s))  MRSA Next Gen by PCR, Nasal     Status: None   Collection Time: 02/08/22  8:46 PM   Specimen: Nasal Mucosa; Nasal Swab  Result Value Ref Range Status   MRSA by PCR Next Gen NOT DETECTED NOT DETECTED Final    Comment: (NOTE) The GeneXpert MRSA Assay (FDA approved for NASAL specimens only), is one component of a comprehensive MRSA colonization surveillance program. It is not intended to diagnose MRSA infection nor to guide or monitor treatment for MRSA infections. Test performance is not FDA approved in patients less than 12 years old. Performed at [facility], [place] [address], [place], [place] [address]   Culture, blood (routine x 2)     Status: None (Preliminary result)   Collection Time: 02/08/22  9:12 PM   Specimen: BLOOD  Result Value Ref Range Status   Specimen Description BLOOD RIGHT ANTECUBITAL  Final   Special Requests   Final    BOTTLES DRAWN AEROBIC AND ANAEROBIC Blood Culture adequate volume   Culture   Final    NO GROWTH 2 DAYS Performed at [facility], [place] [address], [place], [place] [address]    Report Status PENDING  Incomplete  Culture, blood (routine x 2)     Status: None (Preliminary result)   Collection Time: 02/08/22  9:12 PM   Specimen: BLOOD  Result Value Ref Range Status   Specimen Description BLOOD BLOOD RIGHT HAND  Final   Special Requests   Final    BOTTLES DRAWN AEROBIC AND ANAEROBIC Blood Culture results may not be optimal due to an inadequate volume of blood received in culture bottles   Culture   Final    NO GROWTH 2 DAYS Performed at [facility], [place] [address] [place], [place] [address]    Report Status PENDING  Incomplete         Radiology Studies: ECHOCARDIOGRAM COMPLETE  Result Date: 02/09/2022    ECHOCARDIOGRAM REPORT   Patient Name:   Gene Hunter Date of Exam: 02/09/2022 Medical Rec #:  [id number]      Height:       72.0 in Accession #:    [id number]     Weight:       150.1 lb Date of Birth:   [birth year]     BSA:          1.886 m Patient Age:    50 years       BP:           88/58 mmHg Patient Gender: M  HR:           79 bpm. Exam Location:  Inpatient Procedure: 2D Echo, Color Doppler and Cardiac Doppler Indications:    elevated troponin  History:        Patient has prior history of Echocardiogram examinations, most                 recent 10/09/2018. Risk Factors:Hypertension and Dyslipidemia.  Sonographer:    Melissa Morford RDCS (AE, PE) Referring Phys: [place]  1. Left ventricular ejection fraction, by estimation, is 50 to 55%. The left ventricle has low normal function. The left ventricle has no regional wall motion abnormalities. There is moderate left ventricular hypertrophy. Left ventricular diastolic parameters are indeterminate.  2. Right ventricular systolic function is normal. The right ventricular size is mildly enlarged.  3. The mitral valve is normal in structure. Trivial mitral valve regurgitation. No evidence of mitral stenosis.  4. The aortic valve is normal in structure. There is moderate calcification of the aortic valve. There is moderate thickening of the aortic valve. Aortic valve regurgitation is not visualized. Aortic valve sclerosis/calcification is present, without any  evidence of aortic stenosis.  5. The inferior vena cava is normal in size with greater than 50% respiratory variability, suggesting right atrial pressure of 3 mmHg. FINDINGS  Left Ventricle: Left ventricular ejection fraction, by estimation, is 50 to 55%. The left ventricle has low normal function. The left ventricle has no regional wall motion abnormalities. The left ventricular internal cavity size was normal in size. There is moderate left ventricular hypertrophy. Left ventricular diastolic parameters are indeterminate. Right Ventricle: The right ventricular size is mildly enlarged. No increase in right ventricular wall thickness. Right ventricular systolic function is  normal. Left Atrium: Left atrial size was normal in size. Right Atrium: Right atrial size was normal in size. Pericardium: There is no evidence of pericardial effusion. Mitral Valve: The mitral valve is normal in structure. Trivial mitral valve regurgitation. No evidence of mitral valve stenosis. Tricuspid Valve: The tricuspid valve is normal in structure. Tricuspid valve regurgitation is not demonstrated. No evidence of tricuspid stenosis. Aortic Valve: The aortic valve is normal in structure. There is moderate calcification of the aortic valve. There is moderate thickening of the aortic valve. Aortic valve regurgitation is not visualized. Aortic valve sclerosis/calcification is present, without any evidence of aortic stenosis. Aortic valve mean gradient measures 9.5 mmHg. Aortic valve peak gradient measures 13.5 mmHg. Aortic valve area, by VTI measures 1.32 cm. Pulmonic Valve: The pulmonic valve was normal in structure. Pulmonic valve regurgitation is trivial. No evidence of pulmonic stenosis. Aorta: The aortic root is normal in size and structure. Venous: The inferior vena cava is normal in size with greater than 50% respiratory variability, suggesting right atrial pressure of 3 mmHg. IAS/Shunts: No atrial level shunt detected by color flow Doppler.  LEFT VENTRICLE PLAX 2D LVIDd:         3.90 cm LVIDs:         3.10 cm LV PW:         1.60 cm LV IVS:        1.50 cm LVOT diam:     2.10 cm LV SV:         52 LV SV Index:   27 LVOT Area:     3.46 cm  RIGHT VENTRICLE TAPSE (M-mode): 1.0 cm LEFT ATRIUM             Index  RIGHT ATRIUM           Index LA diam:        3.60 cm 1.91 cm/m   RA Area:     24.40 cm LA Vol (A2C):   49.8 ml 26.40 ml/m  RA Volume:   84.10 ml  44.59 ml/m LA Vol (A4C):   67.9 ml 36.00 ml/m LA Biplane Vol: 58.3 ml 30.91 ml/m  AORTIC VALVE AV Area (Vmax):    1.34 cm AV Area (Vmean):   1.25 cm AV Area (VTI):     1.32 cm AV Vmax:           183.50 cm/s AV Vmean:          148.000 cm/s AV  VTI:            0.390 m AV Peak Grad:      13.5 mmHg AV Mean Grad:      9.5 mmHg LVOT Vmax:         71.10 cm/s LVOT Vmean:        53.200 cm/s LVOT VTI:          0.149 m LVOT/AV VTI ratio: 0.38  AORTA Ao Root diam: 3.25 cm Ao Asc diam:  3.50 cm MITRAL VALVE               TRICUSPID VALVE MV Area (PHT): 5.13 cm    TR Peak grad:   18.3 mmHg MV Decel Time: 148 msec    TR Vmax:        214.00 cm/s MV E velocity: 99.40 cm/s                            SHUNTS                            Systemic VTI:  0.15 m                            Systemic Diam: 2.10 cm Candee Furbish MD Electronically signed by Candee Furbish MD Signature Date/Time: 02/09/2022/1:25:59 PM    Final         Scheduled Meds:  alfuzosin  10 mg Oral Q breakfast   atorvastatin  20 mg Oral Daily   Chlorhexidine Gluconate Cloth  6 each Topical Daily   levothyroxine  150 mcg Oral Q0600   mouth rinse  15 mL Mouth Rinse 4 times per day   Continuous Infusions:  lactated ringers 50 mL/hr at 02/10/22 1300     LOS: 2 days    Time spent: 40 minutes    Irine Seal, MD [facility]   To contact the attending provider between 7A-7P or the covering provider during after hours 7P-7A, please log into the web site [email or web address] and access using universal [place] password for that web site. If you do not have the password, please call the hospital operator.  02/10/2022, 4:11 PM

## 2022-02-10 NOTE — Evaluation (Signed)
Physical Therapy Evaluation Patient Details Name: HAMID BROOKENS MRN: [id number] DOB: September 21, 1925 Today's Date: 02/10/2022  History of Present Illness  88 yo male presented 02/08/22 s/p fall at home, found down by a family member. Work up demonstrated acute L5 compression and L1 complete burst fracture, L1 vertebral body with at least 45% vertebral body height loss and 2 mm  retropulsion into the central canal. PMHx: blind R eye, BPH, EH, HLD, hypothyroidism   Clinical Impression  Pt presents with condition above and deficits mentioned below, see PT Problem List. PTA, he was living alone in a house with a ramped entrance option and a basement with the laundry area down there. Pt was having only some STM deficit symptoms from his dementia until about a month ago, when he began to decline and need more cues to remain on task/sequence ADLs and he began to hallucinate his deceased wife. Pt was mod I intermittently using his cane for mobility, but recently transitioned to using a RW. Currently, pt is demonstrating deficits in cognition, balance, strength, and activity tolerance that place him at high risk for subsequent falls. Pt only oriented to self today with poor insight into his deficits or safety concerns, needing his legs blocked and posterior trunk support to sit statically EOB as he continued to extend his legs and lean posteriorly and slide on EOB. Pt required modAx2 to roll and max-TA x2 for transitioning to sit or back to supine. Deferred OOB mobility due to poor safety awareness and his strong posterior bias. Daughter reports she can stay with him for a week every other week and then daughter-in-law and another person could likely stay with him throughout the other week, but the daughter is unsure of the amount of physical assistance they can provide safely. Thus, currently recommending rehab at a SNF pending pt's progress with mobility and his family's physical capabilities to assist him. As pt has  dementia, who would do better in his own home as it is his familiar environment. Therefore, hoping pt can progress to a manageable level for him to go home with HHPT and max HH services and family support. Will continue to follow acutely.        Recommendations for follow up therapy are one component of a multi-disciplinary discharge planning process, led by the attending physician.  Recommendations may be updated based on patient status, additional functional criteria and insurance authorization.  Follow Up Recommendations Skilled nursing-short term rehab (<3 hours/day) (vs HHPT with max HH services pending pt progress and capabilities of caregivers; prefer to go home if they can manage him) Can patient physically be transported by private vehicle: No    Assistance Recommended at Discharge Frequent or constant Supervision/Assistance  Patient can return home with the following  Two people to help with walking and/or transfers;A lot of help with bathing/dressing/bathroom;Assistance with cooking/housework;Direct supervision/assist for medications management;Direct supervision/assist for financial management;Assist for transportation;Help with stairs or ramp for entrance    Equipment Recommendations Hospital bed;Wheelchair (measurements PT);Wheelchair cushion (measurements PT);Other (comment) (lift equipment; pending progress)  Recommendations for Other Services       Functional Status Assessment Patient has had a recent decline in their functional status and demonstrates the ability to make significant improvements in function in a reasonable and predictable amount of time.     Precautions / Restrictions Precautions Precautions: Fall;Back Precaution Booklet Issued: No Precaution Comments: posey belt, bilat mitts, dementia at baseline Required Braces or Orthoses: Other Brace;Spinal Brace Spinal Brace: Lumbar corset;Applied in sitting  position Other Brace: abdominal  binder Restrictions Weight Bearing Restrictions: No      Mobility  Bed Mobility Overal bed mobility: Needs Assistance Bed Mobility: Rolling, Sidelying to Sit, Sit to Supine Rolling: Mod assist, +2 for physical assistance Sidelying to sit: Max assist, +2 for physical assistance, HOB elevated, +2 for safety/equipment   Sit to supine: Total assist, +2 for physical assistance, +2 for safety/equipment   General bed mobility comments: Cues to flex L knee and grab therapist's hand with L UE to pull to roll to R, modAx2 to complete. MaxAx2 to manage legs off EOB and ascent trunk with pt quickly extending legs on floor and pushing trunk posteriorly, needing blocking to stop and prevent pt from sliding off EOB. TAx2 to return to supine.    Transfers                   General transfer comment: deferred due to posterior lean, sliding on EOB, and impaired cognition    Ambulation/Gait               General Gait Details: deferred due to posterior lean, sliding on EOB, and impaired cognition  Stairs            Wheelchair Mobility    Modified Rankin (Stroke Patients Only)       Balance Overall balance assessment: Needs assistance Sitting-balance support: Bilateral upper extremity supported, Feet supported, No upper extremity supported Sitting balance-Leahy Scale: Poor Sitting balance - Comments: Pt with posterior lean and pushing feet anteriorly on floor into leg extension, needing feet blocking and posterior trunk support to prevent pt from sliding off EOB, mod-TA to sit EOB for ~5 min duration. Postural control: Posterior lean     Standing balance comment: deferred for pt safety                             Pertinent Vitals/Pain Pain Assessment Pain Assessment: Faces Faces Pain Scale: Hurts even more Pain Location: LUE with PROM and generalized with bed mobility Pain Descriptors / Indicators: Grimacing, Discomfort Pain Intervention(s): Limited  activity within patient's tolerance, Monitored during session, Repositioned    Home Living Family/patient expects to be discharged to:: Private residence Living Arrangements: Alone Available Help at Discharge: Family;Available 24 hours/day Type of Home: House Home Access: Ramped entrance (4 stairs on back with rails on both sides, pt prefers the stairs)       Home Layout: One level;Laundry or work area in basement [facility] in basement, had not been down in basement for months) Home Equipment: International aid/development worker (2 wheels);Rollator (4 wheels);Hand held shower head;Cane - quad;Cane - single point (ordered 3in1, waiting on it to arrive; lift recliner)      Prior Function Prior Level of Function : Needs assist  Cognitive Assist : ADLs (cognitive)     Physical Assist : ADLs (physical)     Mobility Comments: Mod I using SPC, sometimes carrying it. Had started using RW few weeks PTA. Hx of falls, unsure of frequency. ADLs Comments: Had some STM deficits from dementia at baseline, but began hallucinating his wife over the prior weekend. Sister-in-law lives nearby and manages his meds and finances. Was able to perform ADLs without assistance as of up to ~1-2 weeks PTA, and then began needing cues to remain on task. Daughter comes home every other week and stays with pt for a week to give daughter-in-law a break. They help with cleaning, cooking, laundry  etc. Daughter-in-law checks on hims 2x/day, but works. They have another person that could assist also if needed.     Hand Dominance   Dominant Hand: Right    Extremity/Trunk Assessment   Upper Extremity Assessment Upper Extremity Assessment: Defer to OT evaluation RUE Deficits / Details: Moving funcitonally, very stiff. Not follow commands well enough to assess range and strength LUE Deficits / Details: bruising throughout, painful PORM. Using semi funcitonally but overall stiff in nature    Lower Extremity  Assessment Lower Extremity Assessment: Generalized weakness;Difficult to assess due to impaired cognition (not following commands well to formally assess, mopves stiffly and functional weakness noted bil)    Cervical / Trunk Assessment Cervical / Trunk Assessment: Kyphotic (back fx's)  Communication   Communication: No difficulties (nonsensical speach at times)  Cognition Arousal/Alertness: Awake/alert Behavior During Therapy: Anxious Overall Cognitive Status: History of cognitive impairments - at baseline                                 General Comments: oriented to self only - dementia at baseline. Follows some simple cues with increased time. Extreme fear of falling. Little to no insight. Perseverating on being blind throughout. prior to a month ago, pt only had STM deficits with his dementia but this has worsened recently with him needing more cues for ADLs and hallucinating his wife        General Comments      Exercises     Assessment/Plan    PT Assessment Patient needs continued PT services  PT Problem List Decreased strength;Decreased activity tolerance;Decreased balance;Decreased range of motion;Decreased mobility;Decreased cognition;Decreased safety awareness;Decreased knowledge of precautions;Pain;Decreased skin integrity       PT Treatment Interventions DME instruction;Gait training;Functional mobility training;Therapeutic activities;Therapeutic exercise;Balance training;Neuromuscular re-education;Cognitive remediation;Patient/family education    PT Goals (Current goals can be found in the Care Plan section)  Acute Rehab PT Goals Patient Stated Goal: to go home PT Goal Formulation: With patient/family Time For Goal Achievement: 02/24/22 Potential to Achieve Goals: Fair    Frequency Min 4X/week     Co-evaluation PT/OT/SLP Co-Evaluation/Treatment: Yes Reason for Co-Treatment: Complexity of the patient's impairments (multi-system  involvement);Necessary to address cognition/behavior during functional activity;For patient/therapist safety;To address functional/ADL transfers PT goals addressed during session: Mobility/safety with mobility;Balance OT goals addressed during session: ADL's and self-care       AM-PAC PT "6 Clicks" Mobility  Outcome Measure Help needed turning from your back to your side while in a flat bed without using bedrails?: Total Help needed moving from lying on your back to sitting on the side of a flat bed without using bedrails?: Total Help needed moving to and from a bed to a chair (including a wheelchair)?: Total Help needed standing up from a chair using your arms (e.g., wheelchair or bedside chair)?: Total Help needed to walk in hospital room?: Total Help needed climbing 3-5 steps with a railing? : Total 6 Click Score: 6    End of Session   Activity Tolerance: Other (comment) (limited by imaired cognition/safety awareness/ability to follow commands) Patient left: in bed;with call bell/phone within reach;with bed alarm set;with restraints reapplied Nurse Communication: Mobility status PT Visit Diagnosis: Muscle weakness (generalized) (M62.81);Repeated falls (R29.6);History of falling (Z91.81);Difficulty in walking, not elsewhere classified (R26.2);Pain Pain - Right/Left: Left Pain - part of body: Arm (back)    Time: 8921-1941 PT Time Calculation (min) (ACUTE ONLY): 26 min   Charges:  PT Evaluation $PT Eval Moderate Complexity: 1 Mod          Moishe Spice, PT, DPT Acute Rehabilitation Services  Office: [phone]   Orvan Falconer 02/10/2022, 11:41 AM

## 2022-02-10 NOTE — Evaluation (Signed)
Occupational Therapy Evaluation Patient Details Name: Gene Hunter MRN: [id number] DOB: 09/21/1925 Today's Date: 02/10/2022   History of Present Illness 88 yo male presented 02/08/22 s/p fall at home, found down by a family member. Work up demonstrated acute L5 compression and L1 complete burst fracture, L1 vertebral body with at least 45% vertebral body height loss and 2 mm  retropulsion into the central canal. PMHx: blind R eye, BPH, EH, HLD, hypothyroidism   Clinical Impression   Gene Hunter was evaluated s/p the above admission list, he lives alone and is generally mod I at baseline with report of multiple falls. Limited history obtained due to pts confusion. Overall he required mod-max A +2 for bed mobility with poor sitting balance due to posterior pushing and extension at the LEs. He has an extreme far of falling, and anxious the entire session; unable to progress beyond EOB. Due to deficits listed blow, he requires total A for LB Adls and mod A for UB ADLs. He will ebonite from OT acutely. [name] d/c will be pending his physically progress with hopes he can progress to a min-mod A so family can safely take care of him at home.    Recommendations for follow up therapy are one component of a multi-disciplinary discharge planning process, led by the attending physician.  Recommendations may be updated based on patient status, additional functional criteria and insurance authorization.   Follow Up Recommendations  Skilled nursing-short term rehab (<3 hours/day) (Pending pt progress - family prefers d/c home if he can get to a safe assist level for them to handle him)    Assistance Recommended at Discharge Frequent or constant Supervision/Assistance  Patient can return home with the following Two people to help with walking and/or transfers;Two people to help with bathing/dressing/bathroom;Assistance with cooking/housework;Assistance with feeding;Direct supervision/assist for medications  management;Direct supervision/assist for financial management;Assist for transportation;Help with stairs or ramp for entrance    Functional Status Assessment  Patient has had a recent decline in their functional status and/or demonstrates limited ability to make significant improvements in function in a reasonable and predictable amount of time  Equipment Recommendations  Other (comment) (pending d/c decision)    Recommendations for Other Services       Precautions / Restrictions Precautions Precautions: Fall;Back Precaution Booklet Issued: No Precaution Comments: posey belt, bilat mitts, dementia at baseline Required Braces or Orthoses: Other Brace;Spinal Brace Spinal Brace: Lumbar corset;Applied in sitting position Other Brace: abdominal binder Restrictions Weight Bearing Restrictions: No      Mobility Bed Mobility Overal bed mobility: Needs Assistance Bed Mobility: Rolling, Sidelying to Sit, Sit to Supine Rolling: Mod assist, +2 for physical assistance Sidelying to sit: Max assist, +2 for physical assistance, HOB elevated, +2 for safety/equipment   Sit to supine: Total assist, +2 for physical assistance, +2 for safety/equipment   General bed mobility comments: Cues to flex L knee and grab therapist's hand with L UE to pull to roll to R, modAx2 to complete. MaxAx2 to manage legs off EOB and ascent trunk with pt quickly extending legs on floor and pushing trunk posteriorly, needing blocking to stop and prevent pt from sliding off EOB. TAx2 to return to supine.    Transfers                   General transfer comment: deferred due to posterior lean, sliding on EOB, and impaired cognition      Balance Overall balance assessment: Needs assistance Sitting-balance support: Bilateral upper extremity supported, Feet supported,  No upper extremity supported Sitting balance-Leahy Scale: Poor Sitting balance - Comments: Pt with posterior lean and pushing feet anteriorly on  floor into leg extension, needing feet blocking and posterior trunk support to prevent pt from sliding off EOB, mod-TA to sit EOB for ~5 min duration. Postural control: Posterior lean     Standing balance comment: deferred for pt safety                           ADL either performed or assessed with clinical judgement   ADL Overall ADL's : Needs assistance/impaired Eating/Feeding: NPO   Grooming: Maximal assistance;Bed level   Upper Body Bathing: Maximal assistance;Sitting   Lower Body Bathing: +2 for safety/equipment;+2 for physical assistance;Total assistance;Bed level   Upper Body Dressing : Maximal assistance;Sitting   Lower Body Dressing: Total assistance;+2 for physical assistance;+2 for safety/equipment;Bed level   Toilet Transfer: Total assistance   Toileting- Clothing Manipulation and Hygiene: Total assistance;+2 for physical assistance;+2 for safety/equipment;Bed level       Functional mobility during ADLs: +2 for physical assistance;Total assistance;+2 for safety/equipment General ADL Comments: extreme fear of fall and confusion. globally weak and stiff.     Vision Baseline Vision/History: 2 Legally blind Vision Assessment?: Vision impaired- to be further tested in functional context Additional Comments: R eye blind, pt perseverating on being blind throughout. Opened his eyes a few times     Perception     Praxis      Pertinent Vitals/Pain Pain Assessment Pain Assessment: Faces Faces Pain Scale: Hurts even more Pain Location: LUE with PROM and generalized with bed mobility Pain Descriptors / Indicators: Grimacing, Discomfort Pain Intervention(s): Limited activity within patient's tolerance, Monitored during session     Hand Dominance Right   Extremity/Trunk Assessment Upper Extremity Assessment Upper Extremity Assessment: Defer to OT evaluation RUE Deficits / Details: Moving funcitonally, very stiff. Not follow commands well enough to  assess range and strength LUE Deficits / Details: bruising throughout, painful PORM. Using semi funcitonally but overall stiff in nature   Lower Extremity Assessment Lower Extremity Assessment: Defer to PT evaluation   Cervical / Trunk Assessment Cervical / Trunk Assessment: Kyphotic;Other exceptions Cervical / Trunk Exceptions: back fx's   Communication Communication Communication: No difficulties   Cognition Arousal/Alertness: Awake/alert Behavior During Therapy: Anxious Overall Cognitive Status: History of cognitive impairments - at baseline                                 General Comments: oriented to self only - dementia at baseline. Follows some simple cues with increased time. Extreme fear of falling. Little to no insight. Perseverating on being blind throughout. prior to a month ago, pt only had STM deficits with his dementia but this has worsened recently with him needing more cues for ADLs and hallucinating his wife     General Comments  VSS on RA, abdominal binder donned    Exercises     Shoulder Instructions      Home Living Family/patient expects to be discharged to:: Private residence Living Arrangements: Alone Available Help at Discharge: Family;Available 24 hours/day Type of Home: House Home Access: Ramped entrance     Home Layout: One level;Laundry or work area in basement     [employer] Shower/Tub: Occupational psychologist: Standard Bathroom Accessibility: Yes   Home Equipment: International aid/development worker (2 wheels);Rollator (4 wheels);Hand held shower head;Cane -  quad;Cane - single point          Prior Functioning/Environment Prior Level of Function : Needs assist  Cognitive Assist : ADLs (cognitive)     Physical Assist : ADLs (physical)     Mobility Comments: Mod I using SPC, sometimes carrying it. Had started using RW few weeks PTA. Hx of falls, unsure of frequency. ADLs Comments: Had some STM deficits from  dementia at baseline, but began hallucinating his wife over the prior weekend. Sister-in-law lives nearby and manages his meds and finances. Was able to perform ADLs without assistance as of up to ~1-2 weeks PTA, and then began needing cues to remain on task. Daughter comes home every other week and stays with pt for a week to give daughter-in-law a break. They help with cleaning, cooking, laundry etc. Daughter-in-law checks on hims 2x/day, but works. They have another person that could assist also if needed.        OT Problem List: Decreased strength;Decreased range of motion;Decreased activity tolerance;Impaired balance (sitting and/or standing);Decreased cognition;Decreased coordination;Decreased safety awareness;Pain      OT Treatment/Interventions: Self-care/ADL training;Therapeutic exercise;DME and/or AE instruction;Therapeutic activities;Patient/family education;Balance training    OT Goals(Current goals can be found in the care plan section) Acute Rehab OT Goals Patient Stated Goal: less pain OT Goal Formulation: Patient unable to participate in goal setting Time For Goal Achievement: 02/24/22 Potential to Achieve Goals: Fair ADL Goals Pt Will Perform Grooming: with min assist;sitting Pt Will Perform Upper Body Dressing: with min assist;sitting Pt Will Perform Lower Body Dressing: with mod assist;sit to/from stand Pt Will Transfer to Toilet: with mod assist;ambulating;bedside commode  OT Frequency: Min 2X/week    Co-evaluation PT/OT/SLP Co-Evaluation/Treatment: Yes Reason for Co-Treatment: Complexity of the patient's impairments (multi-system involvement);For patient/therapist safety;To address functional/ADL transfers PT goals addressed during session: Mobility/safety with mobility;Balance OT goals addressed during session: ADL's and self-care      AM-PAC OT "6 Clicks" Daily Activity     Outcome Measure Help from another person eating meals?: None Help from another person  taking care of personal grooming?: A Lot Help from another person toileting, which includes using toliet, bedpan, or urinal?: Total Help from another person bathing (including washing, rinsing, drying)?: A Lot Help from another person to put on and taking off regular upper body clothing?: A Lot Help from another person to put on and taking off regular lower body clothing?: Total 6 Click Score: 12   End of Session Nurse Communication: Mobility status  Activity Tolerance: Patient tolerated treatment well Patient left: in bed;with call bell/phone within reach;with bed alarm set  OT Visit Diagnosis: Unsteadiness on feet (R26.81);Other abnormalities of gait and mobility (R26.89);Repeated falls (R29.6);Muscle weakness (generalized) (M62.81);History of falling (Z91.81);Pain                Time: 6237-6283 OT Time Calculation (min): 23 min Charges:  OT General Charges $OT Visit: 1 Visit OT Evaluation $OT Eval Moderate Complexity: 1 Mod   Shi Grose A Brennley Curtice 02/10/2022, 12:20 PM

## 2022-02-11 ENCOUNTER — Inpatient Hospital Stay (HOSPITAL_COMMUNITY): Payer: Medicare Other

## 2022-02-11 DIAGNOSIS — R310 Gross hematuria: Secondary | ICD-10-CM | POA: Diagnosis not present

## 2022-02-11 DIAGNOSIS — W19XXXD Unspecified fall, subsequent encounter: Secondary | ICD-10-CM | POA: Diagnosis not present

## 2022-02-11 DIAGNOSIS — D649 Anemia, unspecified: Secondary | ICD-10-CM | POA: Diagnosis not present

## 2022-02-11 DIAGNOSIS — E039 Hypothyroidism, unspecified: Secondary | ICD-10-CM | POA: Diagnosis not present

## 2022-02-11 LAB — BASIC METABOLIC PANEL
Anion gap: 10 (ref 5–15)
BUN: 35 mg/dL — ABNORMAL HIGH (ref 8–23)
CO2: 25 mmol/L (ref 22–32)
Calcium: 9.1 mg/dL (ref 8.9–10.3)
Chloride: 109 mmol/L (ref 98–111)
Creatinine, Ser: 0.93 mg/dL (ref 0.61–1.24)
GFR, Estimated: >60 mL/min (ref >60)
Glucose, Bld: 86 mg/dL (ref 70–99)
Potassium: 4.4 mmol/L (ref 3.5–5.1)
Sodium: 144 mmol/L (ref 135–145)

## 2022-02-11 LAB — URINE CULTURE: Culture: NO GROWTH

## 2022-02-11 LAB — CBC
HCT: 24.7 % — ABNORMAL LOW (ref 39.0–52.0)
Hemoglobin: 8 g/dL — ABNORMAL LOW (ref 13.0–17.0)
MCH: 29.7 pg (ref 26.0–34.0)
MCHC: 32.4 g/dL (ref 30.0–36.0)
MCV: 91.8 fL (ref 80.0–100.0)
Platelets: 128 10*3/uL — ABNORMAL LOW (ref 150–400)
RBC: 2.69 MIL/uL — ABNORMAL LOW (ref 4.22–5.81)
RDW: 18.2 % — ABNORMAL HIGH (ref 11.5–15.5)
WBC: 6.4 10*3/uL (ref 4.0–10.5)
nRBC: 0 % (ref 0.0–0.2)

## 2022-02-11 LAB — CK: Total CK: 248 U/L (ref 49–397)

## 2022-02-11 MED ORDER — LEVOTHYROXINE SODIUM 100 MCG/5ML IV SOLN: 75.0000 ug | Freq: Every day | INTRAVENOUS | Status: DC

## 2022-02-11 MED ORDER — KETOROLAC TROMETHAMINE 30 MG/ML IJ SOLN
15.0000 mg | Freq: Four times a day (QID) | INTRAMUSCULAR | Status: DC | PRN
  Administered 2022-02-11: 15 mg via INTRAVENOUS
  Filled 2022-02-11: qty 1

## 2022-02-11 MED ORDER — LEVOTHYROXINE SODIUM 100 MCG/5ML IV SOLN: 100.0000 ug | Freq: Every day | INTRAVENOUS | Status: DC

## 2022-02-11 MED ORDER — OLANZAPINE 5 MG PO TBDP
2.5000 mg | ORAL_TABLET | Freq: Every day | ORAL | Status: DC
  Filled 2022-02-11 (×3): qty 0.5

## 2022-02-11 MED ORDER — ACETAMINOPHEN 325 MG PO TABS
650.0000 mg | ORAL_TABLET | ORAL | Status: DC | PRN
  Administered 2022-02-11: 650 mg via ORAL
  Filled 2022-02-11: qty 2

## 2022-02-11 NOTE — Progress Notes (Signed)
PROGRESS NOTE    Gene Hunter  NTI:[id number] DOB: 1926-01-20 DOA: 02/08/2022 PCP: [name], MD    Chief Complaint  Patient presents with   level 2 fall    Brief Narrative:  88 y/o M with dementia, DNR as of 2020  on eliquis for atrial fibrillation, HTN, Hyperlipidemia ,Admitted 02/08/2022 after  fall down 25f flight of stairs. Found down with Multiple abrasions. Hypotensive, hypothermic. Hgb 7's, WBC is 5.6, Platelets are 113,  got 2L IVF, on 1st unit of blood. FOBT neg, FAST neg. Pan imaging  revealed Mild pulmonary edema with bilateral trace to small volume pleural Effusions, A 20 x 4.5 x 20 cm bilateral lower back soft tissue hematoma formation. Acute L5 compression and L1 complete burst fracture. L1 vertebral body with at least 45% vertebral body height loss and 2 mm retropulsion into the central canal..  CTH and CT C-spine negative for acute traumatic injury. Per family, patient has been hallucinating and talking to his deceased wife for the last 5 days. Per ED notes, there is concern for sepsis, patient has been started on Vanc and Cefepime, which has subsequently been discontinued, as he is afebrile and WBC is normal. In the ED CK was 234. Troponin was 105, BUN 25, Creatinine 0.80, Total Protein 4.8, Albumin 2.7.  Lactate 1.7   PCCM have been called to admit for hypotension and manage care.    Assessment & Plan:   Principal Problem:   Fall Active Problems:   Pressure injury of skin   Gross hematuria   Atrial fibrillation, chronic (HCC)   Hypothermia   Acute blood loss anemia   Acute urinary retention   Dysphagia   Acute metabolic encephalopathy   Compression fracture of L1 lumbar vertebra (HCC)  #1 traumatic fall on blood thinners/lower back hematoma -Patient noted to have traumatic fall on blood thinners noted to have a lower back hematoma with anemia hemoglobin as low as 7.1. -Patient noted to have been on Eli[facility]  C-spine negative for any acute traumatic injury. -Patient's blood pressure responded with transfusion per PCCM.  Anticoagulation on hold. -Hemoglobin 8.0 this morning. -Trauma surgery following. -Appreciate trauma surgery input and recommendations.  2.  Acute blood loss anemia/anemia -Patient noted to have traumatic fall with lower back hematoma hemoglobin as low as 7.1. -Anemia panel consistent with iron deficiency anemia/anemia of chronic disease. -Hemoglobin currently at 8.0 and seems to have stabilized. -Anticoagulation on hold. -Follow H&H. -Transfusion threshold hemoglobin < 7.   3.  A-fib on Eliquis  -Status post Kcentra 02/08/2022. -Due to patient's dementia, recent fall, will hold off on resuming anticoagulation and defer to the outpatient setting to determine whether anticoagulation is still needed.  4.  Hypothermia/rule out sepsis -Patient pancultured. -Urinalysis ordered and urine cultures with no growth to date. -Currently afebrile, hold antibiotics for now pending culture results.  5.  Hypothyroidism -Synthroid resumed. -Outpatient follow-up.  6.  Urinary retention/hematuria -Patient noted with urinary retention, Foley catheter placed which was likely traumatic as patient noted to have significant hematuria.   -Hematuria cleared with Foley catheter irrigation.   -Urine cultures negative.   -Decrease Foley irrigation to daily and- The next 24 hours we will discontinue Foley catheter irrigation.  -Supportive care.   7.  BPH -Continue Alfuzosin.  8.?  Dementia/?  Ability to live alone -SW/PT/OT consults placed. -Patient seems to be deteriorating, with dysphagia, decreased mental status. -Palliative care consultation pending for goals of care.  9.  Acute metabolic encephalopathy -Per family patient noted to have some hallucinations and talking to his deceased wife for 5 days prior to admission. -Patient with history of dementia, likely worsening with some  associated dysphagia. -Urinalysis noted to be turbid, red, WBC  21-50. -Urine cultures with no growth to date. -TSH at 9.960 patient resumed back on Synthroid. -Folate level of 13.3. -Vitamin B12 at 1387. -Supportive care. -Palliative care consultation pending.  10.  Dysphagia -Patient seen by SLP, patient for MBS today.  -Currently n.p.o. pending MBS tomorrow  11.  Acute compression fractures of L1 and L5 -Noted on scans on presentation with 45% vertebral height loss of L1 and some retropulsion into the canal. -Patient seen in consultation by neurosurgery who stated given his age and risk of surgery do not think patient is a surgical candidate at this time and recommending LSO brace when ambulating out of bed with outpatient follow-up with neurosurgery.  12.  Pressure injury, POA Pressure Injury 02/08/22 Buttocks Right;Left Stage 1 -  Intact skin with non-blanchable redness of a localized area usually over a bony prominence. (Active)  02/08/22 2100  Location: Buttocks  Location Orientation: Right;Left  Staging: Stage 1 -  Intact skin with non-blanchable redness of a localized area usually over a bony prominence.  Wound Description (Comments):   Present on Admission: Yes        DVT prophylaxis: SCDs Code Status: DNR Family Communication: Updated daughter at bedside Disposition: TBD  Status is: Inpatient Remains inpatient appropriate because: Severity of illness   Consultants:  General surgery: Dr. Georganna Skeans 02/08/2022 Neurosurgery: Dr. Ronnald Ramp 02/08/2022 PCCM admission Palliative care pending  Procedures:  CT head/CT maxillofacial/CT C-spine 02/08/2022 CT chest abdomen and pelvis 02/08/2022 Chest x-ray 02/08/2022 Plain films of the pelvis 02/08/2022 2D echo 02/09/2022  Antimicrobials:  IV cefepime 02/08/2022 x 1 dose.   Subjective: Patient sleeping, somewhat arousable.  Answer some questions.  Pleasantly confused.  Denies chest pain.  No shortness of breath.  States do not  move me.  Foley catheter clear.  Daughter at bedside.    Objective: Vitals:   02/11/22 0105 02/11/22 0246 02/11/22 0500 02/11/22 0741  BP: 105/62 97/73 (!) 102/57 110/64  Pulse: 82 92 98 79  Resp: 14 (!) '21 18 20  '$ Temp: 99 F (37.2 C) 99 F (37.2 C) 98.5 F (36.9 C) 99.1 F (37.3 C)  TempSrc: Oral Axillary Axillary Axillary  SpO2: 98% 100% 96% 95%  Weight:      Height:        Intake/Output Summary (Last 24 hours) at 02/11/2022 1150 Last data filed at 02/11/2022 0600 Gross per 24 hour  Intake 98.14 ml  Output 400 ml  Net -301.86 ml    Filed Weights   02/08/22 2032 02/09/22 0500 02/10/22 1436  Weight: 68.1 kg 68.1 kg 67.8 kg    Examination:  General exam: Somnolent. Respiratory system: Clear to auscultation anterior lung fields.  No wheezes, no crackles, no rhonchi. Respiratory effort normal. Cardiovascular system: Irregularly irregular.  No JVD, murmurs, rubs, gallops or clicks. No pedal edema. Gastrointestinal system: Abdomen is nondistended, soft and nontender. No organomegaly or masses felt. Normal bowel sounds heard.  On abdominal binder. Central nervous system: Somnolent, drowsy.  Moving extremities spontaneously. Extremities: Symmetric 5 x 5 power. Skin: Ecchymosis noted on lower back and sacral region. Psychiatry: Judgement and insight appear poor. Mood & affect appropriate.     Data Reviewed: I have personally reviewed following labs and imaging studies  CBC: Recent  Labs  Lab 02/08/22 1352 02/08/22 1401 02/09/22 0257 02/10/22 0308 02/11/22 0321  WBC 5.6  --  8.6 7.1 6.4  NEUTROABS 4.4  --   --   --   --   HGB 7.6* 7.1* 9.3* 8.1* 8.0*  HCT 24.0* 21.0* 27.6* 24.2* 24.7*  MCV 98.0  --  88.5 88.6 91.8  PLT 113*  --  125* 115* 128*     Basic Metabolic Panel: Recent Labs  Lab 02/08/22 1352 02/08/22 1401 02/09/22 0257 02/10/22 0308 02/11/22 0321  NA 141 140 142 142 144  K 4.8 4.8 4.5 4.4 4.4  CL 107 105 108 107 109  CO2 26  --  23 21* 25   GLUCOSE 130* 126* 87 76 86  BUN 26* 25* 28* 33* 35*  CREATININE 0.93 0.80 0.85 0.98 0.93  CALCIUM 8.9  --  8.8* 8.9 9.1     GFR: Estimated Creatinine Clearance: 47.6 mL/min (by C-G formula based on SCr of 0.93 mg/dL).  Liver Function Tests: Recent Labs  Lab 02/08/22 1352  AST 17  ALT 16  ALKPHOS 76  BILITOT 1.0  PROT 4.8*  ALBUMIN 2.7*     CBG: No results for input(s): "GLUCAP" in the last 168 hours.   Recent Results (from the past 240 hour(s))  MRSA Next Gen by PCR, Nasal     Status: None   Collection Time: 02/08/22  8:46 PM   Specimen: Nasal Mucosa; Nasal Swab  Result Value Ref Range Status   MRSA by PCR Next Gen NOT DETECTED NOT DETECTED Final    Comment: (NOTE) The GeneXpert MRSA Assay (FDA approved for NASAL specimens only), is one component of a comprehensive MRSA colonization surveillance program. It is not intended to diagnose MRSA infection nor to guide or monitor treatment for MRSA infections. Test performance is not FDA approved in patients less than 74 years old. Performed at [facility], [place] [address] [place], [place] [address]   Culture, blood (routine x 2)     Status: None (Preliminary result)   Collection Time: 02/08/22  9:12 PM   Specimen: BLOOD  Result Value Ref Range Status   Specimen Description BLOOD RIGHT ANTECUBITAL  Final   Special Requests   Final    BOTTLES DRAWN AEROBIC AND ANAEROBIC Blood Culture adequate volume   Culture   Final    NO GROWTH 3 DAYS Performed at [facility], [place] [address], [place], [place] [address]    Report Status PENDING  Incomplete  Culture, blood (routine x 2)     Status: None (Preliminary result)   Collection Time: 02/08/22  9:12 PM   Specimen: BLOOD  Result Value Ref Range Status   Specimen Description BLOOD BLOOD RIGHT HAND  Final   Special Requests   Final    BOTTLES DRAWN AEROBIC AND ANAEROBIC Blood Culture results may not be optimal due to an inadequate volume of blood received in  culture bottles   Culture   Final    NO GROWTH 3 DAYS Performed at [facility], [place] [address] [place], [place] [address]    Report Status PENDING  Incomplete  Urine Culture     Status: None   Collection Time: 02/10/22  8:24 AM   Specimen: Urine, Clean Catch  Result Value Ref Range Status   Specimen Description URINE, CLEAN CATCH  Final   Special Requests NONE  Final   Culture   Final    NO GROWTH Performed at [facility]  [facility], [place] [address] [place], [place] [address]    Report Status 02/11/2022 FINAL  Final         Radiology Studies: No results found.      Scheduled Meds:  alfuzosin  10 mg Oral Q breakfast   atorvastatin  20 mg Oral Daily   Chlorhexidine Gluconate Cloth  6 each Topical Daily   levothyroxine  150 mcg Oral Q0600   OLANZapine zydis  2.5 mg Oral Daily   mouth rinse  15 mL Mouth Rinse 4 times per day   Continuous Infusions:  lactated ringers 50 mL/hr at 02/10/22 1300     LOS: 3 days    Time spent: 40 minutes    Irine Seal, MD [facility]   To contact the attending provider between 7A-7P or the covering provider during after hours 7P-7A, please log into the web site [email or web address] and access using universal [place] password for that web site. If you do not have the password, please call the hospital operator.  02/11/2022, 11:50 AM

## 2022-02-11 NOTE — Progress Notes (Signed)
Speech Language Pathology Treatment: Dysphagia  Patient Details Name: Gene Hunter MRN: [id number] DOB: 1926-07-28 Today's Date: 02/11/2022 Time: 8110-3159 SLP Time Calculation (min) (ACUTE ONLY): 9 min  Assessment / Plan / Recommendation Clinical Impression  Pt was seen for dysphagia treatment with his daughter present. He was adequately alert for p.o. intake, but alertness was reduced compared to yesterday. Pt continues to demonstrate multiple swallows, throat clearing, and coughing with thin liquids and puree. Bolus awareness was reduced and cues were necessary for swallowing purees. Short-term enteral nutrition was briefly discussed with pt's daughter and she indicated that she would not be interested in this as an option stating, "Absolutely not, he will just pull it out." A modified barium swallow study is still recommended and will be coordinated for today pending radiology's schedule.    HPI HPI: Pt is a 88 yo male presenting 7/3 s/p fall at home down a flight of stairs. CTH negative for acute findings; CT CAP w/ L5 compression fx and L1 incomplete burst fx. PMH includes: dementia, blindness of L eye, BPH, HTN, hypothyroidism      SLP Plan  MBS      Recommendations for follow up therapy are one component of a multi-disciplinary discharge planning process, led by the attending physician.  Recommendations may be updated based on patient status, additional functional criteria and insurance authorization.    Recommendations  Diet recommendations: NPO Medication Administration: Crushed with puree (1/2 tsp boluses) Compensations: Minimize environmental distractions;Slow rate;Small sips/bites                Oral Care Recommendations: Oral care QID Follow Up Recommendations: Skilled nursing-short term rehab (<3 hours/day) Assistance recommended at discharge: Frequent or constant Supervision/Assistance SLP Visit Diagnosis: Dysphagia, unspecified (R13.10) Plan: MBS          Ken Bonn I. Hardin Negus, [place], [place] Office number [phone] Pager [place]  02/11/2022, 9:14 AM

## 2022-02-11 NOTE — Progress Notes (Addendum)
Modified Barium Swallow Progress Note  Patient Details  Name: Gene Hunter MRN: [id number] Date of Birth: 12-24-1925  Today's Date: 02/11/2022  Modified Barium Swallow completed.  Full report located under Chart Review in the Imaging Section.  Brief recommendations include the following:  Clinical Impression  Pt presents with oropharyngeal dysphagia characterized by impaired bolus cohesion, a pharyngeal delay, and reduction in tongue base retraction, hyolaryngeal elevation, anterior laryngeal movement, and pharyngeal constriction. He demonstrated incomplete epiglottic inversion and laryngeal closure, as well as moderate to significant vallecular residue, posterior pharyngeal wall residue, and pyriform sinus residue. Residue was improved slightly with a liquid wash. PES relaxation was minimal and likely impacted by inadequate hyolaryngeal movement. Only minimal amounts of puree were able to transit past the pharynx despite use of liquid washes. The initial bolus of thin liquids via spoon remained in the pt's pharynx and swallowing was not initiated despite verbal prompts and tactile cues. Penetration (PAS 5) and aspiration (PAS 7) of thin liquids, nectar thick liquids and puree were noted. [name] throat clearing/cough were only effective in expelling aspirate when material was just inferior to the vocal folds. Complete epiglottic movement was observed once with consecutive swallows of thin liquids when a chin tuck posture was used, but this could not be replicated and pt was resistant to SLP using tactile cues to encourage his use of a chin tuck posture. A chin tuck improved laryngeal invasion to PAS 5 during the swallow with nectar thick liquids, but aspiration still occured before and after deglutition. Pt is at notable risk of aspiration before, during and after deglutition. He was unable to implement multiple compensatory strategies due to his current mentation; however, those which could be used were  not significantly effective in reducing risk. An NPO status is recommended at this time; however, should family wish to initiate a p.o. diet with known risk of aspiration, a clear liquids (nectar thick) diet would be recommended with strict observance of swallowing precautions. SLP is in agreement with PMT consult for [place].    Swallow Evaluation Recommendations       SLP Diet Recommendations: NPO;Alternative means - temporary (Should family with to initiate p.o. intake, a clear liquid (nectar thick) would reduce, but not eliminate, aspiration risk)   Liquid Administration via: Cup;Spoon   Medication Administration: Via alternative means   Supervision: Full supervision/cueing for compensatory strategies   Compensations: Minimize environmental distractions;Slow rate;Small sips/bites;Multiple dry swallows after each bite/sip;Chin tuck   Postural Changes: Seated upright at 90 degrees;Remain semi-upright after after feeds/meals (Comment)   Oral Care Recommendations: Oral care QID      [name] I. Hardin Negus, [place], [place] Office number [phone] Pager [phone]  Horton Marshall 02/11/2022,2:14 PM

## 2022-02-11 NOTE — Progress Notes (Addendum)
Patient RX:Gene Hunter      DOB: 1925-11-10      QPY:[id number]      Palliative Medicine Team    Subjective: Bedside symptom check completed. Daughter Gene Hunter bedside at time of visit. This RN received a call from nursing staff with concern for bradycardia and decreased responsiveness. This RN rounded prior to provider from PMT due to consult volume.    Physical exam: Patient resting in bed with eyes closed at time of visit. Breathing even and non-labored, no excessive secretions noted. Patient without physical or non-verbal signs of pain or discomfort at this time. Patient with significant generalized bruising and edema. Patient did not acknowledge this RN's presence during visit.    Assessment and plan: This RN met with Gene Hunter [facility]) bedside. This RN introduced palliative care and the role of the PMT inpatient. In discussing the plan of care for this patient, Gene Hunter was clear in explanation that she and her sister in law Gene Media do not want to escalate care, confirming full DNR with comfort focused care, but does not want to covert to full comfort care until her sister in law can see him and discuss (coming tonight). I talked to her about how the patient's heart rate has been dropping into the 30-40s and what that may indicate. She did not want an EKG, stating she did not want to intervene to reverse the cause/ put him through the distress of completing it in his current confused state. All questions answered regarding failed MBS and NPO status. She is understanding that he is rapidly declining even prior to this admission and nearing the end of his life. She wants to be sure his pain is well managed and continue current interventions until follow up conversation tomorrow. I left her with a MOST form, Hard Choices book and answered all of her questions. Plan is to follow up tomorrow to continue [place] conversation with palliative care provider. Will continue to follow for any changes or advances.     Thank you for allowing the Palliative Medicine Team to assist in the care of this patient.     Damian Leavell, MSN, RN Palliative Medicine Team Team Phone: [phone]  This phone is monitored 7a-7p, please reach out to attending physician outside of these hours for urgent needs.

## 2022-02-11 NOTE — Progress Notes (Signed)
Patient ID: Gene Hunter, male   DOB: [birth year], 88 y.o.   MRN: [id number] [facility] Surgery Progress Note     Subjective: CC-  Resting comfortable. Denies any pain.  Objective: Vital signs in last 24 hours: Temp:  [98.5 F (36.9 C)-99.2 F (37.3 C)] 99.1 F (37.3 C) (07/06 0741) Pulse Rate:  [79-98] 79 (07/06 0741) Resp:  [14-22] 20 (07/06 0741) BP: (97-142)/(57-124) 100/67 (07/06 1507) SpO2:  [95 %-100 %] 95 % (07/06 0741)    Intake/Output from previous day: 07/05 0701 - 07/06 0700 In: 342.9 [I.V.:342.9] Out: 410 [Urine:410] Intake/Output this shift: No intake/output data recorded.  PE: Gen:  Alert, NAD Pulm: rate and effort normal Abd: Soft, NT/ND Back: large lower back and upper gluteal hematoma when ecchymosis, no cellulitis Neuro: confused but follows commands  Lab Results:  Recent Labs    02/10/22 0308 02/11/22 0321  WBC 7.1 6.4  HGB 8.1* 8.0*  HCT 24.2* 24.7*  PLT 115* 128*   BMET Recent Labs    02/10/22 0308 02/11/22 0321  NA 142 144  K 4.4 4.4  CL 107 109  CO2 21* 25  GLUCOSE 76 86  BUN 33* 35*  CREATININE 0.98 0.93  CALCIUM 8.9 9.1   PT/INR No results for input(s): "LABPROT", "INR" in the last 72 hours. CMP     Component Value Date/Time   NA 144 02/11/2022 0321   NA 139 01/05/2019 0951   K 4.4 02/11/2022 0321   CL 109 02/11/2022 0321   CO2 25 02/11/2022 0321   GLUCOSE 86 02/11/2022 0321   BUN 35 (H) 02/11/2022 0321   BUN 18 01/05/2019 0951   CREATININE 0.93 02/11/2022 0321   CALCIUM 9.1 02/11/2022 0321   PROT 4.8 (L) 02/08/2022 1352   PROT 6.2 01/05/2019 0951   ALBUMIN 2.7 (L) 02/08/2022 1352   ALBUMIN 4.2 01/05/2019 0951   AST 17 02/08/2022 1352   ALT 16 02/08/2022 1352   ALKPHOS 76 02/08/2022 1352   BILITOT 1.0 02/08/2022 1352   BILITOT 0.7 01/05/2019 0951   GFRNONAA >60 02/11/2022 0321   GFRAA 87 01/05/2019 0951   Lipase  No results found for: "LIPASE"     Studies/Results: DG Swallowing Func-Speech  Pathology  Result Date: 02/11/2022 Table formatting from the original result was not included. Objective Swallowing Evaluation: Type of Study: MBS-Modified Barium Swallow Study  Patient Details Name: Gene Hunter MRN: [id number] Date of Birth: 1925/12/16 Today's Date: 02/11/2022 Time: SLP Start Time (ACUTE ONLY): 1250 -SLP Stop Time (ACUTE ONLY): 2440 SLP Time Calculation (min) (ACUTE ONLY): 18 min Past Medical History: Past Medical History: Diagnosis Date  Blindness of right eye   BPH (benign prostatic hyperplasia)   Essential hypertension   HLD (hyperlipidemia)   Hypothyroidism  Past Surgical History: Past Surgical History: Procedure Laterality Date  bilateral hip replacement    ESOPHAGEAL MANOMETRY N/A 11/21/2017  Procedure: ESOPHAGEAL MANOMETRY (EM);  Surgeon: Carol Ada, MD;  Location: [facility];  Service: Endoscopy;  Laterality: N/A;  HERNIA REPAIR    JOINT REPLACEMENT    right knee replacement   HPI: Pt is a 88 yo male presenting 7/3 s/p fall at home down a flight of stairs. CTH negative for acute findings; CT CAP w/ L5 compression fx and L1 incomplete burst fx. PMH includes: dementia, blindness of L eye, BPH, HTN, hypothyroidism  Subjective: pt alert, confused  Recommendations for follow up therapy are one component of a multi-disciplinary discharge planning process, led by the attending physician.  Recommendations  may be updated based on patient status, additional functional criteria and insurance authorization. Assessment / Plan / Recommendation   02/11/2022   1:14 PM Clinical Impressions Clinical Impression Pt presents with oropharyngeal dysphagia characterized by impaired bolus cohesion, a pharyngeal delay, and reduction in tongue base retraction, hyolaryngeal elevation, anterior laryngeal movement, and pharyngeal constriction. He demonstrated incomplete epiglottic inversion and laryngeal closure, as well as moderate to significant vallecular residue, posterior pharyngeal wall residue, and pyriform  sinus residue. Residue was improved slightly with a liquid wash. PES relaxation was minimal and likely impacted by inadequate hyolaryngeal movement. Only minimal amounts of puree were able to transit past the pharynx despite use of liquid washes. The initial bolus of thin liquids via spoon remained in the pt's pharynx and swallowing was not initiated despite verbal prompts and tactile cues. Penetration (PAS 5) and aspiration (PAS 7) of thin liquids, nectar thick liquids and puree were noted. [name] throat clearing/cough were only effective in expelling aspirate when material was just inferior to the vocal folds. Complete epiglottic movement was observed once with consecutive swallows of thin liquids when a chin tuck posture was used, but this could not be replicated and pt was resistant to SLP using tactile cues to encourage his use of a chin tuck posture. A chin tuck improved laryngeal invasion to PAS 5 during the swallow with nectar thick liquids, but aspiration still occured before and after deglutition. Pt is at notable risk of aspiration before, during and after deglutition. He was unable to implement multiple compensatory strategies due to his current mentation; however, those which could be used were not significantly effective in reducing risk. An NPO status is recommended at this time; however, should family wish to initiate a p.o. diet with known risk of aspiration, a clear liquids (nectar thick) diet would be recommended with strict observance of swallowing precautions. SLP Visit Diagnosis Dysphagia, oropharyngeal phase (R13.12) Impact on safety and function Severe aspiration risk;Risk for inadequate nutrition/hydration     02/11/2022   1:14 PM Treatment Recommendations Treatment Recommendations Therapy as outlined in treatment plan below     02/11/2022   1:14 PM Prognosis Prognosis for Safe Diet Advancement Fair Barriers to Reach Goals Cognitive deficits;Severity of deficits   02/11/2022   1:14 PM Diet  Recommendations SLP Diet Recommendations NPO;Alternative means - temporary Liquid Administration via Cup;Spoon Medication Administration Via alternative means Compensations Minimize environmental distractions;Slow rate;Small sips/bites;Multiple dry swallows after each bite/sip;Chin tuck Postural Changes Seated upright at 90 degrees;Remain semi-upright after after feeds/meals (Comment)     02/11/2022   1:14 PM Other Recommendations Oral Care Recommendations Oral care QID Follow Up Recommendations Skilled nursing-short term rehab (<3 hours/day) Assistance recommended at discharge Frequent or constant Supervision/Assistance Functional Status Assessment Patient has had a recent decline in their functional status and demonstrates the ability to make significant improvements in function in a reasonable and predictable amount of time.   02/11/2022   1:14 PM Frequency and Duration  Speech Therapy Frequency (ACUTE ONLY) min 2x/week Treatment Duration 2 weeks     02/11/2022   1:14 PM Oral Phase Oral Phase Impaired Oral - Nectar Straw Decreased bolus cohesion;Premature spillage Oral - Thin Teaspoon Decreased bolus cohesion;Premature spillage Oral - Thin Straw Decreased bolus cohesion;Premature spillage Oral - Puree Weak lingual manipulation    02/11/2022   1:14 PM Pharyngeal Phase Pharyngeal Phase Impaired Pharyngeal- Nectar Straw Reduced pharyngeal peristalsis;Reduced epiglottic inversion;Reduced anterior laryngeal mobility;Reduced laryngeal elevation;Reduced airway/laryngeal closure;Reduced tongue base retraction;Penetration/Aspiration before swallow;Penetration/Aspiration during swallow;Penetration/Apiration after swallow;Moderate aspiration;Pharyngeal residue -  valleculae;Pharyngeal residue - pyriform;Pharyngeal residue - posterior pharnyx Pharyngeal Material enters airway, CONTACTS cords and not ejected out;Material enters airway, passes BELOW cords and not ejected out despite cough attempt by patient Pharyngeal- Thin Teaspoon  Reduced pharyngeal peristalsis;Reduced epiglottic inversion;Reduced anterior laryngeal mobility;Reduced laryngeal elevation;Reduced airway/laryngeal closure;Reduced tongue base retraction;Penetration/Aspiration before swallow;Penetration/Aspiration during swallow;Penetration/Apiration after swallow;Pharyngeal residue - valleculae;Pharyngeal residue - pyriform;Pharyngeal residue - posterior pharnyx;Trace aspiration Pharyngeal Material enters airway, passes BELOW cords and not ejected out despite cough attempt by patient Pharyngeal- Thin Straw Reduced pharyngeal peristalsis;Reduced epiglottic inversion;Reduced anterior laryngeal mobility;Reduced laryngeal elevation;Reduced airway/laryngeal closure;Reduced tongue base retraction;Penetration/Aspiration before swallow;Penetration/Aspiration during swallow;Penetration/Apiration after swallow;Moderate aspiration;Pharyngeal residue - valleculae;Pharyngeal residue - pyriform;Pharyngeal residue - posterior pharnyx Pharyngeal Material enters airway, CONTACTS cords and not ejected out;Material enters airway, passes BELOW cords and not ejected out despite cough attempt by patient Pharyngeal- Puree Reduced pharyngeal peristalsis;Reduced epiglottic inversion;Reduced anterior laryngeal mobility;Reduced laryngeal elevation;Reduced airway/laryngeal closure;Reduced tongue base retraction;Penetration/Apiration after swallow;Pharyngeal residue - valleculae;Pharyngeal residue - pyriform;Pharyngeal residue - posterior pharnyx Pharyngeal Material enters airway, passes BELOW cords and not ejected out despite cough attempt by patient    02/11/2022   1:14 PM Cervical Esophageal Phase  Cervical Esophageal Phase Impaired Nectar Teaspoon Reduced cricopharyngeal relaxation Nectar Straw Reduced cricopharyngeal relaxation Thin Teaspoon Reduced cricopharyngeal relaxation Thin Straw Reduced cricopharyngeal relaxation Puree Reduced cricopharyngeal relaxation Cervical Esophageal Comment Reduced  cricopharyngeal relaxation; Shanika I. Hardin Negus, [place], [place] Office number [phone] Pager [place] 02/11/2022, 2:16 PM                      Anti-infectives: Anti-infectives (From admission, onward)    Start     Dose/Rate Route Frequency Ordered Stop   02/08/22 1500  ceFEPIme (MAXIPIME) 2 g in sodium chloride 0.9 % 100 mL IVPB  Status:  Discontinued        2 g 200 mL/hr over 30 Minutes Intravenous  Once 02/08/22 1458 02/08/22 1727   02/08/22 1500  vancomycin (VANCOREADY) IVPB 1250 mg/250 mL  Status:  Discontinued        1,250 mg 166.7 mL/hr over 90 Minutes Intravenous  Once 02/08/22 1458 02/08/22 1727        Assessment/Plan 88 yo male s/p fall at home on Eliquis for a fib . Received Kcentra and PRBCs - L flank hematoma: stable evolution, Eliquis on hold. H/h stable today. Not wearing abdominal binder, I think this is ok  - Continue holding anticoagulation - Remainder of care per CCM>>TRH - we will follow  ABL anemia - Hgb 8 from 8.1, stable  ID - none currently FEN - NPO per SLP VTE - SCDs, holding anticoagulation  Foley - in place  A fib on eliquis - holding anticoagulation, s/p kcentra L1 and L5 compression fractures Hypothyroidism BPH Dementia  I reviewed hospitalist notes, last 24 h vitals and pain scores, last 48 h intake and output, last 24 h labs and trends, and last 24 h imaging results    LOS: 3 days    Wellington Hampshire, [place] [facility] 02/11/2022, 4:01 PM Please see [place] for pager number during day hours 7:00am-4:30pm

## 2022-02-11 NOTE — Progress Notes (Signed)
Physical Therapy Treatment Patient Details Name: Gene Hunter MRN: [id number] DOB: 1925/10/08 Today's Date: 02/11/2022   History of Present Illness 88 yo male presented 02/08/22 s/p fall at home, found down by a family member. Work up demonstrated acute L5 compression and L1 complete burst fracture, L1 vertebral body with at least 45% vertebral body height loss and 2 mm  retropulsion into the central canal. PMHx: blind R eye, BPH, EH, HLD, hypothyroidism    PT Comments    Pt tolerates treatment well with encouragement. Pt initially requiring physical initiation of all mobility but later follows cues for anterior lean as well as initiation of transfers. Pt often with posterior lean and significant generalized weakness which both place him at a high risk for falls. Pt will benefit from continued mobility attempts in an effort to reduce caregiver burden and falls risk. PT continues to recommend SNF placement at this time.  Recommendations for follow up therapy are one component of a multi-disciplinary discharge planning process, led by the attending physician.  Recommendations may be updated based on patient status, additional functional criteria and insurance authorization.  Follow Up Recommendations  Skilled nursing-short term rehab (<3 hours/day) Can patient physically be transported by private vehicle: No   Assistance Recommended at Discharge Frequent or constant Supervision/Assistance  Patient can return home with the following Two people to help with bathing/dressing/bathroom;Two people to help with walking and/or transfers;Assistance with cooking/housework;Assistance with feeding;Direct supervision/assist for medications management;Direct supervision/assist for financial management;Assist for transportation;Help with stairs or ramp for entrance   Equipment Recommendations  Hospital bed;Wheelchair (measurements PT);Wheelchair cushion (measurements PT);Other (comment) (hoyer lift)     Recommendations for Other Services       Precautions / Restrictions Precautions Precautions: Fall;Back Precaution Booklet Issued: No Precaution Comments: posey belt, bilat mitts, dementia at baseline Required Braces or Orthoses: Other Brace;Spinal Brace Spinal Brace: Lumbar corset;Applied in sitting position Other Brace: abdominal binder Restrictions Weight Bearing Restrictions: No     Mobility  Bed Mobility Overal bed mobility: Needs Assistance Bed Mobility: Rolling, Sidelying to Sit, Sit to Sidelying Rolling: Total assist, +2 for physical assistance Sidelying to sit: +2 for physical assistance, Max assist     Sit to sidelying: Total assist, +2 for physical assistance General bed mobility comments: pt requires assistance for initiation of reaching across and for rolling. Pt does push up into upright with significant assistance    Transfers Overall transfer level: Needs assistance Equipment used: 2 person hand held assist Transfers: Sit to/from Stand Sit to Stand: Max assist, +2 physical assistance           General transfer comment: bilateral knee block and BUE support, use of bed pad to facilitate hip extension, pt with flexed trunk and hips during all 3 stands    Ambulation/Gait                   Stairs             Wheelchair Mobility    Modified Rankin (Stroke Patients Only)       Balance Overall balance assessment: Needs assistance Sitting-balance support: Single extremity supported, Bilateral upper extremity supported, Feet supported Sitting balance-Leahy Scale: Poor Sitting balance - Comments: min-maxA Postural control: Posterior lean Standing balance support: Bilateral upper extremity supported Standing balance-Leahy Scale: Zero Standing balance comment: maxA x2                            Cognition Arousal/Alertness: Awake/alert (  awakens with stimulation) Behavior During Therapy: Anxious Overall Cognitive Status:  History of cognitive impairments - at baseline                                 General Comments: oriented to self only, confused throughout session, at times believes he is in his workshop at home        Exercises      General Comments General comments (skin integrity, edema, etc.): VSS on RA      Pertinent Vitals/Pain Pain Assessment Pain Assessment: PAINAD Breathing: normal Negative Vocalization: occasional moan/groan, low speech, negative/disapproving quality Facial Expression: smiling or inexpressive Body Language: relaxed Consolability: distracted or reassured by voice/touch PAINAD Score: 2    Home Living                          Prior Function            PT Goals (current goals can now be found in the care plan section) Acute Rehab PT Goals Patient Stated Goal: to go home Progress towards PT goals: Progressing toward goals (very slowly)    Frequency    Min 4X/week (maintain in an effort to progress, would consider reducing frequency next week, 7/10)      PT Plan Current plan remains appropriate    Co-evaluation              AM-PAC PT "6 Clicks" Mobility   Outcome Measure  Help needed turning from your back to your side while in a flat bed without using bedrails?: Total Help needed moving from lying on your back to sitting on the side of a flat bed without using bedrails?: Total Help needed moving to and from a bed to a chair (including a wheelchair)?: Total Help needed standing up from a chair using your arms (e.g., wheelchair or bedside chair)?: Total Help needed to walk in hospital room?: Total Help needed climbing 3-5 steps with a railing? : Total 6 Click Score: 6    End of Session Equipment Utilized During Treatment: Gait belt;Back brace Activity Tolerance: Patient tolerated treatment well Patient left: in bed;with call bell/phone within reach;with bed alarm set;with family/visitor present Nurse Communication:  Mobility status;Need for lift equipment PT Visit Diagnosis: Muscle weakness (generalized) (M62.81);Repeated falls (R29.6);History of falling (Z91.81);Difficulty in walking, not elsewhere classified (R26.2);Pain Pain - Right/Left: Left     Time: 1287-8676 PT Time Calculation (min) (ACUTE ONLY): 26 min  Charges:  $Therapeutic Activity: 23-37 mins                     Zenaida Niece, PT, DPT Acute Rehabilitation Office [phone]    Zenaida Niece 02/11/2022, 3:19 PM

## 2022-02-11 NOTE — TOC Initial Note (Signed)
Transition of Care [facility]) - Initial/Assessment Note    Patient Details  Name: Gene Hunter MRN: [id number] Date of Birth: December 06, 1925  Transition of Care [facility]) CM/SW Contact:    Bethann Berkshire, [place] Phone Number: 02/11/2022, 3:12 PM  Clinical Narrative:                  PT/OT currently recommending SNF though PMT plans to meet with family. TOC will follow while GOC are being determined.    Barriers to Discharge: Continued Medical Work up   Patient Goals and CMS Choice        Expected Discharge Plan and Services         Living arrangements for the past 2 months: Single Family Home                                      Prior Living Arrangements/Services Living arrangements for the past 2 months: Single Family Home Lives with:: Self                   Activities of Daily Living Home Assistive Devices/Equipment: None ADL Screening (condition at time of admission) Patient's cognitive ability adequate to safely complete daily activities?: No Is the patient deaf or have difficulty hearing?: No Does the patient have difficulty seeing, even when wearing glasses/contacts?: Yes Does the patient have difficulty concentrating, remembering, or making decisions?: Yes Patient able to express need for assistance with ADLs?: No Does the patient have difficulty dressing or bathing?: Yes Independently performs ADLs?: No Communication: Independent Dressing (OT): Needs assistance Is this a change from baseline?: Change from baseline, expected to last >3 days Grooming: Needs assistance Is this a change from baseline?: Change from baseline, expected to last >3 days Feeding: Needs assistance Is this a change from baseline?: Change from baseline, expected to last >3 days Bathing: Dependent Is this a change from baseline?: Change from baseline, expected to last >3 days Toileting: Dependent Is this a change from baseline?: Change from baseline, expected to last >3days In/Out  Bed: Dependent Is this a change from baseline?: Change from baseline, expected to last >3 days Walks in Home: Needs assistance Is this a change from baseline?: Change from baseline, expected to last >3 days Does the patient have difficulty walking or climbing stairs?: Yes Weakness of Legs: None Weakness of Arms/Hands: Right  Permission Sought/Granted                  Emotional Assessment         Alcohol / Substance Use: Not Applicable Psych Involvement: No (comment)  Admission diagnosis:  Fall [W19.XXXA] Fall, initial encounter [id number]] Anemia, unspecified type [D64.9] Patient Active Problem List   Diagnosis Date Noted   Pressure injury of skin 02/10/2022   Gross hematuria    Atrial fibrillation, chronic (HCC)    Hypothermia    Acute blood loss anemia    Acute urinary retention    Dysphagia    Acute metabolic encephalopathy    Compression fracture of L1 lumbar vertebra (HCC)    Anemia    Abnormal weight loss 11/19/2020   Blind left eye 11/19/2020   Encounter for follow-up examination after completed treatment for conditions other than malignant neoplasm 11/19/2020   Hearing loss 11/19/2020   History of DVT (deep vein thrombosis) 11/19/2020   History of hip fracture 11/19/2020   Poor balance 11/19/2020   Pure hypercholesterolemia 11/19/2020  Type 2 diabetes mellitus without complications ([place]) 15/37/9432   Underweight 11/19/2020   Vitamin D deficiency 11/19/2020   Aortic heart murmur 08/26/2019   Atrial fibrillation ([place]) 10/08/2018   Bilateral acute serous otitis media 11/03/2016   Bilateral hearing loss 11/03/2016   Closed fracture of left hip ([place]) 09/04/2016   Constipation 09/04/2016   Fall 09/04/2016   BPH (benign prostatic hyperplasia) 09/04/2016   Essential hypertension    HLD (hyperlipidemia)    Hypothyroidism    PCP:  [name], MD Pharmacy:   [facility] [place] [address] Phone: [phone] Fax: [phone]  [facility] [place], [place] [address] [place] [address] Phone: [phone] Fax: [phone]     Social Determinants of Health (SDOH) Interventions    Readmission Risk Interventions     No data to display

## 2022-02-12 DIAGNOSIS — Z7189 Other specified counseling: Secondary | ICD-10-CM

## 2022-02-12 DIAGNOSIS — W19XXXD Unspecified fall, subsequent encounter: Secondary | ICD-10-CM | POA: Diagnosis not present

## 2022-02-12 DIAGNOSIS — Z515 Encounter for palliative care: Secondary | ICD-10-CM | POA: Diagnosis not present

## 2022-02-12 DIAGNOSIS — D649 Anemia, unspecified: Secondary | ICD-10-CM | POA: Diagnosis not present

## 2022-02-12 DIAGNOSIS — E039 Hypothyroidism, unspecified: Secondary | ICD-10-CM | POA: Diagnosis not present

## 2022-02-12 DIAGNOSIS — Z66 Do not resuscitate: Secondary | ICD-10-CM

## 2022-02-12 DIAGNOSIS — R310 Gross hematuria: Secondary | ICD-10-CM | POA: Diagnosis not present

## 2022-02-12 LAB — BASIC METABOLIC PANEL
Anion gap: 6 (ref 5–15)
BUN: 38 mg/dL — ABNORMAL HIGH (ref 8–23)
CO2: 25 mmol/L (ref 22–32)
Calcium: 8.6 mg/dL — ABNORMAL LOW (ref 8.9–10.3)
Chloride: 112 mmol/L — ABNORMAL HIGH (ref 98–111)
Creatinine, Ser: 1.06 mg/dL (ref 0.61–1.24)
GFR, Estimated: >60 mL/min (ref >60)
Glucose, Bld: 76 mg/dL (ref 70–99)
Potassium: 4.5 mmol/L (ref 3.5–5.1)
Sodium: 143 mmol/L (ref 135–145)

## 2022-02-12 LAB — CBC WITH DIFFERENTIAL/PLATELET
Abs Immature Granulocytes: 0.01 10*3/uL (ref 0.00–0.07)
Basophils Absolute: 0 10*3/uL (ref 0.0–0.1)
Basophils Relative: 0 %
Eosinophils Absolute: 0 10*3/uL (ref 0.0–0.5)
Eosinophils Relative: 0 %
HCT: 25.5 % — ABNORMAL LOW (ref 39.0–52.0)
Hemoglobin: 7.9 g/dL — ABNORMAL LOW (ref 13.0–17.0)
Immature Granulocytes: 0 %
Lymphocytes Relative: 16 %
Lymphs Abs: 0.9 10*3/uL (ref 0.7–4.0)
MCH: 29.2 pg (ref 26.0–34.0)
MCHC: 31 g/dL (ref 30.0–36.0)
MCV: 94.1 fL (ref 80.0–100.0)
Monocytes Absolute: 0.7 10*3/uL (ref 0.1–1.0)
Monocytes Relative: 12 %
Neutro Abs: 4.1 10*3/uL (ref 1.7–7.7)
Neutrophils Relative %: 72 %
Platelets: 145 10*3/uL — ABNORMAL LOW (ref 150–400)
RBC: 2.71 MIL/uL — ABNORMAL LOW (ref 4.22–5.81)
RDW: 18.4 % — ABNORMAL HIGH (ref 11.5–15.5)
WBC: 5.8 10*3/uL (ref 4.0–10.5)
nRBC: 0.3 % — ABNORMAL HIGH (ref 0.0–0.2)

## 2022-02-12 MED ORDER — MORPHINE SULFATE 10 MG/5ML PO SOLN: 5.0000 mg | ORAL | Status: DC | PRN

## 2022-02-12 MED ORDER — LORAZEPAM 2 MG/ML IJ SOLN: 0.5000 mg | INTRAMUSCULAR | Status: DC | PRN

## 2022-02-12 MED ORDER — POLYVINYL ALCOHOL 1.4 % OP SOLN: 1.0000 [drp] | Freq: Four times a day (QID) | OPHTHALMIC | Status: DC | PRN

## 2022-02-12 MED ORDER — GLYCOPYRROLATE 0.2 MG/ML IJ SOLN: 0.2000 mg | INTRAMUSCULAR | Status: DC | PRN

## 2022-02-12 MED ORDER — GLYCOPYRROLATE 1 MG PO TABS: 1.0000 mg | ORAL_TABLET | ORAL | Status: DC | PRN

## 2022-02-12 MED ORDER — ONDANSETRON HCL 4 MG/2ML IJ SOLN: 4.0000 mg | Freq: Four times a day (QID) | INTRAMUSCULAR | Status: DC | PRN

## 2022-02-12 MED ORDER — ONDANSETRON 4 MG PO TBDP: 4.0000 mg | ORAL_TABLET | Freq: Four times a day (QID) | ORAL | Status: DC | PRN

## 2022-02-12 NOTE — Progress Notes (Signed)
PROGRESS NOTE    Gene Hunter  BJS:[id number] DOB: 1925/10/11 DOA: 02/08/2022 PCP: [name], MD    Chief Complaint  Patient presents with   level 2 fall    Brief Narrative:  88 y/o M with dementia, DNR as of 2020  on eliquis for atrial fibrillation, HTN, Hyperlipidemia ,Admitted 02/08/2022 after  fall down 53f flight of stairs. Found down with Multiple abrasions. Hypotensive, hypothermic. Hgb 7's, WBC is 5.6, Platelets are 113,  got 2L IVF, on 1st unit of blood. FOBT neg, FAST neg. Pan imaging  revealed Mild pulmonary edema with bilateral trace to small volume pleural Effusions, A 20 x 4.5 x 20 cm bilateral lower back soft tissue hematoma formation. Acute L5 compression and L1 complete burst fracture. L1 vertebral body with at least 45% vertebral body height loss and 2 mm retropulsion into the central canal..  CTH and CT C-spine negative for acute traumatic injury. Per family, patient has been hallucinating and talking to his deceased wife for the last 5 days. Per ED notes, there is concern for sepsis, patient has been started on Vanc and Cefepime, which has subsequently been discontinued, as he is afebrile and WBC is normal. In the ED CK was 234. Troponin was 105, BUN 25, Creatinine 0.80, Total Protein 4.8, Albumin 2.7.  Lactate 1.7   PCCM have been called to admit for hypotension and manage care.    Assessment & Plan:   Principal Problem:   Fall Active Problems:   Pressure injury of skin   Gross hematuria   Atrial fibrillation, chronic (HCC)   Hypothermia   Acute blood loss anemia   Acute urinary retention   Dysphagia   Acute metabolic encephalopathy   Compression fracture of L1 lumbar vertebra (HCC)  #1 traumatic fall on blood thinners/lower back hematoma -Patient noted to have traumatic fall on blood thinners noted to have a lower back hematoma with anemia hemoglobin as low as 7.1. -Patient noted to have been [facility]  C-spine negative for any acute traumatic injury. -Patient's blood pressure responded with transfusion per PCCM.  Anticoagulation on hold. -Hemoglobin 7.9 this morning. -Trauma surgery following. -Appreciate trauma surgery input and recommendations.  2.  Acute blood loss anemia/anemia -Patient noted to have traumatic fall with lower back hematoma hemoglobin as low as 7.1 -Anemia panel consistent with iron deficiency anemia/anemia of chronic disease. -Hemoglobin currently at 7.9 and seems to have stabilized. -Anticoagulation on hold. -Follow H&H. -Transfusion threshold hemoglobin < 7.   3.  A-fib on Eliquis  -Status post Kcentra 02/08/2022. -Due to patient's dementia, recent fall, will hold off on resuming anticoagulation and defer to the outpatient setting to determine whether anticoagulation is still needed. -Palliative care consultation pending.  4.  Hypothermia/rule out sepsis -Patient pancultured. -Urinalysis ordered and urine cultures with no growth to date. -Patient currently afebrile. -Continue to hold off on antibiotics at this time.  5.  Hypothyroidism -Synthroid resumed. -Outpatient follow-up.  6.  Urinary retention/hematuria -Patient noted with urinary retention, Foley catheter placed which was likely traumatic as patient noted to have significant hematuria.   -Hematuria cleared with Foley catheter irrigation.   -Urine cultures negative.   -We will discontinue Foley catheter irrigation.   -Supportive care.   7.  BPH -Continue Alfuzosin.  8.?  Dementia/?  Ability to live alone -SW/PT/OT consults placed. -Patient seems to be deteriorating, with dysphagia, decreased mental status. -Patient following some commands today, answering questions appropriately. -Continue Zyprexa at  1800 hrs. Daily. -Palliative care consultation pending for goals of care.  9.  Acute metabolic encephalopathy -Per family patient noted to have some hallucinations and talking to his deceased  wife for 5 days prior to admission. -Patient with history of dementia, likely worsening with some associated dysphagia. -Urinalysis noted to be turbid, red, WBC  21-50. -Urine cultures with no growth to date. -TSH at 9.960 patient resumed back on Synthroid. -Folate level of 13.3. -Vitamin B12 at 1387. -Patient with some clinical improvement today, being more communicative, following some commands, answering some questions. -Supportive care. -Palliative care consultation pending.  10.  Dysphagia -Patient seen by SLP, patient underwent modified barium swallow on 02/11/2022 and noted to be high aspiration risk and as such patient made n.p.o. pending palliative care evaluation.   -Palliative care consultation pending.    11.  Acute compression fractures of L1 and L5 -Noted on scans on presentation with 45% vertebral height loss of L1 and some retropulsion into the canal. -Patient seen in consultation by neurosurgery who stated given his age and risk of surgery do not think patient is a surgical candidate at this time and recommending LSO brace when ambulating out of bed with outpatient follow-up with neurosurgery.  12.  Pressure injury, POA Pressure Injury 02/08/22 Buttocks Right;Left Stage 1 -  Intact skin with non-blanchable redness of a localized area usually over a bony prominence. (Active)  02/08/22 2100  Location: Buttocks  Location Orientation: Right;Left  Staging: Stage 1 -  Intact skin with non-blanchable redness of a localized area usually over a bony prominence.  Wound Description (Comments):   Present on Admission: Yes    13.  Prognosis -Patient with a poor prognosis, and deteriorating.  -Patient with dementia, worsening dysphagia, noted to have fallen with hematomas with some acute metabolic encephalopathy. -Patient currently n.p.o., high aspiration risk, significantly poor oral intake. -In discussions with family seems may be leaning towards comfort measures. -Palliative care  consultation pending for goals of care.    DVT prophylaxis: SCDs Code Status: DNR Family Communication: Updated daughter in law at bedside Disposition: TBD  Status is: Inpatient Remains inpatient appropriate because: Severity of illness   Consultants:  General surgery: Dr. Georganna Skeans 02/08/2022 Neurosurgery: Dr. Ronnald Ramp 02/08/2022 PCCM admission Palliative care pending  Procedures:  CT head/CT maxillofacial/CT C-spine 02/08/2022 CT chest abdomen and pelvis 02/08/2022 Chest x-ray 02/08/2022 Plain films of the pelvis 02/08/2022 2D echo 02/09/2022  Antimicrobials:  IV cefepime 02/08/2022 x 1 dose.   Subjective: Patient sleeping but arousable. More communicative today. No CP, No SOB.  Objective: Vitals:   02/11/22 1507 02/11/22 1934 02/12/22 0541 02/12/22 0546  BP: 100/67 104/62 98/65   Pulse:  66 67   Resp:  15 (!) 21   Temp:  (!) 97.4 F (36.3 C) (!) 95.9 F (35.5 C) (!) 97.4 F (36.3 C)  TempSrc:  Axillary Axillary Axillary  SpO2:  100% 100%   Weight:   68.9 kg   Height:        Intake/Output Summary (Last 24 hours) at 02/12/2022 1019 Last data filed at 02/12/2022 0910 Gross per 24 hour  Intake 1302.83 ml  Output 225 ml  Net 1077.83 ml    Filed Weights   02/09/22 0500 02/10/22 1436 02/12/22 0541  Weight: 68.1 kg 67.8 kg 68.9 kg    Examination:  General exam: Sleeping, but arousable. Respiratory system: CTA B anterior lung fields.  No wheezes, no crackles, no rhonchi.  Normal respiratory effort.   Cardiovascular system: Irregularly  irregular.  No JVD, no murmurs rubs or gallops.  No lower extremity edema.   Gastrointestinal system: Abdomen is nondistended, soft and nontender. No organomegaly or masses felt. Normal bowel sounds heard.  Abdominal binder Central nervous system: Sleeping but arousable.  Moving extremities spontaneously.  Extremities: Symmetric 5 x 5 power. Skin: Ecchymosis noted on lower back and sacral region. Psychiatry: Judgement and insight appear  poor- fair. Mood & affect appropriate.     Data Reviewed: I have personally reviewed following labs and imaging studies  CBC: Recent Labs  Lab 02/08/22 1352 02/08/22 1401 02/09/22 0257 02/10/22 0308 02/11/22 0321 02/12/22 0124  WBC 5.6  --  8.6 7.1 6.4 5.8  NEUTROABS 4.4  --   --   --   --  4.1  HGB 7.6* 7.1* 9.3* 8.1* 8.0* 7.9*  HCT 24.0* 21.0* 27.6* 24.2* 24.7* 25.5*  MCV 98.0  --  88.5 88.6 91.8 94.1  PLT 113*  --  125* 115* 128* 145*     Basic Metabolic Panel: Recent Labs  Lab 02/08/22 1352 02/08/22 1401 02/09/22 0257 02/10/22 0308 02/11/22 0321 02/12/22 0124  NA 141 140 142 142 144 143  K 4.8 4.8 4.5 4.4 4.4 4.5  CL 107 105 108 107 109 112*  CO2 26  --  23 21* 25 25  GLUCOSE 130* 126* 87 76 86 76  BUN 26* 25* 28* 33* 35* 38*  CREATININE 0.93 0.80 0.85 0.98 0.93 1.06  CALCIUM 8.9  --  8.8* 8.9 9.1 8.6*     GFR: Estimated Creatinine Clearance: 42.4 mL/min (by C-G formula based on SCr of 1.06 mg/dL).  Liver Function Tests: Recent Labs  Lab 02/08/22 1352  AST 17  ALT 16  ALKPHOS 76  BILITOT 1.0  PROT 4.8*  ALBUMIN 2.7*     CBG: No results for input(s): "GLUCAP" in the last 168 hours.   Recent Results (from the past 240 hour(s))  MRSA Next Gen by PCR, Nasal     Status: None   Collection Time: 02/08/22  8:46 PM   Specimen: Nasal Mucosa; Nasal Swab  Result Value Ref Range Status   MRSA by PCR Next Gen NOT DETECTED NOT DETECTED Final    Comment: (NOTE) The GeneXpert MRSA Assay (FDA approved for NASAL specimens only), is one component of a comprehensive MRSA colonization surveillance program. It is not intended to diagnose MRSA infection nor to guide or monitor treatment for MRSA infections. Test performance is not FDA approved in patients less than 42 years old. Performed at [facility], [place] [address] [place], [place] [address]   Culture, blood (routine x 2)     Status: None (Preliminary result)   Collection Time: 02/08/22  9:12 PM    Specimen: BLOOD  Result Value Ref Range Status   Specimen Description BLOOD RIGHT ANTECUBITAL  Final   Special Requests   Final    BOTTLES DRAWN AEROBIC AND ANAEROBIC Blood Culture adequate volume   Culture   Final    NO GROWTH 3 DAYS Performed at [facility], [place] [address] [place], [place] [address]    Report Status PENDING  Incomplete  Culture, blood (routine x 2)     Status: None (Preliminary result)   Collection Time: 02/08/22  9:12 PM   Specimen: BLOOD  Result Value Ref Range Status   Specimen Description BLOOD BLOOD RIGHT HAND  Final   Special Requests   Final    BOTTLES DRAWN AEROBIC AND ANAEROBIC Blood Culture  results may not be optimal due to an inadequate volume of blood received in culture bottles   Culture   Final    NO GROWTH 3 DAYS Performed at [facility], [place] [address] [place], [place] [address]    Report Status PENDING  Incomplete  Urine Culture     Status: None   Collection Time: 02/10/22  8:24 AM   Specimen: Urine, Clean Catch  Result Value Ref Range Status   Specimen Description URINE, CLEAN CATCH  Final   Special Requests NONE  Final   Culture   Final    NO GROWTH Performed at [facility], [place] [address] [place], [place] [address]    Report Status 02/11/2022 FINAL  Final         Radiology Studies: DG Swallowing Func-Speech Pathology  Result Date: 02/11/2022 Table formatting from the original result was not included. Objective Swallowing Evaluation: Type of Study: MBS-Modified Barium Swallow Study  Patient Details Name: DAYLON LAFAVOR MRN: [id number] Date of Birth: 1925-09-04 Today's Date: 02/11/2022 Time: SLP Start Time (ACUTE ONLY): 1250 -SLP Stop Time (ACUTE ONLY): 5809 SLP Time Calculation (min) (ACUTE ONLY): 18 min Past Medical History: Past Medical History: Diagnosis Date  Blindness of right eye   BPH (benign prostatic hyperplasia)   Essential hypertension   HLD (hyperlipidemia)   Hypothyroidism  Past Surgical History: Past  Surgical History: Procedure Laterality Date  bilateral hip replacement    ESOPHAGEAL MANOMETRY N/A 11/21/2017  Procedure: ESOPHAGEAL MANOMETRY (EM);  Surgeon: Carol Ada, MD;  Location: [facility];  Service: Endoscopy;  Laterality: N/A;  HERNIA REPAIR    JOINT REPLACEMENT    right knee replacement   HPI: Pt is a 88 yo male presenting 7/3 s/p fall at home down a flight of stairs. CTH negative for acute findings; CT CAP w/ L5 compression fx and L1 incomplete burst fx. PMH includes: dementia, blindness of L eye, BPH, HTN, hypothyroidism  Subjective: pt alert, confused  Recommendations for follow up therapy are one component of a multi-disciplinary discharge planning process, led by the attending physician.  Recommendations may be updated based on patient status, additional functional criteria and insurance authorization. Assessment / Plan / Recommendation   02/11/2022   1:14 PM Clinical Impressions Clinical Impression Pt presents with oropharyngeal dysphagia characterized by impaired bolus cohesion, a pharyngeal delay, and reduction in tongue base retraction, hyolaryngeal elevation, anterior laryngeal movement, and pharyngeal constriction. He demonstrated incomplete epiglottic inversion and laryngeal closure, as well as moderate to significant vallecular residue, posterior pharyngeal wall residue, and pyriform sinus residue. Residue was improved slightly with a liquid wash. PES relaxation was minimal and likely impacted by inadequate hyolaryngeal movement. Only minimal amounts of puree were able to transit past the pharynx despite use of liquid washes. The initial bolus of thin liquids via spoon remained in the pt's pharynx and swallowing was not initiated despite verbal prompts and tactile cues. Penetration (PAS 5) and aspiration (PAS 7) of thin liquids, nectar thick liquids and puree were noted. [name] throat clearing/cough were only effective in expelling aspirate when material was just inferior to the vocal folds.  Complete epiglottic movement was observed once with consecutive swallows of thin liquids when a chin tuck posture was used, but this could not be replicated and pt was resistant to SLP using tactile cues to encourage his use of a chin tuck posture. A chin tuck improved laryngeal invasion to PAS 5 during the swallow with nectar thick liquids, but aspiration still occured  before and after deglutition. Pt is at notable risk of aspiration before, during and after deglutition. He was unable to implement multiple compensatory strategies due to his current mentation; however, those which could be used were not significantly effective in reducing risk. An NPO status is recommended at this time; however, should family wish to initiate a p.o. diet with known risk of aspiration, a clear liquids (nectar thick) diet would be recommended with strict observance of swallowing precautions. SLP Visit Diagnosis Dysphagia, oropharyngeal phase (R13.12) Impact on safety and function Severe aspiration risk;Risk for inadequate nutrition/hydration     02/11/2022   1:14 PM Treatment Recommendations Treatment Recommendations Therapy as outlined in treatment plan below     02/11/2022   1:14 PM Prognosis Prognosis for Safe Diet Advancement Fair Barriers to Reach Goals Cognitive deficits;Severity of deficits   02/11/2022   1:14 PM Diet Recommendations SLP Diet Recommendations NPO;Alternative means - temporary Liquid Administration via Cup;Spoon Medication Administration Via alternative means Compensations Minimize environmental distractions;Slow rate;Small sips/bites;Multiple dry swallows after each bite/sip;Chin tuck Postural Changes Seated upright at 90 degrees;Remain semi-upright after after feeds/meals (Comment)     02/11/2022   1:14 PM Other Recommendations Oral Care Recommendations Oral care QID Follow Up Recommendations Skilled nursing-short term rehab (<3 hours/day) Assistance recommended at discharge Frequent or constant Supervision/Assistance  Functional Status Assessment Patient has had a recent decline in their functional status and demonstrates the ability to make significant improvements in function in a reasonable and predictable amount of time.   02/11/2022   1:14 PM Frequency and Duration  Speech Therapy Frequency (ACUTE ONLY) min 2x/week Treatment Duration 2 weeks     02/11/2022   1:14 PM Oral Phase Oral Phase Impaired Oral - Nectar Straw Decreased bolus cohesion;Premature spillage Oral - Thin Teaspoon Decreased bolus cohesion;Premature spillage Oral - Thin Straw Decreased bolus cohesion;Premature spillage Oral - Puree Weak lingual manipulation    02/11/2022   1:14 PM Pharyngeal Phase Pharyngeal Phase Impaired Pharyngeal- Nectar Straw Reduced pharyngeal peristalsis;Reduced epiglottic inversion;Reduced anterior laryngeal mobility;Reduced laryngeal elevation;Reduced airway/laryngeal closure;Reduced tongue base retraction;Penetration/Aspiration before swallow;Penetration/Aspiration during swallow;Penetration/Apiration after swallow;Moderate aspiration;Pharyngeal residue - valleculae;Pharyngeal residue - pyriform;Pharyngeal residue - posterior pharnyx Pharyngeal Material enters airway, CONTACTS cords and not ejected out;Material enters airway, passes BELOW cords and not ejected out despite cough attempt by patient Pharyngeal- Thin Teaspoon Reduced pharyngeal peristalsis;Reduced epiglottic inversion;Reduced anterior laryngeal mobility;Reduced laryngeal elevation;Reduced airway/laryngeal closure;Reduced tongue base retraction;Penetration/Aspiration before swallow;Penetration/Aspiration during swallow;Penetration/Apiration after swallow;Pharyngeal residue - valleculae;Pharyngeal residue - pyriform;Pharyngeal residue - posterior pharnyx;Trace aspiration Pharyngeal Material enters airway, passes BELOW cords and not ejected out despite cough attempt by patient Pharyngeal- Thin Straw Reduced pharyngeal peristalsis;Reduced epiglottic inversion;Reduced anterior  laryngeal mobility;Reduced laryngeal elevation;Reduced airway/laryngeal closure;Reduced tongue base retraction;Penetration/Aspiration before swallow;Penetration/Aspiration during swallow;Penetration/Apiration after swallow;Moderate aspiration;Pharyngeal residue - valleculae;Pharyngeal residue - pyriform;Pharyngeal residue - posterior pharnyx Pharyngeal Material enters airway, CONTACTS cords and not ejected out;Material enters airway, passes BELOW cords and not ejected out despite cough attempt by patient Pharyngeal- Puree Reduced pharyngeal peristalsis;Reduced epiglottic inversion;Reduced anterior laryngeal mobility;Reduced laryngeal elevation;Reduced airway/laryngeal closure;Reduced tongue base retraction;Penetration/Apiration after swallow;Pharyngeal residue - valleculae;Pharyngeal residue - pyriform;Pharyngeal residue - posterior pharnyx Pharyngeal Material enters airway, passes BELOW cords and not ejected out despite cough attempt by patient    02/11/2022   1:14 PM Cervical Esophageal Phase  Cervical Esophageal Phase Impaired Nectar Teaspoon Reduced cricopharyngeal relaxation Nectar Straw Reduced cricopharyngeal relaxation Thin Teaspoon Reduced cricopharyngeal relaxation Thin Straw Reduced cricopharyngeal relaxation Puree Reduced cricopharyngeal relaxation Cervical Esophageal Comment Reduced cricopharyngeal relaxation; Shanika I. Hardin Negus, [place],  [place] Office number [phone] Pager [phone] Horton Marshall 02/11/2022, 2:16 PM                          Scheduled Meds:  alfuzosin  10 mg Oral Q breakfast   Chlorhexidine Gluconate Cloth  6 each Topical Daily   [START ON 02/14/2022] levothyroxine  100 mcg Intravenous Daily   OLANZapine zydis  2.5 mg Oral Daily   mouth rinse  15 mL Mouth Rinse 4 times per day   Continuous Infusions:  lactated ringers 75 mL/hr at 02/12/22 0910     LOS: 4 days    Time spent: 40 minutes    Irine Seal, MD [facility]   To contact the attending provider between 7A-7P or the covering provider during after hours 7P-7A, please log into the web site [email or web address] and access using universal [place] password for that web site. If you do not have the password, please call the hospital operator.  02/12/2022, 10:19 AM

## 2022-02-12 NOTE — Progress Notes (Signed)
Hgb stable. Noted palliative note of patient transferring to [facility]. We will sign off.    Jillyn Ledger, PA-C

## 2022-02-12 NOTE — Progress Notes (Signed)
Occupational Therapy Discharge Patient Details Name: Gene Hunter MRN: [id number] DOB: [birth year] Today's Date: 02/12/2022 Time:  -     Patient discharged from OT services secondary to patient transitioning to comfort care and home with hospice   Please see latest therapy progress note for current level of functioning and progress toward goals.    Progress and discharge plan discussed with patient and/or caregiver: Patient unable to participate in discharge planning and no caregivers available  [place], [place]  Office [place] 02/12/2022, 11:40 AM

## 2022-02-12 NOTE — Progress Notes (Signed)
Manufacturing engineer [facility]) Hospital Liaison Note  Referral received for patient/family interest in home with hospice. [place] liaison spoke with patient's daughter Gene Hunter to confirm interest. Interest confirmed.   Hospice eligibility confirmed.   Plan is to discharge home tomorrow via ambulance after DME has been delivered.   DME in the home: walker, shower bench, and BSC.   DME needs; hospital bed and oxygen(2 liters)   Please send patient home with comfort medications/prescriptions at discharge.   Please call with any questions or concerns. Thank you  Roselee Nova, [place] Hospital Liaison [phone]

## 2022-02-12 NOTE — TOC Progression Note (Signed)
Transition of Care [facility]) - Progression Note    Patient Details  Name: Gene Hunter MRN: [id number] Date of Birth: Jun 14, 1926  Transition of Care [facility]) CM/SW [place], [place] Phone Number: 02/12/2022, 10:41 AM  Clinical Narrative:     CSW informed by PMT that family interested in pt returning home with hospice. CSW met with pt DIL Gene Hunter bedside. Gene Hunter called pt's daughter Gene Hunter on speaker phone. Confirmed they desire for pt to return to his home with hospice. [name] daughter and DIL will be primary caregivers and they have other family/friends who will assist as well. Family has no preference in agencies; they are okay with referral to [place]. CSW explained referral process. Pt has a shower bench and walker. Family states they will definitely need hospital bed. Pt is also on o2. CSW made referral to [facility] liaison; they will arrange equipment and start of care once referral is processed. Likely DC tomorrow once family has pt's home/care prepared and equipment is arranged.    Expected Discharge Plan: Home w Hospice Care Barriers to Discharge: Other (must enter comment) (Pending hospital bed and o2 arranged through [facility] at home)  Expected Discharge Plan and Services Expected Discharge Plan: [place] arrangements for the past 2 months: Single Family Home                                       Social Determinants of Health (SDOH) Interventions    Readmission Risk Interventions     No data to display

## 2022-02-12 NOTE — Progress Notes (Signed)
Physical Therapy Discharge Patient Details Name: Gene Hunter MRN: [id number] DOB: 10/14/1925 Today's Date: 02/12/2022 Time:  -     Patient discharged from PT services secondary to  pt transitioning to comfort care and home with hospice .  Please see latest therapy progress note for current level of functioning and progress toward goals.    Progress and discharge plan discussed with patient and/or caregiver: Patient unable to participate in discharge planning and no caregivers available  GP     Shary Decamp [facility] 02/12/2022, 9:47 AM  [place] Office [phone]

## 2022-02-12 NOTE — Consult Note (Addendum)
Palliative Medicine Inpatient Consult Note  Consulting Provider: Eugenie Filler, MD  Reason for consult:   [facility] Consult  Reason for Consult? goals of care   02/12/2022  HPI:  Per intake H&P --> 88 y/o M with dementia, DNR as of 2020  on eliquis for atrial fibrillation, HTN, Hyperlipidemia ,Admitted 02/08/2022 after  fall down 3f flight of stairs. Found down with Multiple abrasions. Palliative care has been asked to further address goals of care in the setting of frail health state, multiple comorbidities, and dysphagia.   Clinical Assessment/Goals of Care:  *Please note that this is a verbal dictation therefore any spelling or grammatical errors are due to the "[place]" system interpretation.  I have reviewed medical records including EPIC notes, labs and imaging, received report from bedside RN, assessed the patient.    I met with CBryneand his daughter EDorian Podto further discuss diagnosis prognosis, GOC, EOL wishes, disposition and options.   I introduced Palliative Medicine as specialized medical care for people living with serious illness. It focuses on providing relief from the symptoms and stress of a serious illness. The goal is to improve quality of life for both the patient and the family.  Medical History Review and Understanding:  Traveion's past medical history includes atrial fibrillation, dementia, hypertension, right eye blindness, and BPH.  Social History:  [place]  He is a widower.  He has 1 daughter and 1 son who sadly passed away.  He has 3 grandsons and 1 granddaughter as well as 4 great granddaughters.  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  He is a man of faith and used to belong to the [facility] though his church has begun to separate from  that  Functional and Nutritional State:  CYengwas living independently prior to admission.  Per his daughter there were cameras all over the house which she and her sister-in-law would check regularly.  Advance Directives:  A detailed discussion was had today regarding advanced directives.  A copy of these have been retrieved and will be scanned into the electronic medical record system.  Code Status:  CMarstonis an established DNAR/DNI CODE STATUS.  Discussion:  We reviewed the circumstances leading up to CNorveltadmission inclusive of his fall down the stairs.  His daughter shares that he had been seeing and mentioning his mother as of lately as well as other loved ones.  We talked about how this often happens as patients are encroaching their final journey on earth.  JBlanch Mediais very realistic and understands that this will likely be the final phase of Jaidin's life.    We discussed what his wishes would be and she feels strongly he would want to go home.  We talked about hospice in the home and the services provided in brief.  I shared that if we would like for him to go home today may be the day to make that happen.  We reviewed if anything should happen before then or he declines further we could change that plan.  We talked about transition to comfort measures in house and what that would entail inclusive of medications to control pain, dyspnea, agitation, nausea, itching, and hiccups.  We discussed stopping all uneccessary measures such as cardiac monitoring, blood draws, needle sticks, and frequent vital signs.  I described hospice as a service for patients who have a life expectancy of 6 months or less. The goal of hospice is the preservation of dignity and quality at the end phases of life. Under hospice care, the focus changes from curative to symptom relief.   Decision MakerConley Canal Daughter [place] HCPOA    SUMMARY OF RECOMMENDATIONS    DNAR/DNI  Comfort care  Low-dose as needed medications for agitation and dyspnea/pain have been ordered  Unrestricted visitation  Appreciate the transitions of care team helping to coordinate home hospice  Palliative care will continue to follow until discharge  Code Status/Advance Care Planning: DNAR/DNI  Palliative Prophylaxis:  Aspiration, Bowel Regimen, Delirium Protocol, Frequent Pain Assessment, Oral Care, Palliative Wound Care, and Turn Reposition  Additional Recommendations (Limitations, Scope, Preferences): Comfort focused care  Psycho-social/Spiritual:  Desire for further Chaplaincy support: Yes patient is of the [facility] denomination Additional Recommendations:    Prognosis: Limited days to weeks  Discharge Planning: Discharge to home with home hospice once everything is arranged  Vitals:   02/12/22 0541 02/12/22 0546  BP: 98/65   Pulse: 67   Resp: (!) 21   Temp: (!) 95.9 F (35.5 C) (!) 97.4 F (36.3 C)  SpO2: 100%     Intake/Output Summary (Last 24 hours) at 02/12/2022 0923 Last data filed at 02/12/2022 0400 Gross per 24 hour  Intake 915.33 ml  Output 225 ml  Net 690.33 ml   Last Weight  Most recent update: 02/12/2022  5:43 AM    Weight  68.9 kg (151 lb 14.4 oz)            Gen: Elderly frail Caucasian male in no acute distress HEENT: Dry mucous membranes CV: Regular rate and rhythm PULM: On 2 L nasal cannula ABD: soft/nontender EXT: No edema Neuro: Alert and oriented to self  PPS: 10%   This conversation/these recommendations were discussed with patient primary care team, Dr. Grandville Silos  Billing based on MDM: High  Problems Addressed: One acute or chronic illness or injury that poses a threat to life or bodily function  Amount and/or Complexity of Data: Category 3:Discussion of management or test interpretation with external physician/other qualified health care professional/appropriate source (not separately reported)  Risks: Decision  not to resuscitate or to de-escalate care because of poor prognosis ______________________________________________________ [place] Team Team Cell Phone: [phone] Please utilize secure chat with additional questions, if there is no response within 30 minutes please call the above phone number  Palliative Medicine Team providers are available by phone from 7am to 7pm daily and can be reached through the team cell phone.  Should this patient require assistance outside of these hours, please call the patient's attending physician.

## 2022-02-12 NOTE — Progress Notes (Signed)
SLP Cancellation Note  Patient Details Name: Gene Hunter MRN: [id number] DOB: 01/14/1926   Cancelled treatment:       Reason Eval/Treat Not Completed: SLP screened, no needs identified, will sign off ([name] family has decided on comfort care and SLP orders have been cancelled by PMT.)  [name] I. Hardin Negus, [place], [place] Office number [phone] Pager [place] 02/12/2022, 12:46 PM

## 2022-02-12 NOTE — Care Management Important Message (Signed)
Important Message  Patient Details  Name: BROOKS KINNAN MRN: [id number] Date of Birth: 1926-03-19   Medicare Important Message Given:  Yes     Shelda Altes 02/12/2022, 9:49 AM

## 2022-02-12 NOTE — Plan of Care (Signed)
?  Problem: Coping: ?Goal: Level of anxiety will decrease ?Outcome: Progressing ?  ?Problem: Safety: ?Goal: Ability to remain free from injury will improve ?Outcome: Progressing ?  ?

## 2022-02-13 DIAGNOSIS — Z515 Encounter for palliative care: Secondary | ICD-10-CM | POA: Diagnosis not present

## 2022-02-13 DIAGNOSIS — Z7189 Other specified counseling: Secondary | ICD-10-CM | POA: Diagnosis not present

## 2022-02-13 DIAGNOSIS — S32010G Wedge compression fracture of first lumbar vertebra, subsequent encounter for fracture with delayed healing: Secondary | ICD-10-CM

## 2022-02-13 DIAGNOSIS — W19XXXS Unspecified fall, sequela: Secondary | ICD-10-CM | POA: Diagnosis not present

## 2022-02-13 DIAGNOSIS — G9341 Metabolic encephalopathy: Secondary | ICD-10-CM | POA: Diagnosis not present

## 2022-02-13 DIAGNOSIS — D62 Acute posthemorrhagic anemia: Secondary | ICD-10-CM | POA: Diagnosis not present

## 2022-02-13 DIAGNOSIS — Z66 Do not resuscitate: Secondary | ICD-10-CM | POA: Diagnosis not present

## 2022-02-13 DIAGNOSIS — R338 Other retention of urine: Secondary | ICD-10-CM | POA: Diagnosis not present

## 2022-02-13 LAB — CULTURE, BLOOD (ROUTINE X 2)
Culture: NO GROWTH
Culture: NO GROWTH
Special Requests: ADEQUATE

## 2022-02-13 MED ORDER — POLYVINYL ALCOHOL 1.4 % OP SOLN: 1.0000 [drp] | Freq: Four times a day (QID) | OPHTHALMIC | 0 refills | Status: AC | PRN

## 2022-02-13 MED ORDER — ONDANSETRON 4 MG PO TBDP: 4.0000 mg | ORAL_TABLET | Freq: Four times a day (QID) | ORAL | 0 refills | Status: AC | PRN

## 2022-02-13 MED ORDER — ORAL CARE MOUTH RINSE: 15.0000 mL | Freq: Four times a day (QID) | OROMUCOSAL | 0 refills | Status: AC

## 2022-02-13 MED ORDER — OLANZAPINE 5 MG PO TBDP: 2.5000 mg | ORAL_TABLET | Freq: Every day | ORAL | 0 refills | Status: AC

## 2022-02-13 MED ORDER — GLYCOPYRROLATE 1 MG PO TABS: 1.0000 mg | ORAL_TABLET | ORAL | 0 refills | Status: AC | PRN

## 2022-02-13 MED ORDER — MORPHINE SULFATE 10 MG/5ML PO SOLN: 5.0000 mg | ORAL | 0 refills | Status: AC | PRN

## 2022-02-13 NOTE — TOC Transition Note (Signed)
Transition of Care [facility]) - CM/SW Discharge Note   Patient Details  Name: Gene Hunter MRN: [id number] Date of Birth: Mar 26, 1926  Transition of [facility] CM/SW Contact:  Joanne Chars, LCSW Phone Number: 02/13/2022, 10:56 AM   Clinical Narrative:   Pt discharging home with [facility] home hospice services.  PTAR contacted for transport.      Final next level of care: Home w Hospice Care Barriers to Discharge: Barriers Resolved   Patient Goals and CMS Choice        Discharge Placement              Patient chooses bed at:  (Home) Patient to be transferred to facility by: [place] Name of family member notified: daughter [name] in room Patient and family notified of of transfer: 02/13/22  Discharge Plan and Services                                     Social Determinants of Health ([place]) Interventions     Readmission Risk Interventions     No data to display

## 2022-02-13 NOTE — Discharge Summary (Signed)
Physician Discharge Summary  Gene Hunter:[id number] DOB: [birth year] DOA: 02/08/2022  PCP: Gene Pretty, MD  Admit date: 02/08/2022 Discharge date: 02/13/2022  Time spent: 55 minutes  Recommendations for Outpatient Follow-up:  Patient was discharged home with hospice.  Follow-up with hospice MD.   Discharge Diagnoses:  Principal Problem:   Fall Active Problems:   Pressure injury of skin   Gross hematuria   Atrial fibrillation, chronic (HCC)   Hypothermia   Acute blood loss anemia   Acute urinary retention   Dysphagia   Acute metabolic encephalopathy   Compression fracture of L1 lumbar vertebra [facility])   Hospice care patient   Discharge Condition: Stable  Diet recommendation: Comfort feeds  Filed Weights   02/09/22 0500 02/10/22 1436 02/12/22 0541  Weight: 68.1 kg 67.8 kg 68.9 kg    History of present illness:  HPI per Dr Erin Fulling 88 y/o M with dementia, DNR as of 2020  on eliquis for atrial fibrillation, HTN, Hyperlipidemia ,Admitted 02/08/2022 after  fall down 48ft flight of stairs. Found down with Multiple abrasions. Hypotensive, hypothermic. Hgb 7's, WBC is 5.6, Platelets are 113,  got 2L IVF, on 1st unit of blood. FOBT neg, FAST neg. Pan imaging  revealed Mild pulmonary edema with bilateral trace to small volume pleural Effusions, A 20 x 4.5 x 20 cm bilateral lower back soft tissue hematoma formation. Acute L5 compression and L1 complete burst fracture. L1 vertebral body with at least 45% vertebral body height loss and 2 mm retropulsion into the central canal..  CTH and CT C-spine negative for acute traumatic injury. Per family, patient has been hallucinating and talking to his deceased wife for the last 5 days. Per ED notes, there is concern for sepsis, patient has been started on Vanc and Cefepime, which has subsequently been discontinued, as he is afebrile and WBC is normal. In the ED CK was 234. Troponin was 105, BUN 25, Creatinine 0.80, Total Protein 4.8, Albumin 2.7.   Lactate 1.7   PCCM have been called to admit for hypotension and manage care.   Hospital Course:  1 traumatic fall on blood thinners/lower back hematoma -Patient noted to have traumatic fall on blood thinners noted to have a lower back hematoma with anemia hemoglobin as low as 7.1. -Patient noted to have been on Eliquis on admission and received [facility] -CT head CT C-spine negative for any acute traumatic injury. -Patient's blood pressure responded with transfusion per PCCM.  Anticoagulation held and discontinued. -Hemoglobin remained stable at 7.9.   -Patient was seen and followed by trauma surgery during the hospitalization.   -Palliative care met with family and decision made to transition to full comfort measures.   -Patient will be discharged home with hospice.  2.  Acute blood loss anemia/anemia -Patient noted to have traumatic fall with lower back hematoma hemoglobin as low as 7.1 -Anemia panel consistent with iron deficiency anemia/anemia of chronic disease. -Hemoglobin stabilized at 7.9 by day of discharge.   -Patient seen by palliative care and patient transition to full comfort measures.    3.  A-fib on Eliquis  -Status post Kcentra 02/08/2022. -Due to patient's dementia, recent fall, anticoagulation was discontinued during the hospitalization.   -Patient seen by palliative care, decision made to transition to full comfort measures and as such anticoagulation will be discontinued on discharge.   -Patient will be discharged home with hospice.   4.  Hypothermia/rule out sepsis -Patient pancultured. -Urinalysis ordered and urine cultures with no growth to date. -Patient remained  afebrile.   5.  Hypothyroidism -Synthroid resumed. -Patient seen by palliative care and transition to full comfort measures. -Synthroid will be discontinued on discharge as patient being discharged home with hospice.  6.  Urinary retention/hematuria -Patient noted with urinary retention, Foley  catheter placed which was likely traumatic as patient noted to have significant hematuria.   -Hematuria cleared with Foley catheter irrigation.   -Urine cultures negative.   -Patient seen by palliative care and transition to full comfort measures. -Patient will be discharged home with Foley catheter.   7.  BPH -Patient maintained on home regimen alfuzosin.  8.?  Dementia/?  Ability to live alone -SW/PT/OT consults placed. -Patient noted to be deteriorating, with dysphagia, decreased mental status. -Patient placed on Zyprexa daily which will be discharged on with some improvement with hallucinations.   -Patient seen by palliative care decision made to transition to full comfort measures.   -Patient was discharged home with hospice following.    9.  Acute metabolic encephalopathy -Per family patient noted to have some hallucinations and talking to his deceased wife for 5 days prior to admission. -Patient with history of dementia, likely worsening with some associated dysphagia. -Urinalysis noted to be turbid, red, WBC  21-50. -Urine cultures with no growth to date. -TSH at 9.960 patient resumed back on Synthroid. -Folate level of 13.3. -Vitamin B12 at 1387. -Patient was seen by palliative care, decision made to transition to full comfort measures.   Patient noted to be more alert, following some commands and communicative on day of discharge.   -Patient was discharged home with hospice following.  10.  Dysphagia -Patient seen by SLP, patient underwent modified barium swallow on 02/11/2022 and noted to be high aspiration risk and as such patient made n.p.o. pending palliative care evaluation.   -Palliative care consulted on patient and decision made to transition to full comfort measures.  Patient with discharge home with comfort feeds.   11.  Acute compression fractures of L1 and L5 -Noted on scans on presentation with 45% vertebral height loss of L1 and some retropulsion into the  canal. -Patient seen in consultation by neurosurgery who stated given his age and risk of surgery do not think patient is a surgical candidate at this time and recommending LSO brace when ambulating out of bed with outpatient follow-up with neurosurgery. -Patient continued to deteriorate, palliative care met with family and decision made to transition to full comfort measures.  12.  Pressure injury, POA Pressure Injury 02/08/22 Buttocks Right;Left Stage 1 -  Intact skin with non-blanchable redness of a localized area usually over a bony prominence. (Active)  02/08/22 2100  Location: Buttocks  Location Orientation: Right;Left  Staging: Stage 1 -  Intact skin with non-blanchable redness of a localized area usually over a bony prominence.  Wound Description (Comments):   Present on Admission: Yes     13.  Prognosis -Patient with a poor prognosis, and deteriorating.  -Patient with dementia, worsening dysphagia, noted to have fallen with hematomas with some acute metabolic encephalopathy. -Patient n.p.o., high aspiration risk, significantly poor oral intake. -In discussions with family seems may be leaning towards comfort measures. -Palliative care following for outpatient consultation with family and decision was made to transition to full comfort measures.  Patient will be discharged home with hospice.       Procedures: CT head/CT maxillofacial/CT C-spine 02/08/2022 CT chest abdomen and pelvis 02/08/2022 Chest x-ray 02/08/2022 Plain films of the pelvis 02/08/2022 2D echo 02/09/2022  Consultations: General surgery: Dr.  Georganna Skeans 02/08/2022 Neurosurgery: Dr. Ronnald Ramp 02/08/2022 PCCM admission Palliative care: Tacey Ruiz, NP 02/12/2022  Discharge Exam: Vitals:   02/12/22 1030 02/13/22 0445  BP: 116/66 117/64  Pulse: 70 (!) 58  Resp: (!) 23   Temp: (!) 97.3 F (36.3 C) 98 F (36.7 C)  SpO2: 100% 100%    General: NAD Cardiovascular: Bradycardia Respiratory: CTAB anterior lung  fields.  Discharge Instructions   Discharge Instructions     Diet general   Complete by: As directed    Comfort feeds   Discharge wound care:   Complete by: As directed    Pressure Injury 02/08/22 Buttocks Right;Left Stage 1 -  Intact skin with non-blanchable redness of a localized area usually over a bony prominence.   Discharge wound care:   Complete by: As directed    See above.   Increase activity slowly   Complete by: As directed    Increase activity slowly   Complete by: As directed       Allergies as of 02/13/2022       Reactions   Bee Venom Swelling, Other (See Comments)   All-over body swelling    Milk-related Compounds Rash   Other Other (See Comments)   SKIN IS VERY THIN!!! TEARS AND BRUISES VERY EASILY!!!   Coricidin D Cold-flu-sinus [chlorphen-pe-acetaminophen] Other (See Comments)   Reaction not noted   Coricidin Hbp Congestion-cough [dextromethorphan-guaifenesin] Other (See Comments)   Reaction not noted   Hydrochlorothiazide W-triamterene Palpitations        Medication List     STOP taking these medications    atorvastatin 20 MG tablet Commonly known as: LIPITOR   Calcium 600-400 MG-UNIT Chew   calcium carbonate 1500 (600 Ca) MG Tabs tablet Commonly known as: OSCAL   cholecalciferol 1000 units tablet Commonly known as: VITAMIN D   Eliquis 5 MG Tabs tablet Generic drug: apixaban   levothyroxine 150 MCG tablet Commonly known as: SYNTHROID   multivitamin with minerals tablet       TAKE these medications    acetaminophen 325 MG tablet Commonly known as: TYLENOL Take 325-650 mg by mouth every 6 (six) hours as needed for mild pain or headache.   alfuzosin 10 MG 24 hr tablet Commonly known as: UROXATRAL Take 10 mg by mouth daily.   docusate sodium 100 MG capsule Commonly known as: COLACE Take 100 mg by mouth daily.   glycopyrrolate 1 MG tablet Commonly known as: ROBINUL Take 1 tablet (1 mg total) by mouth every 4 (four) hours  as needed (excessive secretions).   morphine 10 MG/5ML solution Take 2.5 mLs (5 mg total) by mouth every 2 (two) hours as needed (Dyspnea/Pain).   mouth rinse Liqd solution 15 mLs by Mouth Rinse route 4 (four) times daily.   OLANZapine zydis 5 MG disintegrating tablet Commonly known as: ZYPREXA Take 0.5 tablets (2.5 mg total) by mouth daily.   ondansetron 4 MG disintegrating tablet Commonly known as: ZOFRAN-ODT Take 1 tablet (4 mg total) by mouth every 6 (six) hours as needed for nausea.   polyvinyl alcohol 1.4 % ophthalmic solution Commonly known as: LIQUIFILM TEARS Place 1 drop into both eyes 4 (four) times daily as needed for dry eyes.               Discharge Care Instructions  (From admission, onward)           Start     Ordered   02/13/22 0000  Discharge wound care:  Comments: Pressure Injury 02/08/22 Buttocks Right;Left Stage 1 -  Intact skin with non-blanchable redness of a localized area usually over a bony prominence.   02/13/22 1019   02/13/22 0000  Discharge wound care:       Comments: See above.   02/13/22 1030           Allergies  Allergen Reactions   Bee Venom Swelling and Other (See Comments)    All-over body swelling    Milk-Related Compounds Rash   Other Other (See Comments)    SKIN IS VERY THIN!!! TEARS AND BRUISES VERY EASILY!!!   Coricidin D Cold-Flu-Sinus [Chlorphen-Pe-Acetaminophen] Other (See Comments)    Reaction not noted   Coricidin Hbp Congestion-Cough [Dextromethorphan-Guaifenesin] Other (See Comments)    Reaction not noted   Hydrochlorothiazide W-Triamterene Palpitations    Follow-up Information     Buford Dresser, MD .   Specialty: Cardiology Contact information: [address] [place] [address] [phone]         Hospice MD Follow up.                   The results of significant diagnostics from this hospitalization (including imaging, microbiology, ancillary and  laboratory) are listed below for reference.    Significant Diagnostic Studies: DG Swallowing Func-Speech Pathology  Result Date: 02/11/2022 Table formatting from the original result was not included. Objective Swallowing Evaluation: Type of Study: MBS-Modified Barium Swallow Study  Patient Details Name: Gene Hunter MRN: [id number] Date of Birth: [birth year] Today's Date: 02/11/2022 Time: SLP Start Time (ACUTE ONLY): 1250 -SLP Stop Time (ACUTE ONLY): 2703 SLP Time Calculation (min) (ACUTE ONLY): 18 min Past Medical History: Past Medical History: Diagnosis Date  Blindness of right eye   BPH (benign prostatic hyperplasia)   Essential hypertension   HLD (hyperlipidemia)   Hypothyroidism  Past Surgical History: Past Surgical History: Procedure Laterality Date  bilateral hip replacement    ESOPHAGEAL MANOMETRY N/A 11/21/2017  Procedure: ESOPHAGEAL MANOMETRY (EM);  Surgeon: Carol Ada, MD;  Location: [facility];  Service: Endoscopy;  Laterality: N/A;  HERNIA REPAIR    JOINT REPLACEMENT    right knee replacement   HPI: Pt is a 88 yo male presenting 7/3 s/p fall at home down a flight of stairs. CTH negative for acute findings; CT CAP w/ L5 compression fx and L1 incomplete burst fx. PMH includes: dementia, blindness of L eye, BPH, HTN, hypothyroidism  Subjective: pt alert, confused  Recommendations for follow up therapy are one component of a multi-disciplinary discharge planning process, led by the attending physician.  Recommendations may be updated based on patient status, additional functional criteria and insurance authorization. Assessment / Plan / Recommendation   02/11/2022   1:14 PM Clinical Impressions Clinical Impression Pt presents with oropharyngeal dysphagia characterized by impaired bolus cohesion, a pharyngeal delay, and reduction in tongue base retraction, hyolaryngeal elevation, anterior laryngeal movement, and pharyngeal constriction. He demonstrated incomplete epiglottic inversion and laryngeal  closure, as well as moderate to significant vallecular residue, posterior pharyngeal wall residue, and pyriform sinus residue. Residue was improved slightly with a liquid wash. PES relaxation was minimal and likely impacted by inadequate hyolaryngeal movement. Only minimal amounts of puree were able to transit past the pharynx despite use of liquid washes. The initial bolus of thin liquids via spoon remained in the pt's pharynx and swallowing was not initiated despite verbal prompts and tactile cues. Penetration (PAS 5) and aspiration (PAS 7) of thin liquids, nectar thick liquids and puree were  noted. [name] throat clearing/cough were only effective in expelling aspirate when material was just inferior to the vocal folds. Complete epiglottic movement was observed once with consecutive swallows of thin liquids when a chin tuck posture was used, but this could not be replicated and pt was resistant to SLP using tactile cues to encourage his use of a chin tuck posture. A chin tuck improved laryngeal invasion to PAS 5 during the swallow with nectar thick liquids, but aspiration still occured before and after deglutition. Pt is at notable risk of aspiration before, during and after deglutition. He was unable to implement multiple compensatory strategies due to his current mentation; however, those which could be used were not significantly effective in reducing risk. An NPO status is recommended at this time; however, should family wish to initiate a p.o. diet with known risk of aspiration, a clear liquids (nectar thick) diet would be recommended with strict observance of swallowing precautions. SLP Visit Diagnosis Dysphagia, oropharyngeal phase (R13.12) Impact on safety and function Severe aspiration risk;Risk for inadequate nutrition/hydration     02/11/2022   1:14 PM Treatment Recommendations Treatment Recommendations Therapy as outlined in treatment plan below     02/11/2022   1:14 PM Prognosis Prognosis for Safe Diet  Advancement Fair Barriers to Reach Goals Cognitive deficits;Severity of deficits   02/11/2022   1:14 PM Diet Recommendations SLP Diet Recommendations NPO;Alternative means - temporary Liquid Administration via Cup;Spoon Medication Administration Via alternative means Compensations Minimize environmental distractions;Slow rate;Small sips/bites;Multiple dry swallows after each bite/sip;Chin tuck Postural Changes Seated upright at 90 degrees;Remain semi-upright after after feeds/meals (Comment)     02/11/2022   1:14 PM Other Recommendations Oral Care Recommendations Oral care QID Follow Up Recommendations Skilled nursing-short term rehab (<3 hours/day) Assistance recommended at discharge Frequent or constant Supervision/Assistance Functional Status Assessment Patient has had a recent decline in their functional status and demonstrates the ability to make significant improvements in function in a reasonable and predictable amount of time.   02/11/2022   1:14 PM Frequency and Duration  Speech Therapy Frequency (ACUTE ONLY) min 2x/week Treatment Duration 2 weeks     02/11/2022   1:14 PM Oral Phase Oral Phase Impaired Oral - Nectar Straw Decreased bolus cohesion;Premature spillage Oral - Thin Teaspoon Decreased bolus cohesion;Premature spillage Oral - Thin Straw Decreased bolus cohesion;Premature spillage Oral - Puree Weak lingual manipulation    02/11/2022   1:14 PM Pharyngeal Phase Pharyngeal Phase Impaired Pharyngeal- Nectar Straw Reduced pharyngeal peristalsis;Reduced epiglottic inversion;Reduced anterior laryngeal mobility;Reduced laryngeal elevation;Reduced airway/laryngeal closure;Reduced tongue base retraction;Penetration/Aspiration before swallow;Penetration/Aspiration during swallow;Penetration/Apiration after swallow;Moderate aspiration;Pharyngeal residue - valleculae;Pharyngeal residue - pyriform;Pharyngeal residue - posterior pharnyx Pharyngeal Material enters airway, CONTACTS cords and not ejected out;Material enters  airway, passes BELOW cords and not ejected out despite cough attempt by patient Pharyngeal- Thin Teaspoon Reduced pharyngeal peristalsis;Reduced epiglottic inversion;Reduced anterior laryngeal mobility;Reduced laryngeal elevation;Reduced airway/laryngeal closure;Reduced tongue base retraction;Penetration/Aspiration before swallow;Penetration/Aspiration during swallow;Penetration/Apiration after swallow;Pharyngeal residue - valleculae;Pharyngeal residue - pyriform;Pharyngeal residue - posterior pharnyx;Trace aspiration Pharyngeal Material enters airway, passes BELOW cords and not ejected out despite cough attempt by patient Pharyngeal- Thin Straw Reduced pharyngeal peristalsis;Reduced epiglottic inversion;Reduced anterior laryngeal mobility;Reduced laryngeal elevation;Reduced airway/laryngeal closure;Reduced tongue base retraction;Penetration/Aspiration before swallow;Penetration/Aspiration during swallow;Penetration/Apiration after swallow;Moderate aspiration;Pharyngeal residue - valleculae;Pharyngeal residue - pyriform;Pharyngeal residue - posterior pharnyx Pharyngeal Material enters airway, CONTACTS cords and not ejected out;Material enters airway, passes BELOW cords and not ejected out despite cough attempt by patient Pharyngeal- Puree Reduced pharyngeal peristalsis;Reduced epiglottic inversion;Reduced anterior laryngeal mobility;Reduced laryngeal elevation;Reduced airway/laryngeal  closure;Reduced tongue base retraction;Penetration/Apiration after swallow;Pharyngeal residue - valleculae;Pharyngeal residue - pyriform;Pharyngeal residue - posterior pharnyx Pharyngeal Material enters airway, passes BELOW cords and not ejected out despite cough attempt by patient    02/11/2022   1:14 PM Cervical Esophageal Phase  Cervical Esophageal Phase Impaired Nectar Teaspoon Reduced cricopharyngeal relaxation Nectar Straw Reduced cricopharyngeal relaxation Thin Teaspoon Reduced cricopharyngeal relaxation Thin Straw Reduced  cricopharyngeal relaxation Puree Reduced cricopharyngeal relaxation Cervical Esophageal Comment Reduced cricopharyngeal relaxation; Shanika I. Hardin Negus, [place], [place] Office number [phone] Pager [phone] Horton Marshall 02/11/2022, 2:16 PM                     ECHOCARDIOGRAM COMPLETE  Result Date: 02/09/2022    ECHOCARDIOGRAM REPORT   Patient Name:   Gene Hunter Date of Exam: 02/09/2022 Medical Rec #:  [id number]      Height:       72.0 in Accession #:    [id number]     Weight:       150.1 lb Date of Birth:  Feb 07, 1926     BSA:          1.886 m Patient Age:    85 years       BP:           88/58 mmHg Patient Gender: M              HR:           79 bpm. Exam Location:  Inpatient Procedure: 2D Echo, Color Doppler and Cardiac Doppler Indications:    elevated troponin  History:        Patient has prior history of Echocardiogram examinations, most                 recent 10/09/2018. Risk Factors:Hypertension and Dyslipidemia.  Sonographer:    Melissa Morford RDCS (AE, PE) Referring Phys: [place]  1. Left ventricular ejection fraction, by estimation, is 50 to 55%. The left ventricle has low normal function. The left ventricle has no regional wall motion abnormalities. There is moderate left ventricular hypertrophy. Left ventricular diastolic parameters are indeterminate.  2. Right ventricular systolic function is normal. The right ventricular size is mildly enlarged.  3. The mitral valve is normal in structure. Trivial mitral valve regurgitation. No evidence of mitral stenosis.  4. The aortic valve is normal in structure. There is moderate calcification of the aortic valve. There is moderate thickening of the aortic valve. Aortic valve regurgitation is not visualized. Aortic valve sclerosis/calcification is present, without any  evidence of aortic stenosis.  5. The inferior vena cava is normal in size with greater than 50% respiratory variability, suggesting  right atrial pressure of 3 mmHg. FINDINGS  Left Ventricle: Left ventricular ejection fraction, by estimation, is 50 to 55%. The left ventricle has low normal function. The left ventricle has no regional wall motion abnormalities. The left ventricular internal cavity size was normal in size. There is moderate left ventricular hypertrophy. Left ventricular diastolic parameters are indeterminate. Right Ventricle: The right ventricular size is mildly enlarged. No increase in right ventricular wall thickness. Right ventricular systolic function is normal. Left Atrium: Left atrial size was normal in size. Right Atrium: Right atrial size was normal in size. Pericardium: There is no evidence of pericardial effusion. Mitral Valve: The mitral valve is normal in structure. Trivial mitral valve regurgitation. No evidence of mitral valve stenosis. Tricuspid Valve: The tricuspid valve is normal in structure. Tricuspid valve  regurgitation is not demonstrated. No evidence of tricuspid stenosis. Aortic Valve: The aortic valve is normal in structure. There is moderate calcification of the aortic valve. There is moderate thickening of the aortic valve. Aortic valve regurgitation is not visualized. Aortic valve sclerosis/calcification is present, without any evidence of aortic stenosis. Aortic valve mean gradient measures 9.5 mmHg. Aortic valve peak gradient measures 13.5 mmHg. Aortic valve area, by VTI measures 1.32 cm. Pulmonic Valve: The pulmonic valve was normal in structure. Pulmonic valve regurgitation is trivial. No evidence of pulmonic stenosis. Aorta: The aortic root is normal in size and structure. Venous: The inferior vena cava is normal in size with greater than 50% respiratory variability, suggesting right atrial pressure of 3 mmHg. IAS/Shunts: No atrial level shunt detected by color flow Doppler.  LEFT VENTRICLE PLAX 2D LVIDd:         3.90 cm LVIDs:         3.10 cm LV PW:         1.60 cm LV IVS:        1.50 cm LVOT diam:      2.10 cm LV SV:         52 LV SV Index:   27 LVOT Area:     3.46 cm  RIGHT VENTRICLE TAPSE (M-mode): 1.0 cm LEFT ATRIUM             Index        RIGHT ATRIUM           Index LA diam:        3.60 cm 1.91 cm/m   RA Area:     24.40 cm LA Vol (A2C):   49.8 ml 26.40 ml/m  RA Volume:   84.10 ml  44.59 ml/m LA Vol (A4C):   67.9 ml 36.00 ml/m LA Biplane Vol: 58.3 ml 30.91 ml/m  AORTIC VALVE AV Area (Vmax):    1.34 cm AV Area (Vmean):   1.25 cm AV Area (VTI):     1.32 cm AV Vmax:           183.50 cm/s AV Vmean:          148.000 cm/s AV VTI:            0.390 m AV Peak Grad:      13.5 mmHg AV Mean Grad:      9.5 mmHg LVOT Vmax:         71.10 cm/s LVOT Vmean:        53.200 cm/s LVOT VTI:          0.149 m LVOT/AV VTI ratio: 0.38  AORTA Ao Root diam: 3.25 cm Ao Asc diam:  3.50 cm MITRAL VALVE               TRICUSPID VALVE MV Area (PHT): 5.13 cm    TR Peak grad:   18.3 mmHg MV Decel Time: 148 msec    TR Vmax:        214.00 cm/s MV E velocity: 99.40 cm/s                            SHUNTS                            Systemic VTI:  0.15 m  Systemic Diam: 2.10 cm Candee Furbish MD Electronically signed by Candee Furbish MD Signature Date/Time: 02/09/2022/1:25:59 PM    Final    CT CHEST ABDOMEN PELVIS W CONTRAST  Result Date: 02/08/2022 CLINICAL DATA:  Chest trauma, blunt. Fall. pt found at the bottom of the stair case EXAM: CT CHEST, ABDOMEN, AND PELVIS WITH CONTRAST TECHNIQUE: Multidetector CT imaging of the chest, abdomen and pelvis was performed following the standard protocol during bolus administration of intravenous contrast. RADIATION DOSE REDUCTION: This exam was performed according to the departmental dose-optimization program which includes automated exposure control, adjustment of the mA and/or kV according to patient size and/or use of iterative reconstruction technique. CONTRAST:  76mL OMNIPAQUE IOHEXOL 300 MG/ML  SOLN COMPARISON:  CT hip left 05/05/2017 FINDINGS: CHEST: Ports and  Devices: None. Lungs/airways: Mild bilateral upper lobe interlobular septal wall thickening. No focal consolidation. No pulmonary nodule. No pulmonary mass. No pulmonary contusion or laceration. No pneumatocele formation. The central airways are patent. Pleura: Bilateral trace to small volume pleural effusions. No pneumothorax. No hemothorax. Lymph Nodes: No mediastinal, hilar, or axillary lymphadenopathy. Mediastinum: No pneumomediastinum. No aortic injury or mediastinal hematoma. The thoracic aorta is normal in caliber. Moderate to severe atherosclerotic plaque with aortic valve leaflet calcification and four-vessel coronary calcification. The heart is normal in size. No significant pericardial effusion. The esophagus is unremarkable. The thyroid is unremarkable. Chest Wall / Breasts: No chest wall mass. Musculoskeletal: No acute rib or sternal fracture. Grossly stable chronic T12 compression fracture with at least 35 % vertebral body height loss. Bilateral moderate severe shoulder degenerative changes, left greater than right. ABDOMEN / PELVIS: Liver: Not enlarged. No focal lesion. No laceration or subcapsular hematoma. Biliary System: The gallbladder is otherwise unremarkable with no radio-opaque gallstones. No biliary ductal dilatation. Pancreas: Normal pancreatic contour. No main pancreatic duct dilatation. Spleen: Not enlarged. No focal lesion. No laceration, subcapsular hematoma, or vascular injury. Adrenal Glands: No nodularity bilaterally. Kidneys: Bilateral kidneys enhance symmetrically. No hydronephrosis. No contusion, laceration, or subcapsular hematoma. No injury to the vascular structures or collecting systems. No hydroureter. The urinary bladder is unremarkable. Bowel: No small or large bowel wall thickening or dilatation. Colonic diverticulosis. The appendix is not definitely identified with no inflammatory changes in the right lower quadrant to suggest acute appendicitis. Mesentery, Omentum, and  Peritoneum: No simple free fluid ascites. No pneumoperitoneum. No hemoperitoneum. No mesenteric hematoma identified. No organized fluid collection. Pelvic Organs: Normal. Lymph Nodes: No abdominal, pelvic, inguinal lymphadenopathy. Vasculature: Severe atherosclerotic plaque. No abdominal aorta or iliac aneurysm. No active contrast extravasation or pseudoaneurysm. Musculoskeletal: A 20 x 4.5 x 20 cm bilateral lower back soft tissue hematoma formation. Associated diffuse back subcutaneus soft tissue edema. No definite acute pelvic fracture. Bilateral total hip arthroplasty. Lucency surrounding the left femoral shaft surgical hardware is a chronic finding. Acute L5 compression fracture with at least 35% vertebral body height loss. Acute L1 burst fracture with at least 45% vertebral body height loss and 2 mm retropulsion into the central canal. IMPRESSION: 1. A 20 x 4.5 x 20 cm bilateral lower back soft tissue hematoma formation. 2. Acute L5 compression and L1 complete burst fracture. L1 vertebral body with at least 45% vertebral body height loss and 2 mm retropulsion into the central canal. 3. No acute traumatic injury to the chest, abdomen, or pelvis. 4. Bilateral total hip arthroplasty. Lucency surrounding the left femoral shaft surgical hardware is a chronic finding with loosening or infection not excluded. Other imaging findings of potential clinical significance: 1.  Mild pulmonary edema with bilateral trace to small volume pleural effusions. 2. Chronic stable T12 compression fracture. 3. Aortic Atherosclerosis (ICD10-I70.0) including four-vessel coronary calcification and aortic valve leaflet calcifications-correlate for aortic stenosis. Electronically Signed   By: Iven Finn M.D.   On: 02/08/2022 15:47   CT Head Wo Contrast  Result Date: 02/08/2022 CLINICAL DATA:  Fall down stairs.  Chronic anticoagulation. EXAM: CT HEAD WITHOUT CONTRAST CT MAXILLOFACIAL WITHOUT CONTRAST CT CERVICAL SPINE WITHOUT CONTRAST  TECHNIQUE: Multidetector CT imaging of the head, cervical spine, and maxillofacial structures were performed using the standard protocol without intravenous contrast. Multiplanar CT image reconstructions of the cervical spine and maxillofacial structures were also generated. RADIATION DOSE REDUCTION: This exam was performed according to the departmental dose-optimization program which includes automated exposure control, adjustment of the mA and/or kV according to patient size and/or use of iterative reconstruction technique. COMPARISON:  None Available. FINDINGS: CT HEAD FINDINGS Brain: There is no evidence for acute hemorrhage, hydrocephalus, mass lesion, or abnormal extra-axial fluid collection. No definite CT evidence for acute infarction. Diffuse loss of parenchymal volume is consistent with atrophy. Patchy low attenuation in the deep hemispheric and periventricular white matter is nonspecific, but likely reflects chronic microvascular ischemic demyelination. Vascular: No hyperdense vessel or unexpected calcification. Skull: No evidence for fracture. No worrisome lytic or sclerotic lesion. Other: None. CT MAXILLOFACIAL FINDINGS Osseous: No fracture or mandibular dislocation. No destructive process. Orbits: Negative. No traumatic or inflammatory finding. Sinuses: Mild chronic mucosal thickening left maxillary sinus in scattered ethmoid air cells. No findings to suggest acute sinusitis. Soft tissues: Negative. CT CERVICAL SPINE FINDINGS Alignment: Degenerative retrolisthesis of C4 on 5 noted. Skull base and vertebrae: No acute fracture. No primary bone lesion or focal pathologic process. Soft tissues and spinal canal: No prevertebral fluid or swelling. No visible canal hematoma. Disc levels: Loss of disc height noted C4-5 and C5-6. Mild loss of disc height noted C6-7. Right-sided C5-6 facets are fused. Upper chest: See report for chest CT performed at the same time and dictated separately. Other: None.  IMPRESSION: 1. No acute intracranial abnormality. 2. Atrophy with chronic small vessel ischemic disease. 3. No evidence for facial bone fracture. 4. No evidence for acute cervical spine fracture or subluxation. Degenerative changes in the cervical spine as above. Electronically Signed   By: Misty Stanley M.D.   On: 02/08/2022 15:41   CT Cervical Spine Wo Contrast  Result Date: 02/08/2022 CLINICAL DATA:  Fall down stairs.  Chronic anticoagulation. EXAM: CT HEAD WITHOUT CONTRAST CT MAXILLOFACIAL WITHOUT CONTRAST CT CERVICAL SPINE WITHOUT CONTRAST TECHNIQUE: Multidetector CT imaging of the head, cervical spine, and maxillofacial structures were performed using the standard protocol without intravenous contrast. Multiplanar CT image reconstructions of the cervical spine and maxillofacial structures were also generated. RADIATION DOSE REDUCTION: This exam was performed according to the departmental dose-optimization program which includes automated exposure control, adjustment of the mA and/or kV according to patient size and/or use of iterative reconstruction technique. COMPARISON:  None Available. FINDINGS: CT HEAD FINDINGS Brain: There is no evidence for acute hemorrhage, hydrocephalus, mass lesion, or abnormal extra-axial fluid collection. No definite CT evidence for acute infarction. Diffuse loss of parenchymal volume is consistent with atrophy. Patchy low attenuation in the deep hemispheric and periventricular white matter is nonspecific, but likely reflects chronic microvascular ischemic demyelination. Vascular: No hyperdense vessel or unexpected calcification. Skull: No evidence for fracture. No worrisome lytic or sclerotic lesion. Other: None. CT MAXILLOFACIAL FINDINGS Osseous: No fracture or mandibular dislocation. No destructive process.  Orbits: Negative. No traumatic or inflammatory finding. Sinuses: Mild chronic mucosal thickening left maxillary sinus in scattered ethmoid air cells. No findings to suggest  acute sinusitis. Soft tissues: Negative. CT CERVICAL SPINE FINDINGS Alignment: Degenerative retrolisthesis of C4 on 5 noted. Skull base and vertebrae: No acute fracture. No primary bone lesion or focal pathologic process. Soft tissues and spinal canal: No prevertebral fluid or swelling. No visible canal hematoma. Disc levels: Loss of disc height noted C4-5 and C5-6. Mild loss of disc height noted C6-7. Right-sided C5-6 facets are fused. Upper chest: See report for chest CT performed at the same time and dictated separately. Other: None. IMPRESSION: 1. No acute intracranial abnormality. 2. Atrophy with chronic small vessel ischemic disease. 3. No evidence for facial bone fracture. 4. No evidence for acute cervical spine fracture or subluxation. Degenerative changes in the cervical spine as above. Electronically Signed   By: Misty Stanley M.D.   On: 02/08/2022 15:41   CT Maxillofacial WO CM  Result Date: 02/08/2022 CLINICAL DATA:  Fall down stairs.  Chronic anticoagulation. EXAM: CT HEAD WITHOUT CONTRAST CT MAXILLOFACIAL WITHOUT CONTRAST CT CERVICAL SPINE WITHOUT CONTRAST TECHNIQUE: Multidetector CT imaging of the head, cervical spine, and maxillofacial structures were performed using the standard protocol without intravenous contrast. Multiplanar CT image reconstructions of the cervical spine and maxillofacial structures were also generated. RADIATION DOSE REDUCTION: This exam was performed according to the departmental dose-optimization program which includes automated exposure control, adjustment of the mA and/or kV according to patient size and/or use of iterative reconstruction technique. COMPARISON:  None Available. FINDINGS: CT HEAD FINDINGS Brain: There is no evidence for acute hemorrhage, hydrocephalus, mass lesion, or abnormal extra-axial fluid collection. No definite CT evidence for acute infarction. Diffuse loss of parenchymal volume is consistent with atrophy. Patchy low attenuation in the deep  hemispheric and periventricular white matter is nonspecific, but likely reflects chronic microvascular ischemic demyelination. Vascular: No hyperdense vessel or unexpected calcification. Skull: No evidence for fracture. No worrisome lytic or sclerotic lesion. Other: None. CT MAXILLOFACIAL FINDINGS Osseous: No fracture or mandibular dislocation. No destructive process. Orbits: Negative. No traumatic or inflammatory finding. Sinuses: Mild chronic mucosal thickening left maxillary sinus in scattered ethmoid air cells. No findings to suggest acute sinusitis. Soft tissues: Negative. CT CERVICAL SPINE FINDINGS Alignment: Degenerative retrolisthesis of C4 on 5 noted. Skull base and vertebrae: No acute fracture. No primary bone lesion or focal pathologic process. Soft tissues and spinal canal: No prevertebral fluid or swelling. No visible canal hematoma. Disc levels: Loss of disc height noted C4-5 and C5-6. Mild loss of disc height noted C6-7. Right-sided C5-6 facets are fused. Upper chest: See report for chest CT performed at the same time and dictated separately. Other: None. IMPRESSION: 1. No acute intracranial abnormality. 2. Atrophy with chronic small vessel ischemic disease. 3. No evidence for facial bone fracture. 4. No evidence for acute cervical spine fracture or subluxation. Degenerative changes in the cervical spine as above. Electronically Signed   By: Misty Stanley M.D.   On: 02/08/2022 15:41   DG Pelvis Portable  Result Date: 02/08/2022 CLINICAL DATA:  Fall.  On blood thinners. EXAM: PORTABLE PELVIS 1-2 VIEWS COMPARISON:  09/22/2010 FINDINGS: Status post bilateral hip arthroplasty. Osteopenia. No acute fracture or dislocation. IMPRESSION: No acute osseous abnormality. Electronically Signed   By: Abigail Miyamoto M.D.   On: 02/08/2022 14:02   DG Chest Port 1 View  Result Date: 02/08/2022 CLINICAL DATA:  Fall, on blood thinners, on initial encounter. EXAM: PORTABLE CHEST  1 VIEW COMPARISON:  10/08/2018.  FINDINGS: Trachea is midline. Heart size is enlarged and accentuated by AP portable supine technique. Lungs are clear. Probable left infrahilar atelectasis. No definite pleural fluid or pneumothorax. Osseous structures appear grossly intact but osteopenic. Degenerative changes in the left shoulder. IMPRESSION: Left infrahilar atelectasis. Electronically Signed   By: Lorin Picket M.D.   On: 02/08/2022 14:02    Microbiology: Recent Results (from the past 240 hour(s))  MRSA Next Gen by PCR, Nasal     Status: None   Collection Time: 02/08/22  8:46 PM   Specimen: Nasal Mucosa; Nasal Swab  Result Value Ref Range Status   MRSA by PCR Next Gen NOT DETECTED NOT DETECTED Final    Comment: (NOTE) The GeneXpert MRSA Assay (FDA approved for NASAL specimens only), is one component of a comprehensive MRSA colonization surveillance program. It is not intended to diagnose MRSA infection nor to guide or monitor treatment for MRSA infections. Test performance is not FDA approved in patients less than 76 years old. Performed at [facility], [place] [address], [place], [place] [address]   Culture, blood (routine x 2)     Status: None   Collection Time: 02/08/22  9:12 PM   Specimen: BLOOD  Result Value Ref Range Status   Specimen Description BLOOD RIGHT ANTECUBITAL  Final   Special Requests   Final    BOTTLES DRAWN AEROBIC AND ANAEROBIC Blood Culture adequate volume   Culture   Final    NO GROWTH 5 DAYS Performed at [facility], [place] [address] [place], [place] [address]    Report Status 02/13/2022 FINAL  Final  Culture, blood (routine x 2)     Status: None   Collection Time: 02/08/22  9:12 PM   Specimen: BLOOD  Result Value Ref Range Status   Specimen Description BLOOD BLOOD RIGHT HAND  Final   Special Requests   Final    BOTTLES DRAWN AEROBIC AND ANAEROBIC Blood Culture results may not be optimal due to an inadequate volume of blood received in culture bottles   Culture   Final    NO  GROWTH 5 DAYS Performed at [facility], [place] [address], [place], [place] [address]    Report Status 02/13/2022 FINAL  Final  Urine Culture     Status: None   Collection Time: 02/10/22  8:24 AM   Specimen: Urine, Clean Catch  Result Value Ref Range Status   Specimen Description URINE, CLEAN CATCH  Final   Special Requests NONE  Final   Culture   Final    NO GROWTH Performed at [facility], [place] [address], [place],  [address]    Report Status 02/11/2022 FINAL  Final     Labs: Basic Metabolic Panel: Recent Labs  Lab 02/08/22 1352 02/08/22 1401 02/09/22 0257 02/10/22 0308 02/11/22 0321 02/12/22 0124  NA 141 140 142 142 144 143  K 4.8 4.8 4.5 4.4 4.4 4.5  CL 107 105 108 107 109 112*  CO2 26  --  23 21* 25 25  GLUCOSE 130* 126* 87 76 86 76  BUN 26* 25* 28* 33* 35* 38*  CREATININE 0.93 0.80 0.85 0.98 0.93 1.06  CALCIUM 8.9  --  8.8* 8.9 9.1 8.6*   Liver Function Tests: Recent Labs  Lab 02/08/22 1352  AST 17  ALT 16  ALKPHOS 76  BILITOT 1.0  PROT 4.8*  ALBUMIN 2.7*   No results for input(s): "LIPASE", "AMYLASE"  in the last 168 hours. No results for input(s): "AMMONIA" in the last 168 hours. CBC: Recent Labs  Lab 02/08/22 1352 02/08/22 1401 02/09/22 0257 02/10/22 0308 02/11/22 0321 02/12/22 0124  WBC 5.6  --  8.6 7.1 6.4 5.8  NEUTROABS 4.4  --   --   --   --  4.1  HGB 7.6* 7.1* 9.3* 8.1* 8.0* 7.9*  HCT 24.0* 21.0* 27.6* 24.2* 24.7* 25.5*  MCV 98.0  --  88.5 88.6 91.8 94.1  PLT 113*  --  125* 115* 128* 145*   Cardiac Enzymes: Recent Labs  Lab 02/08/22 1352 02/09/22 0257 02/10/22 0308 02/11/22 0321  CKTOTAL 234 260 305 248   BNP: BNP (last 3 results) No results for input(s): "BNP" in the last 8760 hours.  ProBNP (last 3 results) No results for input(s): "PROBNP" in the last 8760 hours.  CBG: No results for input(s): "GLUCAP" in the last 168 hours.     Signed:  Irine Seal MD.  [facility] 02/13/2022, 10:37  AM

## 2022-02-13 NOTE — Progress Notes (Signed)
Manufacturing engineer (ACC)   Per family report, DME has been delivered and they are prepared for patient to return home today.  If applicable, please send signed and completed DNR with patient/family upon discharge. Please provide prescriptions at discharge as needed to ensure ongoing symptom management and a transport packet.   [facility] information and contact numbers given to family and above information shared with TOC.    Please call with any questions/concerns.    Thank you for the opportunity to participate in this patient's care   Daphene Calamity, MSW [facility] Liaison  [phone]

## 2022-02-13 NOTE — Progress Notes (Signed)
   Palliative Medicine Inpatient Follow Up Note   HPI:  88 y/o M with dementia, DNR as of 2020  on eliquis for atrial fibrillation, HTN, Hyperlipidemia ,Admitted 02/08/2022 after  fall down 56f flight of stairs. Found down with Multiple abrasions. Palliative care has been asked to further address goals of care in the setting of frail health state, multiple comorbidities, and dysphagia.  Today's Discussion 02/13/2022  *Please note that this is a verbal dictation therefore any spelling or grammatical errors are due to the "DElderonOne" system interpretation.  Chart reviewed inclusive of vital signs, progress notes, laboratory results, and diagnostic images.   In the last 24 hours CCashhas not required any morphine or Ativan for symptom control.  It also appears that he did not receive his olanzapine last night though does appear calm upon assessment as below.  Kailo at bedside this morning.  He shares that he had the best night sleep he has had in a long time not needing to worry about any maintenance in his home.  He shares that he is not presently in any pain nor is he having difficulty breathing.  We reviewed the plan for him to transition home today which she is in agreement with.  Plan for CMartinoto transition home with hospice care as early as today, pending DME delivery and ambulance transportation.  Objective Assessment: Vital Signs Vitals:   02/12/22 1030 02/13/22 0445  BP: 116/66 117/64  Pulse: 70 (!) 58  Resp: (!) 23   Temp: (!) 97.3 F (36.3 C) 98 F (36.7 C)  SpO2: 100% 100%    Intake/Output Summary (Last 24 hours) at 02/13/2022 [id number] data filed at 02/13/2022 0830 Gross per 24 hour  Intake 987.5 ml  Output 675 ml  Net 312.5 ml   Last Weight  Most recent update: 02/12/2022  5:43 AM    Weight  68.9 kg (151 lb 14.4 oz)            Gen: Elderly frail Caucasian male in no acute distress HEENT: Dry mucous membranes CV: Regular rate and rhythm PULM: On 2 L nasal  cannula ABD: soft/nontender EXT: No edema Neuro: Alert and oriented to self  SUMMARY OF RECOMMENDATIONS   DNAR/DNI  Goal DNR form placed on the front of the chart   Comfort care   Low-dose as needed medications for agitation and dyspnea/pain have been ordered   Unrestricted visitation   Appreciate the transitions of care team helping to coordinate home hospice   Plan for discharge possibly today if DME are delivered  Billing based on MDM: High  Problems Addressed: One acute or chronic illness or injury that poses a threat to life or bodily function  Amount and/or Complexity of Data: Category 3:Discussion of management or test interpretation with external physician/other qualified health care professional/appropriate source (not separately reported)  Risks: Parenteral controlled substances ______________________________________________________________________________________ [place] Team Cell Phone: [phone] utilize secure chat with additional questions, if there is no response within 30 minutes please call the above phone number  Palliative Medicine Team providers are available by phone from 7am to 7pm daily and can be reached through the team cell phone.  Should this patient require assistance outside of these hours, please call the patient's attending physician.

## 2022-02-14 DIAGNOSIS — L89311 Pressure ulcer of right buttock, stage 1: Secondary | ICD-10-CM | POA: Diagnosis not present

## 2022-02-14 DIAGNOSIS — E785 Hyperlipidemia, unspecified: Secondary | ICD-10-CM | POA: Diagnosis not present

## 2022-02-14 DIAGNOSIS — Z7401 Bed confinement status: Secondary | ICD-10-CM | POA: Diagnosis not present

## 2022-02-14 DIAGNOSIS — L89321 Pressure ulcer of left buttock, stage 1: Secondary | ICD-10-CM | POA: Diagnosis not present

## 2022-02-14 DIAGNOSIS — E43 Unspecified severe protein-calorie malnutrition: Secondary | ICD-10-CM | POA: Diagnosis not present

## 2022-02-14 DIAGNOSIS — N400 Enlarged prostate without lower urinary tract symptoms: Secondary | ICD-10-CM | POA: Diagnosis not present

## 2022-02-14 DIAGNOSIS — E8809 Other disorders of plasma-protein metabolism, not elsewhere classified: Secondary | ICD-10-CM | POA: Diagnosis not present

## 2022-02-14 DIAGNOSIS — R627 Adult failure to thrive: Secondary | ICD-10-CM | POA: Diagnosis not present

## 2022-02-14 DIAGNOSIS — Z741 Need for assistance with personal care: Secondary | ICD-10-CM | POA: Diagnosis not present

## 2022-02-14 DIAGNOSIS — I4891 Unspecified atrial fibrillation: Secondary | ICD-10-CM | POA: Diagnosis not present

## 2022-02-14 DIAGNOSIS — J90 Pleural effusion, not elsewhere classified: Secondary | ICD-10-CM | POA: Diagnosis not present

## 2022-02-14 DIAGNOSIS — E039 Hypothyroidism, unspecified: Secondary | ICD-10-CM | POA: Diagnosis not present

## 2022-02-14 DIAGNOSIS — R159 Full incontinence of feces: Secondary | ICD-10-CM | POA: Diagnosis not present

## 2022-02-14 DIAGNOSIS — R32 Unspecified urinary incontinence: Secondary | ICD-10-CM | POA: Diagnosis not present

## 2022-02-14 DIAGNOSIS — I10 Essential (primary) hypertension: Secondary | ICD-10-CM | POA: Diagnosis not present

## 2022-02-14 DIAGNOSIS — M4856XD Collapsed vertebra, not elsewhere classified, lumbar region, subsequent encounter for fracture with routine healing: Secondary | ICD-10-CM | POA: Diagnosis not present

## 2022-02-15 DIAGNOSIS — E43 Unspecified severe protein-calorie malnutrition: Secondary | ICD-10-CM | POA: Diagnosis not present

## 2022-02-15 DIAGNOSIS — M4856XD Collapsed vertebra, not elsewhere classified, lumbar region, subsequent encounter for fracture with routine healing: Secondary | ICD-10-CM | POA: Diagnosis not present

## 2022-02-15 DIAGNOSIS — E785 Hyperlipidemia, unspecified: Secondary | ICD-10-CM | POA: Diagnosis not present

## 2022-02-15 DIAGNOSIS — I4891 Unspecified atrial fibrillation: Secondary | ICD-10-CM | POA: Diagnosis not present

## 2022-02-15 DIAGNOSIS — J90 Pleural effusion, not elsewhere classified: Secondary | ICD-10-CM | POA: Diagnosis not present

## 2022-02-15 DIAGNOSIS — I10 Essential (primary) hypertension: Secondary | ICD-10-CM | POA: Diagnosis not present

## 2022-02-16 DIAGNOSIS — I4891 Unspecified atrial fibrillation: Secondary | ICD-10-CM | POA: Diagnosis not present

## 2022-02-16 DIAGNOSIS — M4856XD Collapsed vertebra, not elsewhere classified, lumbar region, subsequent encounter for fracture with routine healing: Secondary | ICD-10-CM | POA: Diagnosis not present

## 2022-02-16 DIAGNOSIS — I10 Essential (primary) hypertension: Secondary | ICD-10-CM | POA: Diagnosis not present

## 2022-02-16 DIAGNOSIS — E43 Unspecified severe protein-calorie malnutrition: Secondary | ICD-10-CM | POA: Diagnosis not present

## 2022-02-16 DIAGNOSIS — E785 Hyperlipidemia, unspecified: Secondary | ICD-10-CM | POA: Diagnosis not present

## 2022-02-16 DIAGNOSIS — J90 Pleural effusion, not elsewhere classified: Secondary | ICD-10-CM | POA: Diagnosis not present

## 2022-02-17 DIAGNOSIS — M4856XD Collapsed vertebra, not elsewhere classified, lumbar region, subsequent encounter for fracture with routine healing: Secondary | ICD-10-CM | POA: Diagnosis not present

## 2022-02-17 DIAGNOSIS — J90 Pleural effusion, not elsewhere classified: Secondary | ICD-10-CM | POA: Diagnosis not present

## 2022-02-17 DIAGNOSIS — E43 Unspecified severe protein-calorie malnutrition: Secondary | ICD-10-CM | POA: Diagnosis not present

## 2022-02-17 DIAGNOSIS — I4891 Unspecified atrial fibrillation: Secondary | ICD-10-CM | POA: Diagnosis not present

## 2022-02-17 DIAGNOSIS — I10 Essential (primary) hypertension: Secondary | ICD-10-CM | POA: Diagnosis not present

## 2022-02-17 DIAGNOSIS — E785 Hyperlipidemia, unspecified: Secondary | ICD-10-CM | POA: Diagnosis not present

## 2022-02-18 DIAGNOSIS — E43 Unspecified severe protein-calorie malnutrition: Secondary | ICD-10-CM | POA: Diagnosis not present

## 2022-02-18 DIAGNOSIS — J90 Pleural effusion, not elsewhere classified: Secondary | ICD-10-CM | POA: Diagnosis not present

## 2022-02-18 DIAGNOSIS — M4856XD Collapsed vertebra, not elsewhere classified, lumbar region, subsequent encounter for fracture with routine healing: Secondary | ICD-10-CM | POA: Diagnosis not present

## 2022-02-18 DIAGNOSIS — I10 Essential (primary) hypertension: Secondary | ICD-10-CM | POA: Diagnosis not present

## 2022-02-18 DIAGNOSIS — E785 Hyperlipidemia, unspecified: Secondary | ICD-10-CM | POA: Diagnosis not present

## 2022-02-18 DIAGNOSIS — I4891 Unspecified atrial fibrillation: Secondary | ICD-10-CM | POA: Diagnosis not present

## 2022-02-19 DIAGNOSIS — J90 Pleural effusion, not elsewhere classified: Secondary | ICD-10-CM | POA: Diagnosis not present

## 2022-02-19 DIAGNOSIS — E785 Hyperlipidemia, unspecified: Secondary | ICD-10-CM | POA: Diagnosis not present

## 2022-02-19 DIAGNOSIS — I10 Essential (primary) hypertension: Secondary | ICD-10-CM | POA: Diagnosis not present

## 2022-02-19 DIAGNOSIS — I4891 Unspecified atrial fibrillation: Secondary | ICD-10-CM | POA: Diagnosis not present

## 2022-02-19 DIAGNOSIS — E43 Unspecified severe protein-calorie malnutrition: Secondary | ICD-10-CM | POA: Diagnosis not present

## 2022-02-19 DIAGNOSIS — M4856XD Collapsed vertebra, not elsewhere classified, lumbar region, subsequent encounter for fracture with routine healing: Secondary | ICD-10-CM | POA: Diagnosis not present

## 2022-02-20 DIAGNOSIS — E43 Unspecified severe protein-calorie malnutrition: Secondary | ICD-10-CM | POA: Diagnosis not present

## 2022-02-20 DIAGNOSIS — I4891 Unspecified atrial fibrillation: Secondary | ICD-10-CM | POA: Diagnosis not present

## 2022-02-20 DIAGNOSIS — M4856XD Collapsed vertebra, not elsewhere classified, lumbar region, subsequent encounter for fracture with routine healing: Secondary | ICD-10-CM | POA: Diagnosis not present

## 2022-02-20 DIAGNOSIS — E785 Hyperlipidemia, unspecified: Secondary | ICD-10-CM | POA: Diagnosis not present

## 2022-02-20 DIAGNOSIS — I10 Essential (primary) hypertension: Secondary | ICD-10-CM | POA: Diagnosis not present

## 2022-02-20 DIAGNOSIS — J90 Pleural effusion, not elsewhere classified: Secondary | ICD-10-CM | POA: Diagnosis not present

## 2022-02-21 DIAGNOSIS — E785 Hyperlipidemia, unspecified: Secondary | ICD-10-CM | POA: Diagnosis not present

## 2022-02-21 DIAGNOSIS — J90 Pleural effusion, not elsewhere classified: Secondary | ICD-10-CM | POA: Diagnosis not present

## 2022-02-21 DIAGNOSIS — I10 Essential (primary) hypertension: Secondary | ICD-10-CM | POA: Diagnosis not present

## 2022-02-21 DIAGNOSIS — M4856XD Collapsed vertebra, not elsewhere classified, lumbar region, subsequent encounter for fracture with routine healing: Secondary | ICD-10-CM | POA: Diagnosis not present

## 2022-02-21 DIAGNOSIS — E43 Unspecified severe protein-calorie malnutrition: Secondary | ICD-10-CM | POA: Diagnosis not present

## 2022-02-21 DIAGNOSIS — I4891 Unspecified atrial fibrillation: Secondary | ICD-10-CM | POA: Diagnosis not present

## 2022-02-22 DIAGNOSIS — E785 Hyperlipidemia, unspecified: Secondary | ICD-10-CM | POA: Diagnosis not present

## 2022-02-22 DIAGNOSIS — J90 Pleural effusion, not elsewhere classified: Secondary | ICD-10-CM | POA: Diagnosis not present

## 2022-02-22 DIAGNOSIS — M4856XD Collapsed vertebra, not elsewhere classified, lumbar region, subsequent encounter for fracture with routine healing: Secondary | ICD-10-CM | POA: Diagnosis not present

## 2022-02-22 DIAGNOSIS — I4891 Unspecified atrial fibrillation: Secondary | ICD-10-CM | POA: Diagnosis not present

## 2022-02-22 DIAGNOSIS — I10 Essential (primary) hypertension: Secondary | ICD-10-CM | POA: Diagnosis not present

## 2022-02-22 DIAGNOSIS — E43 Unspecified severe protein-calorie malnutrition: Secondary | ICD-10-CM | POA: Diagnosis not present

## 2022-02-23 DIAGNOSIS — J90 Pleural effusion, not elsewhere classified: Secondary | ICD-10-CM | POA: Diagnosis not present

## 2022-02-23 DIAGNOSIS — I4891 Unspecified atrial fibrillation: Secondary | ICD-10-CM | POA: Diagnosis not present

## 2022-02-23 DIAGNOSIS — E43 Unspecified severe protein-calorie malnutrition: Secondary | ICD-10-CM | POA: Diagnosis not present

## 2022-02-23 DIAGNOSIS — I10 Essential (primary) hypertension: Secondary | ICD-10-CM | POA: Diagnosis not present

## 2022-02-23 DIAGNOSIS — M4856XD Collapsed vertebra, not elsewhere classified, lumbar region, subsequent encounter for fracture with routine healing: Secondary | ICD-10-CM | POA: Diagnosis not present

## 2022-02-23 DIAGNOSIS — E785 Hyperlipidemia, unspecified: Secondary | ICD-10-CM | POA: Diagnosis not present

## 2022-02-24 DIAGNOSIS — M4856XD Collapsed vertebra, not elsewhere classified, lumbar region, subsequent encounter for fracture with routine healing: Secondary | ICD-10-CM | POA: Diagnosis not present

## 2022-02-24 DIAGNOSIS — E785 Hyperlipidemia, unspecified: Secondary | ICD-10-CM | POA: Diagnosis not present

## 2022-02-24 DIAGNOSIS — J90 Pleural effusion, not elsewhere classified: Secondary | ICD-10-CM | POA: Diagnosis not present

## 2022-02-24 DIAGNOSIS — I10 Essential (primary) hypertension: Secondary | ICD-10-CM | POA: Diagnosis not present

## 2022-02-24 DIAGNOSIS — E43 Unspecified severe protein-calorie malnutrition: Secondary | ICD-10-CM | POA: Diagnosis not present

## 2022-02-24 DIAGNOSIS — I4891 Unspecified atrial fibrillation: Secondary | ICD-10-CM | POA: Diagnosis not present

## 2022-02-25 DIAGNOSIS — E43 Unspecified severe protein-calorie malnutrition: Secondary | ICD-10-CM | POA: Diagnosis not present

## 2022-02-25 DIAGNOSIS — I10 Essential (primary) hypertension: Secondary | ICD-10-CM | POA: Diagnosis not present

## 2022-02-25 DIAGNOSIS — I4891 Unspecified atrial fibrillation: Secondary | ICD-10-CM | POA: Diagnosis not present

## 2022-02-25 DIAGNOSIS — M4856XD Collapsed vertebra, not elsewhere classified, lumbar region, subsequent encounter for fracture with routine healing: Secondary | ICD-10-CM | POA: Diagnosis not present

## 2022-02-25 DIAGNOSIS — J90 Pleural effusion, not elsewhere classified: Secondary | ICD-10-CM | POA: Diagnosis not present

## 2022-02-25 DIAGNOSIS — E785 Hyperlipidemia, unspecified: Secondary | ICD-10-CM | POA: Diagnosis not present

## 2022-02-26 DIAGNOSIS — I4891 Unspecified atrial fibrillation: Secondary | ICD-10-CM | POA: Diagnosis not present

## 2022-02-26 DIAGNOSIS — E785 Hyperlipidemia, unspecified: Secondary | ICD-10-CM | POA: Diagnosis not present

## 2022-02-26 DIAGNOSIS — E43 Unspecified severe protein-calorie malnutrition: Secondary | ICD-10-CM | POA: Diagnosis not present

## 2022-02-26 DIAGNOSIS — J90 Pleural effusion, not elsewhere classified: Secondary | ICD-10-CM | POA: Diagnosis not present

## 2022-02-26 DIAGNOSIS — I10 Essential (primary) hypertension: Secondary | ICD-10-CM | POA: Diagnosis not present

## 2022-02-26 DIAGNOSIS — M4856XD Collapsed vertebra, not elsewhere classified, lumbar region, subsequent encounter for fracture with routine healing: Secondary | ICD-10-CM | POA: Diagnosis not present

## 2022-02-27 DIAGNOSIS — M4856XD Collapsed vertebra, not elsewhere classified, lumbar region, subsequent encounter for fracture with routine healing: Secondary | ICD-10-CM | POA: Diagnosis not present

## 2022-02-27 DIAGNOSIS — E785 Hyperlipidemia, unspecified: Secondary | ICD-10-CM | POA: Diagnosis not present

## 2022-02-27 DIAGNOSIS — E43 Unspecified severe protein-calorie malnutrition: Secondary | ICD-10-CM | POA: Diagnosis not present

## 2022-02-27 DIAGNOSIS — J90 Pleural effusion, not elsewhere classified: Secondary | ICD-10-CM | POA: Diagnosis not present

## 2022-02-27 DIAGNOSIS — I10 Essential (primary) hypertension: Secondary | ICD-10-CM | POA: Diagnosis not present

## 2022-02-27 DIAGNOSIS — I4891 Unspecified atrial fibrillation: Secondary | ICD-10-CM | POA: Diagnosis not present

## 2022-02-28 DIAGNOSIS — I10 Essential (primary) hypertension: Secondary | ICD-10-CM | POA: Diagnosis not present

## 2022-02-28 DIAGNOSIS — E785 Hyperlipidemia, unspecified: Secondary | ICD-10-CM | POA: Diagnosis not present

## 2022-02-28 DIAGNOSIS — M4856XD Collapsed vertebra, not elsewhere classified, lumbar region, subsequent encounter for fracture with routine healing: Secondary | ICD-10-CM | POA: Diagnosis not present

## 2022-02-28 DIAGNOSIS — J90 Pleural effusion, not elsewhere classified: Secondary | ICD-10-CM | POA: Diagnosis not present

## 2022-02-28 DIAGNOSIS — E43 Unspecified severe protein-calorie malnutrition: Secondary | ICD-10-CM | POA: Diagnosis not present

## 2022-02-28 DIAGNOSIS — I4891 Unspecified atrial fibrillation: Secondary | ICD-10-CM | POA: Diagnosis not present

## 2022-03-01 DIAGNOSIS — M4856XD Collapsed vertebra, not elsewhere classified, lumbar region, subsequent encounter for fracture with routine healing: Secondary | ICD-10-CM | POA: Diagnosis not present

## 2022-03-01 DIAGNOSIS — E43 Unspecified severe protein-calorie malnutrition: Secondary | ICD-10-CM | POA: Diagnosis not present

## 2022-03-01 DIAGNOSIS — E785 Hyperlipidemia, unspecified: Secondary | ICD-10-CM | POA: Diagnosis not present

## 2022-03-01 DIAGNOSIS — I10 Essential (primary) hypertension: Secondary | ICD-10-CM | POA: Diagnosis not present

## 2022-03-01 DIAGNOSIS — J90 Pleural effusion, not elsewhere classified: Secondary | ICD-10-CM | POA: Diagnosis not present

## 2022-03-01 DIAGNOSIS — I4891 Unspecified atrial fibrillation: Secondary | ICD-10-CM | POA: Diagnosis not present

## 2022-03-02 DIAGNOSIS — E43 Unspecified severe protein-calorie malnutrition: Secondary | ICD-10-CM | POA: Diagnosis not present

## 2022-03-02 DIAGNOSIS — I10 Essential (primary) hypertension: Secondary | ICD-10-CM | POA: Diagnosis not present

## 2022-03-02 DIAGNOSIS — M4856XD Collapsed vertebra, not elsewhere classified, lumbar region, subsequent encounter for fracture with routine healing: Secondary | ICD-10-CM | POA: Diagnosis not present

## 2022-03-02 DIAGNOSIS — J90 Pleural effusion, not elsewhere classified: Secondary | ICD-10-CM | POA: Diagnosis not present

## 2022-03-02 DIAGNOSIS — E785 Hyperlipidemia, unspecified: Secondary | ICD-10-CM | POA: Diagnosis not present

## 2022-03-02 DIAGNOSIS — I4891 Unspecified atrial fibrillation: Secondary | ICD-10-CM | POA: Diagnosis not present

## 2022-03-09 DEATH — deceased

## 2022-05-10 ENCOUNTER — Ambulatory Visit: Payer: Medicare Other | Admitting: Podiatry
